# Patient Record
Sex: Male | Born: 1950 | Race: Black or African American | Hispanic: No | Smoking: Former smoker
Health system: Southern US, Community
[De-identification: ages and names within clinical notes are randomized; demographics above are authoritative.]

## PROBLEM LIST (undated history)

## (undated) DIAGNOSIS — E785 Hyperlipidemia, unspecified: Secondary | ICD-10-CM

## (undated) DIAGNOSIS — I10 Essential (primary) hypertension: Secondary | ICD-10-CM

## (undated) DIAGNOSIS — N183 Chronic kidney disease, stage 3 unspecified: Secondary | ICD-10-CM

## (undated) DIAGNOSIS — M1990 Unspecified osteoarthritis, unspecified site: Secondary | ICD-10-CM

## (undated) DIAGNOSIS — E119 Type 2 diabetes mellitus without complications: Secondary | ICD-10-CM

## (undated) HISTORY — DX: Unspecified osteoarthritis, unspecified site: M19.90

## (undated) HISTORY — DX: Essential (primary) hypertension: I10

## (undated) HISTORY — DX: Type 2 diabetes mellitus without complications: E11.9

## (undated) HISTORY — DX: Chronic kidney disease, stage 3 unspecified: N18.30

## (undated) HISTORY — DX: Hyperlipidemia, unspecified: E78.5

---

## 2020-04-10 DIAGNOSIS — M79604 Pain in right leg: Secondary | ICD-10-CM | POA: Insufficient documentation

## 2020-04-10 DIAGNOSIS — R600 Localized edema: Secondary | ICD-10-CM | POA: Insufficient documentation

## 2020-04-10 DIAGNOSIS — L97509 Non-pressure chronic ulcer of other part of unspecified foot with unspecified severity: Secondary | ICD-10-CM | POA: Insufficient documentation

## 2020-05-13 DIAGNOSIS — L89899 Pressure ulcer of other site, unspecified stage: Secondary | ICD-10-CM | POA: Insufficient documentation

## 2020-06-17 ENCOUNTER — Other Ambulatory Visit: Payer: Self-pay

## 2020-06-17 ENCOUNTER — Encounter: Payer: Medicare HMO | Attending: Physician Assistant | Admitting: Physician Assistant

## 2020-06-17 ENCOUNTER — Ambulatory Visit
Admission: RE | Admit: 2020-06-17 | Discharge: 2020-06-17 | Disposition: A | Discharge status: Home / Self Care | Payer: Medicare HMO | Source: Ambulatory Visit | Attending: Physician Assistant | Admitting: Physician Assistant

## 2020-06-17 ENCOUNTER — Other Ambulatory Visit: Payer: Self-pay | Admitting: Physician Assistant

## 2020-06-17 DIAGNOSIS — B999 Unspecified infectious disease: Secondary | ICD-10-CM

## 2020-06-17 DIAGNOSIS — L97522 Non-pressure chronic ulcer of other part of left foot with fat layer exposed: Secondary | ICD-10-CM | POA: Insufficient documentation

## 2020-06-17 DIAGNOSIS — L97528 Non-pressure chronic ulcer of other part of left foot with other specified severity: Secondary | ICD-10-CM | POA: Insufficient documentation

## 2020-06-17 DIAGNOSIS — E1051 Type 1 diabetes mellitus with diabetic peripheral angiopathy without gangrene: Secondary | ICD-10-CM | POA: Diagnosis not present

## 2020-06-17 DIAGNOSIS — E10621 Type 1 diabetes mellitus with foot ulcer: Secondary | ICD-10-CM | POA: Diagnosis present

## 2020-06-17 DIAGNOSIS — I10 Essential (primary) hypertension: Secondary | ICD-10-CM | POA: Insufficient documentation

## 2020-06-17 NOTE — Progress Notes (Signed)
SHIRAZ, BASTYR ([id number]) Visit Report for 06/17/2020 Chief Complaint Document Details Patient Name: William Melton, William Melton Date of Service: 06/17/2020 1:30 PM Medical Record Number: [id number] Patient Account Number: [id number] Date of Birth/Sex: 01-Nov-1950 (69 y.o. M) Treating RN: Huel Coventry Primary Care Provider: Herbert Deaner Other Clinician: Referring Provider: Referral, Self Treating Provider/Extender: Linwood Dibbles, Hisae Decoursey Weeks in Treatment: 0 Information Obtained from: Patient Chief Complaint Left 4th and 5th toe ulcers Electronic Signature(s) Signed: 06/17/2020 2:25:11 PM By: Lenda Kelp PA-C Previous Signature: 06/17/2020 2:15:04 PM Version By: Lenda Kelp PA-C Entered By: Lenda Kelp on 06/17/2020 14:25:11 William Melton ([id number]) -------------------------------------------------------------------------------- HPI Details Patient Name: William Melton Date of Service: 06/17/2020 1:30 PM Medical Record Number: [id number] Patient Account Number: [id number] Date of Birth/Sex: 1951/07/07 (68 y.o. M) Treating RN: Huel Coventry Primary Care Provider: Herbert Deaner Other Clinician: Referring Provider: Referral, Self Treating Provider/Extender: Linwood Dibbles, Lyda Colcord Weeks in Treatment: 0 History of Present Illness HPI Description: 06/17/2020 upon evaluation today patient appears to be doing unfortunately not to well in regard to his left fourth and fifth toes. Unfortunately he does have some evidence of peripheral vascular disease he was noncompressible today. He is also diabetic which makes this even more concerning to be honest. In regard to his toe he actually appears to have what appears most likely a dry gangrene issue though he does have more draining centrally but has been soaking this a lot as well. I do believe that we will get a need to address this as soon as possible I did explain to the patient there is a high likelihood of him losing this toe even with appropriate wound care  measures at this point. Obviously that is something that he really did not want to hear but nonetheless I did not want him to be misinformed about what I thought his chances were in this regard. The patient states he understands but he still hoping he does not lose the toe. In the meantime I did advise that he should discontinue using peroxide which she had already done, discontinue the use of Epson salt soaks which he tells me he would do, and I recommend that he not take a shower or bath where he is going to get this area wet he should have it covered if he is going to do either of those things obviously the bath is just out of the question altogether as I do not want him soaking this. The wrist there is infection and already I think he is at high risk for this. He does have a history of hypertension but tells me that he has no other major medical problems currently. Electronic Signature(s) Signed: 06/17/2020 2:28:01 PM By: Lenda Kelp PA-C Entered By: Lenda Kelp on 06/17/2020 14:28:01 William Melton ([id number]) -------------------------------------------------------------------------------- Physical Exam Details Patient Name: William Melton Date of Service: 06/17/2020 1:30 PM Medical Record Number: [id number] Patient Account Number: [id number] Date of Birth/Sex: Oct 31, 1950 (68 y.o. M) Treating RN: Huel Coventry Primary Care Provider: Herbert Deaner Other Clinician: Referring Provider: Referral, Self Treating Provider/Extender: STONE III, Jamel Holzmann Weeks in Treatment: 0 Constitutional sitting or standing blood pressure is within target range for patient.. pulse regular and within target range for patient.Marland Kitchen respirations regular, non- labored and within target range for patient.Marland Kitchen temperature within target range for patient.. Well-nourished and well-hydrated in no acute distress. Eyes conjunctiva clear no eyelid edema noted. pupils equal round and reactive to light and accommodation. Ears,  Nose, Mouth, and Throat no gross abnormality of ear auricles  or external auditory canals. normal hearing noted during conversation. mucus membranes moist. Respiratory normal breathing without difficulty. Cardiovascular 2+ dorsalis pedis/posterior tibialis pulses. no clubbing, cyanosis, significant edema, <3 sec cap refill. Musculoskeletal normal gait and posture. no significant deformity or arthritic changes, no loss or range of motion, no clubbing. Psychiatric this patient is able to make decisions and demonstrates good insight into disease process. Alert and Oriented x 3. pleasant and cooperative. 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 There is just a small area on the fourth toe that is somewhat eschar covered but does not appear to be doing too poorly in fact I think this is probably of little concern right now. I do believe the patient is going to require some intervention as far as x-rays are concerned I think he needs an x-ray of the foot as far as vascular is concerned I believe he needs ABIs and TBI's along with a arterial study ASAP to evaluate and see where things stand. His capillary refill appeared to be right at 3 seconds though did seem a little sluggish to me in the left foot upon evaluation today. Electronic Signature(s) Signed: 06/17/2020 2:29:24 PM By: Lenda Kelp PA-C Entered By: Lenda Kelp on 06/17/2020 14:29:22 William Melton ([id number]) -------------------------------------------------------------------------------- Physician Orders Details Patient Name: William Melton Date of Service: 06/17/2020 1:30 PM Medical Record Number: [id number] Patient Account Number: [id number] Date of Birth/Sex: [birth year] (68 y.o. M) Treating RN:  Rodell Perna Primary Care Provider: Herbert Deaner Other Clinician: Referring Provider: Referral, Self Treating Provider/Extender: Linwood Dibbles, Djuana Littleton Weeks in Treatment: 0 Verbal / Phone Orders: No Diagnosis Coding ICD-10 Coding Code Description E11.621 Type 2 diabetes mellitus with foot ulcer L97.522 Non-pressure chronic ulcer of other part of left foot with fat layer exposed L97.528 Non-pressure chronic ulcer of other part of left foot with other specified severity I10 Essential (primary) hypertension Wound Cleansing Wound #1 Left Toe Fifth o Clean wound with Normal Saline. Wound #2 Left Toe Fourth o Clean wound with Normal Saline. Primary Wound Dressing Wound #1 Left Toe Fifth o Dry Gauze o Other: - Betadine Wound #2 Left Toe Fourth o Dry Gauze o Other: - Betadine Dressing Change Frequency Wound #1 Left Toe Fifth o Change dressing every day. Wound #2 Left Toe Fourth o Change dressing every day. Follow-up Appointments Wound #1 Left Toe Fifth o Return Appointment in 1 week. Wound #2 Left Toe Fourth o Return Appointment in 1 week. Consults o Vascular - bilateral ABI and TBI ASAP Radiology o X-ray, foot - left foot Electronic Signature(s) Signed: 06/17/2020 3:52:22 PM By: Rodell Perna Signed: 06/17/2020 4:23:41 PM By: Lenda Kelp PA-C Entered By: Rodell Perna on 06/17/2020 14:23:07 William Melton ([id number]) -------------------------------------------------------------------------------- Problem List Details Patient Name: William Melton Date of Service: 06/17/2020 1:30 PM Medical Record Number: [id number] Patient Account Number: [id number] Date of Birth/Sex: [birth year] (68 y.o. M) Treating RN: Huel Coventry Primary Care Provider: Herbert Deaner Other Clinician: Referring Provider: Referral, Self Treating Provider/Extender: Linwood Dibbles, Haiven Nardone Weeks in Treatment: 0 Active Problems ICD-10 Encounter Code Description Active Date  MDM Diagnosis E11.621 Type 2 diabetes mellitus with foot ulcer 06/17/2020 No Yes L97.522 Non-pressure chronic ulcer of other part of left foot with fat layer 06/17/2020 No Yes exposed L97.528 Non-pressure chronic  ulcer of other part of left foot with other specified 06/17/2020 No Yes severity I10 Essential (primary) hypertension 06/17/2020 No Yes Inactive Problems Resolved Problems Electronic Signature(s) Signed: 06/17/2020 2:14:48 PM By: Lenda KelpStone III, Richy Spradley PA-C Entered By: Lenda KelpStone III, Gladys Gutman on 06/17/2020 14:14:48 William Melton, Niranjan ([id number]) -------------------------------------------------------------------------------- Progress Note Details Patient Name: William Melton, William Melton Date of Service: 06/17/2020 1:30 PM Medical Record Number: [id number] Patient Account Number: [id number] Date of Birth/Sex: [birth year] (68 y.o. M) Treating RN: Huel CoventryWoody, Kim Primary Care Provider: Herbert DeanerHARRIS, MEREDITH Other Clinician: Referring Provider: Referral, Self Treating Provider/Extender: Linwood DibblesSTONE III, Ascencion Stegner Weeks in Treatment: 0 Subjective Chief Complaint Information obtained from Patient Left 4th and 5th toe ulcers History of Present Illness (HPI) 06/17/2020 upon evaluation today patient appears to be doing unfortunately not to well in regard to his left fourth and fifth toes. Unfortunately he does have some evidence of peripheral vascular disease he was noncompressible today. He is also diabetic which makes this even more concerning to be honest. In regard to his toe he actually appears to have what appears most likely a dry gangrene issue though he does have more draining centrally but has been soaking this a lot as well. I do believe that we will get a need to address this as soon as possible I did explain to the patient there is a high likelihood of him losing this toe even with appropriate wound care measures at this point. Obviously that is something that he really did not want to hear but nonetheless I did not want him  to be misinformed about what I thought his chances were in this regard. The patient states he understands but he still hoping he does not lose the toe. In the meantime I did advise that he should discontinue using peroxide which she had already done, discontinue the use of Epson salt soaks which he tells me he would do, and I recommend that he not take a shower or bath where he is going to get this area wet he should have it covered if he is going to do either of those things obviously the bath is just out of the question altogether as I do not want him soaking this. The wrist there is infection and already I think he is at high risk for this. He does have a history of hypertension but tells me that he has no other major medical problems currently. Patient History Information obtained from Patient. Allergies No Known Allergies Family History Cancer - Father,Siblings, Hypertension - Father,Siblings, No family history of Diabetes, Heart Disease, Hereditary Spherocytosis, Kidney Disease, Lung Disease, Seizures, Stroke, Thyroid Problems, Tuberculosis. Social History Current every day smoker - 40 years, Marital Status - Widowed, Alcohol Use - Rarely, Drug Use - No History, Caffeine Use - Daily. Medical History Cardiovascular Patient has history of Hypertension Denies history of Angina, Arrhythmia, Congestive Heart Failure, Coronary Artery Disease, Deep Vein Thrombosis, Hypotension, Myocardial Infarction, Peripheral Arterial Disease, Peripheral Venous Disease, Phlebitis, Vasculitis Endocrine Patient has history of Type I Diabetes Musculoskeletal Patient has history of Gout Patient is treated with Insulin. Blood sugar is not tested. Review of Systems (ROS) Constitutional Symptoms (General Health) Denies complaints or symptoms of Fatigue, Fever, Chills, Marked Weight Change. Eyes Denies complaints or symptoms of Dry Eyes, Vision Changes, Glasses / Contacts. Ear/Nose/Mouth/Throat Denies  complaints or symptoms of Difficult clearing ears, Sinusitis. Hematologic/Lymphatic Denies complaints or symptoms of Bleeding / Clotting Disorders, Human Immunodeficiency Virus. Respiratory Denies complaints or symptoms of Chronic or frequent coughs, Shortness of Breath. Cardiovascular Denies complaints  or symptoms of Chest pain, LE edema. Gastrointestinal Denies complaints or symptoms of Frequent diarrhea, Nausea, Vomiting. Endocrine Denies complaints or symptoms of Hepatitis, Thyroid disease, Polydypsia (Excessive Thirst). Genitourinary Denies complaints or symptoms of Kidney failure/ Dialysis, Incontinence/dribbling. Immunological MERRIT, FRIESEN ([id number]) Denies complaints or symptoms of Hives, Itching. Integumentary (Skin) Complains or has symptoms of Wounds. Denies complaints or symptoms of Bleeding or bruising tendency, Breakdown, Swelling. Musculoskeletal Denies complaints or symptoms of Muscle Pain, Muscle Weakness. Neurologic Denies complaints or symptoms of Numbness/parasthesias, Focal/Weakness. Psychiatric Denies complaints or symptoms of Anxiety, Claustrophobia. Objective Constitutional sitting or standing blood pressure is within target range for patient.. pulse regular and within target range for patient.Marland Kitchen respirations regular, non- labored and within target range for patient.Marland Kitchen temperature within target range for patient.. Well-nourished and well-hydrated in no acute distress. Vitals Time Taken: 1:45 PM, Height: 72 in, Weight: 202 lbs, BMI: 27.4, Temperature: 97.8 F, Pulse: 81 bpm, Respiratory Rate: 16 breaths/min, Blood Pressure: 114/77 mmHg. Eyes conjunctiva clear no eyelid edema noted. pupils equal round and reactive to light and accommodation. Ears, Nose, Mouth, and Throat no gross abnormality of ear auricles or external auditory canals. normal hearing noted during conversation. mucus membranes moist. Respiratory normal breathing without  difficulty. Cardiovascular 2+ dorsalis pedis/posterior tibialis pulses. no clubbing, cyanosis, significant edema, Musculoskeletal normal gait and posture. no significant deformity or arthritic changes, no loss or range of motion, no clubbing. Psychiatric this patient is able to make decisions and demonstrates good insight into disease process. Alert and Oriented x 3. pleasant and cooperative. General Notes: Upon inspection patient appears to have mostly dry gangrene of the fifth toe on the left foot although there is some moisture medially I do not see any signs of this worsening dramatically as of yet but I am still concerned about the possibility of a deeper infection. There is just a small area on the fourth toe that is somewhat eschar covered but does not appear to be doing too poorly in fact I think this is probably of little concern right now. I do believe the patient is going to require some intervention as far as x-rays are concerned I think he needs an x-ray of the foot as far as vascular is concerned I believe he needs ABIs and TBI's along with a arterial study ASAP to evaluate and see where things stand. His capillary refill appeared to be right at 3 seconds though did seem a little sluggish to me in the left foot upon evaluation today. Integumentary (Hair, Skin) Wound #1 status is Open. Original cause of wound was Blister. The wound is located on the Left Toe Fifth. The wound measures 2.5cm length x 3.4cm width x 0.1cm depth; 6.676cm^2 area and 0.668cm^3 volume. There is Fat Layer (Subcutaneous Tissue) exposed. There is no tunneling or undermining noted. There is a medium amount of purulent drainage noted. The wound margin is indistinct and nonvisible. There is no granulation within the wound bed. There is a large (67-100%) amount of necrotic tissue within the wound bed including Eschar and Adherent Slough. Wound #2 status is Open. Original cause of wound was Blister. The wound is  located on the Left Toe Fourth. The wound measures 0.2cm length x 0.3cm width x 0.1cm depth; 0.047cm^2 area and 0.005cm^3 volume. There is Fat Layer (Subcutaneous Tissue) exposed. There is no tunneling or undermining noted. There is a medium amount of serosanguineous drainage noted. The wound margin is flat and intact. There is no granulation within the wound bed. There is a  large (67-100%) amount of necrotic tissue within the wound bed including Eschar. Assessment Active Problems ICD-10 Type 2 diabetes mellitus with foot ulcer Non-pressure chronic ulcer of other part of left foot with fat layer exposed Non-pressure chronic ulcer of other part of left foot with other specified severity William Melton ([id number]) Essential (primary) hypertension Plan Wound Cleansing: Wound #1 Left Toe Fifth: Clean wound with Normal Saline. Wound #2 Left Toe Fourth: Clean wound with Normal Saline. Primary Wound Dressing: Wound #1 Left Toe Fifth: Dry Gauze Other: - Betadine Wound #2 Left Toe Fourth: Dry Gauze Other: - Betadine Dressing Change Frequency: Wound #1 Left Toe Fifth: Change dressing every day. Wound #2 Left Toe Fourth: Change dressing every day. Follow-up Appointments: Wound #1 Left Toe Fifth: Return Appointment in 1 week. Wound #2 Left Toe Fourth: Return Appointment in 1 week. Radiology ordered were: X-ray, foot - left foot Consults ordered were: Vascular - bilateral ABI and TBI ASAP 1. I would recommend at this time that we initiate treatment with Betadine painting the toes as well as dry gauze being utilized to try to keep the area as dry as possible this should be done at least once a day twice a day if needed. 2. I am also can recommend at this time that the patient needs to cover the area with dry gauze following in order to ensure this does not get dirty or calls a greater risk for infection going forward. 3. I am also can recommend at this time that the patient needs to wear  the surgical shoe in order to prevent any additional damage to the toe. 4. He should continue and complete his doxycycline in my opinion. 5. I am sending him for an x-ray today of the left foot to evaluate for any bony abnormalities related to osteomyelitis. 6. I am also going to recommend that the patient as soon as possible have arterial studies with ABI and TBI. I am going to see if we can get this scheduled ASAP for the patient. We will see patient back for reevaluation in 1 week here in the clinic. If anything worsens or changes patient will contact our office for additional recommendations. Electronic Signature(s) Signed: 06/17/2020 2:31:26 PM By: Lenda Kelp PA-C Entered By: Lenda Kelp on 06/17/2020 14:31:25 William Melton ([id number]) -------------------------------------------------------------------------------- ROS/PFSH Details Patient Name: William Melton Date of Service: 06/17/2020 1:30 PM Medical Record Number: [id number] Patient Account Number: [id number] Date of Birth/Sex: [birth year] (68 y.o. M) Treating RN: Rodell Perna Primary Care Provider: Herbert Deaner Other Clinician: Referring Provider: Referral, Self Treating Provider/Extender: STONE III, Lauriana Denes Weeks in Treatment: 0 Information Obtained From Patient Constitutional Symptoms (General Health) Complaints and Symptoms: Negative for: Fatigue; Fever; Chills; Marked Weight Change Eyes Complaints and Symptoms: Negative for: Dry Eyes; Vision Changes; Glasses / Contacts Ear/Nose/Mouth/Throat Complaints and Symptoms: Negative for: Difficult clearing ears; Sinusitis Hematologic/Lymphatic Complaints and Symptoms: Negative for: Bleeding / Clotting Disorders; Human Immunodeficiency Virus Respiratory Complaints and Symptoms: Negative for: Chronic or frequent coughs; Shortness of Breath Cardiovascular Complaints and Symptoms: Negative for: Chest pain; LE edema Medical History: Positive for: Hypertension Negative  for: Angina; Arrhythmia; Congestive Heart Failure; Coronary Artery Disease; Deep Vein Thrombosis; Hypotension; Myocardial Infarction; Peripheral Arterial Disease; Peripheral Venous Disease; Phlebitis; Vasculitis Gastrointestinal Complaints and Symptoms: Negative for: Frequent diarrhea; Nausea; Vomiting Endocrine Complaints and Symptoms: Negative for: Hepatitis; Thyroid disease; Polydypsia (Excessive Thirst) Medical History: Positive for: Type I Diabetes Time with diabetes: 20 years Treated with: Insulin Blood sugar tested every day: No Genitourinary Complaints  and Symptoms: Negative for: Kidney failure/ Dialysis; Incontinence/dribbling [place], Peyton Najjar ([id number]) Immunological Complaints and Symptoms: Negative for: Hives; Itching Integumentary (Skin) Complaints and Symptoms: Positive for: Wounds Negative for: Bleeding or bruising tendency; Breakdown; Swelling Musculoskeletal Complaints and Symptoms: Negative for: Muscle Pain; Muscle Weakness Medical History: Positive for: Gout Neurologic Complaints and Symptoms: Negative for: Numbness/parasthesias; Focal/Weakness Psychiatric Complaints and Symptoms: Negative for: Anxiety; Claustrophobia Oncologic Immunizations Pneumococcal Vaccine: Received Pneumococcal Vaccination: No Implantable Devices None Family and Social History Cancer: Yes - Father,Siblings; Diabetes: No; Heart Disease: No; Hereditary Spherocytosis: No; Hypertension: Yes - Father,Siblings; Kidney Disease: No; Lung Disease: No; Seizures: No; Stroke: No; Thyroid Problems: No; Tuberculosis: No; Current every day smoker - 40 years; Marital Status - Widowed; Alcohol Use: Rarely; Drug Use: No History; Caffeine Use: Daily; Financial Concerns: No; Food, Clothing or Shelter Needs: No; Support System Lacking: No; Transportation Concerns: No Electronic Signature(s) Signed: 06/17/2020 3:52:22 PM By: Rodell Perna Signed: 06/17/2020 4:23:41 PM By: Lenda Kelp PA-C Entered  By: Rodell Perna on 06/17/2020 13:59:51 William Melton ([id number]) -------------------------------------------------------------------------------- SuperBill Details Patient Name: William Melton Date of Service: 06/17/2020 Medical Record Number: [id number] Patient Account Number: [id number] Date of Birth/Sex: [birth year] (69 y.o. M) Treating RN: Huel Coventry Primary Care Provider: Herbert Deaner Other Clinician: Referring Provider: Referral, Self Treating Provider/Extender: Linwood Dibbles, Imberly Troxler Weeks in Treatment: 0 Diagnosis Coding ICD-10 Codes Code Description E11.621 Type 2 diabetes mellitus with foot ulcer L97.522 Non-pressure chronic ulcer of other part of left foot with fat layer exposed L97.528 Non-pressure chronic ulcer of other part of left foot with other specified severity I10 Essential (primary) hypertension Facility Procedures CPT4 Code: 08657846 Description: 99214 - WOUND CARE VISIT-LEV 4 EST PT Modifier: Quantity: 1 Physician Procedures CPT4 Code: 9629528 Description: 99204 - WC PHYS LEVEL 4 - NEW PT Modifier: Quantity: 1 CPT4 Code: Description: ICD-10 Diagnosis Description E11.621 Type 2 diabetes mellitus with foot ulcer L97.522 Non-pressure chronic ulcer of other part of left foot with fat layer expo L97.528 Non-pressure chronic ulcer of other part of left foot with other specifie  I10 Essential (primary) hypertension Modifier: sed d severity Quantity: Electronic Signature(s) Signed: 06/17/2020 2:31:42 PM By: Lenda Kelp PA-C Entered By: Lenda Kelp on 06/17/2020 14:31:42

## 2020-06-17 NOTE — Progress Notes (Signed)
[name], CUFF ([id number]) Visit Report for 06/17/2020 Allergy List Details Patient Name: William Melton, William Melton Date of Service: 06/17/2020 1:30 PM Medical Record Number: [id number] Patient Account Number: [id number] Date of Birth/Sex: November 02, 1950 (68 y.o. M) Treating RN: Rodell Perna Primary Care Chantille Navarrete: Herbert Deaner Other Clinician: Referring Adalberto Metzgar: Referral, Self Treating Jeffry Vogelsang/Extender: STONE III, HOYT Weeks in Treatment: 0 Allergies Active Allergies No Known Allergies Allergy Notes Electronic Signature(s) Signed: 06/17/2020 3:52:22 PM By: Rodell Perna Entered By: Rodell Perna on 06/17/2020 13:56:36 William Melton ([id number]) -------------------------------------------------------------------------------- Arrival Information Details Patient Name: William Melton Date of Service: 06/17/2020 1:30 PM Medical Record Number: [id number] Patient Account Number: [id number] Date of Birth/Sex: [birth year] (68 y.o. M) Treating RN: Huel Coventry Primary Care Jackelyne Sayer: Herbert Deaner Other Clinician: Referring Merrianne Mccumbers: Referral, Self Treating Matthan Sledge/Extender: Linwood Dibbles, HOYT Weeks in Treatment: 0 Visit Information Patient Arrived: Cane Arrival Time: 13:48 Accompanied By: self Transfer Assistance: None Patient Identification Verified: Yes Secondary Verification Process Completed: Yes Electronic Signature(s) Signed: 06/17/2020 2:57:07 PM By: Dayton Martes RCP, RRT, CHT Entered By: Dayton Martes on 06/17/2020 13:49:00 William Melton ([id number]) -------------------------------------------------------------------------------- Clinic Level of Care Assessment Details Patient Name: William Melton Date of Service: 06/17/2020 1:30 PM Medical Record Number: [id number] Patient Account Number: [id number] Date of Birth/Sex: [birth year] (68 y.o. M) Treating RN: Rodell Perna Primary Care Irem Stoneham: Herbert Deaner Other Clinician: Referring Pattye Meda: Referral,  Self Treating Darran Gabay/Extender: STONE III, HOYT Weeks in Treatment: 0 Clinic Level of Care Assessment Items TOOL 2 Quantity Score []  - Use when only an EandM is performed on the INITIAL visit 0 ASSESSMENTS - Nursing Assessment / Reassessment X - General Physical Exam (combine w/ comprehensive assessment (listed just below) when performed on new 1 20 pt. evals) X- 1 25 Comprehensive Assessment (HX, ROS, Risk Assessments, Wounds Hx, etc.) ASSESSMENTS - Wound and Skin Assessment / Reassessment []  - Simple Wound Assessment / Reassessment - one wound 0 X- 2 5 Complex Wound Assessment / Reassessment - multiple wounds []  - 0 Dermatologic / Skin Assessment (not related to wound area) ASSESSMENTS - Ostomy and/or Continence Assessment and Care []  - Incontinence Assessment and Management 0 []  - 0 Ostomy Care Assessment and Management (repouching, etc.) PROCESS - Coordination of Care X - Simple Patient / Family Education for ongoing care 1 15 []  - 0 Complex (extensive) Patient / Family Education for ongoing care []  - 0 Staff obtains , Records, Test Results / Process Orders []  - 0 Staff telephones HHA, Nursing Homes / Clarify orders / etc []  - 0 Routine Transfer to another Facility (non-emergent condition) []  - 0 Routine Hospital Admission (non-emergent condition) X- 1 15 New Admissions / / Ordering NPWT, Apligraf, etc. []  - 0 Emergency Hospital Admission (emergent condition) X- 1 10 Simple Discharge Coordination []  - 0 Complex (extensive) Discharge Coordination PROCESS - Special Needs []  - Pediatric / Minor Patient Management 0 []  - 0 Isolation Patient Management []  - 0 Hearing / Language / Visual special needs []  - 0 Assessment of Community assistance (transportation, D/C planning, etc.) []  - 0 Additional assistance / Altered mentation []  - 0 Support Surface(s) Assessment (bed, cushion, seat, etc.) INTERVENTIONS - Wound Cleansing /  Measurement X - Wound Imaging (photographs - any number of wounds) 1 5 []  - 0 Wound Tracing (instead of photographs) []  - 0 Simple Wound Measurement - one wound X- 2 5 Complex Wound Measurement - multiple wounds ( ) []  - 0 Simple Wound Cleansing - one wound X- 2 5 Complex Wound Cleansing - multiple  wounds INTERVENTIONS - Wound Dressings []  - Small Wound Dressing one or multiple wounds 0 X- 2 15 Medium Wound Dressing one or multiple wounds []  - 0 Large Wound Dressing one or multiple wounds []  - 0 Application of Medications - injection INTERVENTIONS - Miscellaneous []  - External ear exam 0 []  - 0 Specimen Collection (cultures, biopsies, blood, body fluids, etc.) []  - 0 Specimen(s) / Culture(s) sent or taken to Lab for analysis []  - 0 Patient Transfer (multiple staff / Lift / Similar devices) []  - 0 Simple Staple / Suture removal (25 or less) []  - 0 Complex Staple / Suture removal (26 or more) []  - 0 Hypo / Hyperglycemic Management (close monitor of Blood Glucose) []  - 0 Ankle / Brachial Index (ABI) - do not check if billed separately Has the patient been seen at the hospital within the last three years: Yes Total Score: 150 Level Of Care: New/Established - Level 4 Electronic Signature(s) Signed: 06/17/2020 3:52:22 PM By: Entered By: on 06/17/2020 14:23:38 ( ) -------------------------------------------------------------------------------- Encounter Discharge Information Details Patient Name: William Melton Date of Service: 06/17/2020 1:30 PM Medical Record Number: Patient Account Number: Date of Birth/Sex: [birth year] (68 y.o. M) Treating RN: Rodell Perna Primary Care Willett Lefeber: Rodell Perna Other Clinician: Referring Rhenda [place]: Referral, Self Treating Primrose Oler/Extender: 08/17/2020, HOYT Weeks in Treatment: 0 Encounter Discharge Information Items Discharge Condition:  Stable Ambulatory Status: Cane Discharge Destination: Home Transportation: Private Auto Accompanied By: self Schedule Follow-up Appointment: Yes Clinical Summary of Care: Electronic Signature(s) Signed: 06/17/2020 3:52:22 PM By: [id number] Entered By: William Melton on 06/17/2020 14:24:26 [id number] ([id number]) -------------------------------------------------------------------------------- Lower Extremity Assessment Details Patient Name: 08/29/1951 Date of Service: 06/17/2020 1:30 PM Medical Record Number: Rodell Perna Patient Account Number: Herbert Deaner Date of Birth/Sex: 1950-11-13 (68 y.o. M) Treating RN: Rodell Perna Primary Care Marcus Schwandt: Rodell Perna Other Clinician: Referring Nava Song: Referral, Self Treating Wiliam Cauthorn/Extender: STONE III, HOYT Weeks in Treatment: 0 Edema Assessment Assessed: [Left: No] [Right: No] Edema: [Left: Yes] [Right: No] Calf Left: Right: Point of Measurement: 30 cm From Medial Instep 33 cm 31 cm Ankle Left: Right: Point of Measurement: 10 cm From Medial Instep 22 cm 19 cm Vascular Assessment Pulses: Dorsalis Pedis Palpable: [Left:Yes] [Right:Yes] Blood Pressure: Brachial: [Right:114] Ankle: [Right:Dorsalis Pedis: 110 0.96] Notes Left leg [place]>220 Electronic Signature(s) Signed: 06/17/2020 3:52:22 PM By: William Melton Entered By: [id number] on 06/17/2020 14:10:50 08/17/2020 ([id number]) -------------------------------------------------------------------------------- Multi Wound Chart Details Patient Name: [id number] Date of Service: 06/17/2020 1:30 PM Medical Record Number: 06-04-1989 Patient Account Number: Rodell Perna Date of Birth/Sex: 10-Apr-1951 (68 y.o. 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 Extremity Comorbid History: Hypertension, Type I Diabetes, Hypertension, Type I Diabetes, N/A Gout Gout Date Acquired: 06/03/2020 06/03/2020 N/A Weeks of Treatment: 0 0 N/A Wound Status: Open Open N/A Pending Amputation on Yes No N/A Presentation: Measurements L x W x D (cm) 2.5x3.4x0.1 0.2x0.3x0.1 N/A Area (cm) : 6.676 0.047 N/A Volume (cm) : 0.668 0.005 N/A Classification: Unable to visualize wound bed Grade 2 N/A Exudate Amount: Medium Medium N/A Exudate Type: Purulent Serosanguineous  N/A Exudate Color: yellow, brown, green red, brown N/A Wound Margin: Indistinct, nonvisible Flat and Intact N/A Granulation Amount: None Present (0%) None Present (0%) N/A Necrotic Amount: Large (67-100%) Large (67-100%) N/A Necrotic Tissue: Eschar, Adherent Slough Eschar N/A Exposed Structures: Fat Layer (Subcutaneous Tissue): Fat Layer (Subcutaneous Tissue): N/A Yes Yes Fascia: No Fascia: No Tendon: No Tendon: No Muscle: No Muscle: No Joint: No Joint: No Bone: No Bone: No Epithelialization: None None N/A Treatment Notes Electronic Signature(s) Signed: 06/17/2020 3:52:22 PM By: Rodell Perna Entered By: Rodell Perna on 06/17/2020 14:16:41 William Melton ([id number]) -------------------------------------------------------------------------------- Multi-Disciplinary Care Plan Details Patient Name: William Melton Date of Service: 06/17/2020 1:30 PM Medical Record Number: [id number] Patient Account Number: [id number] Date of Birth/Sex: [birth year] (68 y.o. M) Treating RN: Rodell Perna Primary Care Ohm Dentler: Herbert Deaner Other Clinician: Referring Ken Bonn: Referral, Self Treating  Latoy Labriola/Extender: Linwood Dibbles, HOYT Weeks in Treatment: 0 Active Inactive Necrotic Tissue Nursing Diagnoses: Impaired tissue integrity related to necrotic/devitalized tissue Goals: Patient/caregiver will verbalize understanding of reason and process for debridement of necrotic tissue Date Initiated: 06/17/2020 Target Resolution Date: 07/11/2020 Goal Status: Active Interventions: Assess patient pain level pre-, during and post procedure and prior to discharge Notes: Orientation to the Wound Care Program Nursing Diagnoses: Knowledge deficit related to the wound healing center program Goals: Patient/caregiver will verbalize understanding of the Wound Healing Center Program Date Initiated: 06/17/2020 Target Resolution Date: 07/11/2020 Goal Status: Active Interventions: Provide education on orientation to the wound center Notes: Wound/Skin Impairment Nursing Diagnoses: Impaired tissue integrity Goals: Ulcer/skin breakdown will have a volume reduction of 30% by week 4 Date Initiated: 06/17/2020 Target Resolution Date: 07/12/2020 Goal Status: Active Interventions: Assess ulceration(s) every visit Notes: Electronic Signature(s) Signed: 06/17/2020 3:52:22 PM By: Rodell Perna Entered By: Rodell Perna on 06/17/2020 14:16:26 William Melton ([id number]) -------------------------------------------------------------------------------- Pain Assessment Details Patient Name: William Melton Date of Service: 06/17/2020 1:30 PM Medical Record Number: [id number] Patient Account Number: [id number] Date of Birth/Sex: 16-Nov-1950 (69 y.o. M) Treating RN: Huel Coventry Primary Care Taariq Leitz: Herbert Deaner Other Clinician: Referring Tiffine Henigan: Referral, Self Treating Jonathan Kirkendoll/Extender: STONE III, HOYT Weeks in Treatment: 0 Active Problems Location of Pain Severity and Description of Pain Patient Has Paino Yes Site Locations Rate the pain. Current Pain Level: 5 Pain Management and  Medication Current Pain Management: Electronic Signature(s) Signed: 06/17/2020 2:57:07 PM By: Dayton Martes RCP, RRT, CHT Signed: 06/17/2020 4:42:00 PM By: Elliot Gurney, BSN, RN, CWS, Kim RN, BSN Entered By: Dayton Martes on 06/17/2020 13:49:21 William Melton ([id number]) -------------------------------------------------------------------------------- Patient/Caregiver Education Details Patient Name: William Melton Date of Service: 06/17/2020 1:30 PM Medical Record Number: [id number] Patient Account Number: [id number] Date of Birth/Gender: 07-16-1951 (68 y.o. M) Treating RN: Rodell Perna Primary Care Physician: Herbert Deaner Other Clinician: Referring Physician: Referral, Self Treating Physician/Extender: Linwood Dibbles, HOYT Weeks in Treatment: 0 Education Assessment Education Provided To: Patient Education Topics Provided Wound/Skin Impairment: Handouts: Caring for Your Ulcer Methods: Demonstration, Explain/Verbal Responses: State content correctly Electronic Signature(s) Signed: 06/17/2020 3:52:22 PM By: Rodell Perna Entered By: Rodell Perna on 06/17/2020 14:23:50 William Melton ([id number]) -------------------------------------------------------------------------------- Wound Assessment Details Patient Name: William Melton Date of Service: 06/17/2020 1:30 PM Medical Record Number: [id number] Patient Account Number: [id number] Date of Birth/Sex: 16-Sep-1950 (68 y.o. M) Treating RN: Rodell Perna Primary Care Abena Erdman: Herbert Deaner Other Clinician: Referring Merline Perkin: Referral, Self Treating Aniel Hubble/Extender: STONE III, HOYT Weeks in Treatment: 0 Wound Status Wound Number: 1 Primary Etiology: Diabetic Wound/Ulcer of the Lower Extremity Wound Location: Left Toe Fifth Wound Status: Open Wounding Event: Blister  Comorbid History: Hypertension, Type I Diabetes, Gout Date Acquired: 05/03/2020 Weeks Of Treatment: 0 Clustered Wound: No Pending Amputation On  Presentation Photos Wound Measurements Length: (cm) 2.5 % R Width: (cm) 3.4 % R Depth: (cm) 0.1 Epi Area: (cm) 6.676 Tu Volume: (cm) 0.668 Un eduction in Area: 0% eduction in Volume: 0% thelialization: None nneling: No dermining: No Wound Description Classification: Grade 4 Fou Wound Margin: Indistinct, nonvisible Slo Exudate Amount: Medium Exudate Type: Purulent Exudate Color: yellow, brown, green l Odor After Cleansing: No ugh/Fibrino Yes Wound Bed Granulation Amount: None Present (0%) Exposed Structure Necrotic Amount: Large (67-100%) Fascia Exposed: No Necrotic Quality: Eschar, Adherent Slough Fat Layer (Subcutaneous Tissue) Exposed: Yes Tendon Exposed: No Muscle Exposed: No Joint Exposed: No Bone Exposed: No Treatment Notes Wound #1 (Left Toe Fifth) Notes betadine, gauze William SergeantJONES, Braxton ([id number]) Electronic Signature(s) Signed: 06/17/2020 2:26:39 PM By: Lenda KelpStone III, Hoyt PA-C Signed: 06/17/2020 3:52:22 PM By: Rodell PernaScott, Dajea Entered By: Lenda KelpStone III, Hoyt on 06/17/2020 14:26:38 William SergeantJONES, Xander ([id number]) -------------------------------------------------------------------------------- Wound Assessment Details Patient Name: William SergeantJONES, Grae Date of Service: 06/17/2020 1:30 PM Medical Record Number: [id number] Patient Account Number: [id number] Date of Birth/Sex: [birth year] (68 y.o. M) Treating RN: Rodell PernaScott, Dajea Primary Care Jadesola Poynter: Herbert DeanerHARRIS, MEREDITH Other Clinician: Referring Knut Rondinelli: Referral, Self Treating Yuridiana Formanek/Extender: STONE III, HOYT Weeks in Treatment: 0 Wound Status Wound Number: 2 Primary Etiology: Diabetic Wound/Ulcer of the Lower Extremity Wound Location: Left Toe Third Wound Status: Open Wounding Event: Blister Comorbid History: Hypertension, Type I Diabetes, Gout Date Acquired: 06/03/2020 Weeks Of Treatment: 0 Clustered Wound: No Photos Wound Measurements Length: (cm) 0.2 Width: (cm) 0.3 Depth: (cm) 0.1 Area: (cm) 0.047 Volume: (cm) 0.005 %  Reduction in Area: % Reduction in Volume: Epithelialization: None Tunneling: No Undermining: No Wound Description Classification: Grade 2 Wound Margin: Flat and Intact Exudate Amount: Medium Exudate Type: Serosanguineous Exudate Color: red, brown Foul Odor After Cleansing: No Slough/Fibrino Yes Wound Bed Granulation Amount: None Present (0%) Exposed Structure Necrotic Amount: Large (67-100%) Fascia Exposed: No Necrotic Quality: Eschar Fat Layer (Subcutaneous Tissue) Exposed: Yes Tendon Exposed: No Muscle Exposed: No Joint Exposed: No Bone Exposed: No Electronic Signature(s) Signed: 06/17/2020 3:52:22 PM By: Rodell PernaScott, Dajea Entered By: Rodell PernaScott, Dajea on 06/17/2020 14:05:08 William SergeantJONES, Nevan ([id number]) -------------------------------------------------------------------------------- Vitals Details Patient Name: William SergeantJONES, Avondre Date of Service: 06/17/2020 1:30 PM Medical Record Number: [id number] Patient Account Number: [id number] Date of Birth/Sex: [birth year] (69 y.o. M) Treating RN: Huel CoventryWoody, Kim Primary Care Camala Talwar: Herbert DeanerHARRIS, MEREDITH Other Clinician: Referring Teyana Pierron: Referral, Self Treating Desree Leap/Extender: STONE III, HOYT Weeks in Treatment: 0 Vital Signs Time Taken: 13:45 Temperature (F): 97.8 Height (in): 72 Pulse (bpm): 81 Weight (lbs): 202 Respiratory Rate (breaths/min): 16 Body Mass Index (BMI): 27.4 Blood Pressure (mmHg): 114/77 Reference Range: 80 - 120 mg / dl Electronic Signature(s) Signed: 06/17/2020 2:57:07 PM By: Dayton MartesWallace, RCP,RRT,CHT, Sallie RCP, RRT, CHT Entered By: Dayton MartesWallace, RCP,RRT,CHT, Sallie on 06/17/2020 13:50:01

## 2020-06-17 NOTE — Progress Notes (Signed)
AYEDEN, GLADMAN ([id number]) Visit Report for 06/17/2020 Abuse/Suicide Risk Screen Details Patient Name: William Melton, William Melton Date of Service: 06/17/2020 1:30 PM Medical Record Number: [id number] Patient Account Number: [id number] Date of Birth/Sex: 09/08/1950 (68 y.o. M) Treating RN: Rodell Perna Primary Care Caymen Dubray: Herbert Deaner Other Clinician: Referring Itzae Mccurdy: Referral, Self Treating Josphine Laffey/Extender: STONE III, HOYT Weeks in Treatment: 0 Abuse/Suicide Risk Screen Items Answer ABUSE RISK SCREEN: Has anyone close to you tried to hurt or harm you recentlyo No Do you feel uncomfortable with anyone in your familyo No Has anyone forced you do things that you didnot want to doo No Electronic Signature(s) Signed: 06/17/2020 3:52:22 PM By: Rodell Perna Entered By: Rodell Perna on 06/17/2020 14:00:00 William Melton ([id number]) -------------------------------------------------------------------------------- Activities of Daily Living Details Patient Name: William Melton Date of Service: 06/17/2020 1:30 PM Medical Record Number: [id number] Patient Account Number: [id number] Date of Birth/Sex: 02-03-1951 (68 y.o. M) Treating RN: Rodell Perna Primary Care Berda Shelvin: Herbert Deaner Other Clinician: Referring Anetta Olvera: Referral, Self Treating Lylia Karn/Extender: STONE III, HOYT Weeks in Treatment: 0 Activities of Daily Living Items Answer Activities of Daily Living (Please select one for each item) Drive Automobile Completely Able Take Medications Completely Able Use Telephone Completely Able Care for Appearance Completely Able Use Toilet Completely Able Bath / Shower Completely Able Dress Self Completely Able Feed Self Completely Able Walk Completely Able Get In / Out Bed Completely Able Housework Completely Able Prepare Meals Completely Able Handle Money Completely Able Shop for Self Completely Able Electronic Signature(s) Signed: 06/17/2020 3:52:22 PM By: Rodell Perna Entered By:  Rodell Perna on 06/17/2020 14:00:09 William Melton ([id number]) -------------------------------------------------------------------------------- Education Screening Details Patient Name: William Melton Date of Service: 06/17/2020 1:30 PM Medical Record Number: [id number] Patient Account Number: [id number] Date of Birth/Sex: May 29, 1951 (68 y.o. M) Treating RN: Rodell Perna Primary Care Sukari Grist: Herbert Deaner Other Clinician: Referring Porscha Axley: Referral, Self Treating Toby Breithaupt/Extender: Linwood Dibbles, HOYT Weeks in Treatment: 0 Primary Learner Assessed: Patient Learning Preferences/Education Level/Primary Language Learning Preference: Explanation Highest Education Level: High School Preferred Language: English Cognitive Barrier Language Barrier: No Translator Needed: No Memory Deficit: No Emotional Barrier: No Cultural/Religious Beliefs Affecting Medical Care: No Physical Barrier Impaired Vision: No Impaired Hearing: No Decreased Hand dexterity: No Knowledge/Comprehension Knowledge Level: High Comprehension Level: High Ability to understand written instructions: High Ability to understand verbal instructions: High Motivation Anxiety Level: Calm Cooperation: Cooperative Education Importance: Acknowledges Need Interest in Health Problems: Asks Questions Perception: Coherent Willingness to Engage in Self-Management High Activities: Readiness to Engage in Self-Management High Activities: Electronic Signature(s) Signed: 06/17/2020 3:52:22 PM By: Rodell Perna Entered By: Rodell Perna on 06/17/2020 14:00:38 William Melton ([id number]) -------------------------------------------------------------------------------- Fall Risk Assessment Details Patient Name: William Melton Date of Service: 06/17/2020 1:30 PM Medical Record Number: [id number] Patient Account Number: [id number] Date of Birth/Sex: 07/15/1951 (68 y.o. M) Treating RN: Rodell Perna Primary Care Destyne Goodreau: Herbert Deaner  Other Clinician: Referring Eldor Conaway: Referral, Self Treating Raynelle Fujikawa/Extender: STONE III, HOYT Weeks in Treatment: 0 Fall Risk Assessment Items Have you had 2 or more falls in the last 12 monthso 0 No Have you had any fall that resulted in injury in the last 12 monthso 0 No FALLS RISK SCREEN History of falling - immediate or within 3 months 0 No Secondary diagnosis (Do you have 2 or more medical diagnoseso) 0 No Ambulatory aid None/bed rest/wheelchair/nurse 0 No Crutches/cane/walker 15 Yes Furniture 0 No Intravenous therapy Access/Saline/Heparin Lock 0 No Gait/Transferring Normal/ bed rest/ wheelchair 0 No Weak (short steps with or without shuffle, stooped but able to lift  head while walking, may 0 No seek support from furniture) Impaired (short steps with shuffle, may have difficulty arising from chair, head down, impaired 0 No balance) Mental Status Oriented to own ability 0 No Electronic Signature(s) Signed: 06/17/2020 3:52:22 PM By: Rodell Perna Entered By: Rodell Perna on 06/17/2020 14:00:46 William Melton ([id number]) -------------------------------------------------------------------------------- Foot Assessment Details Patient Name: William Melton Date of Service: 06/17/2020 1:30 PM Medical Record Number: [id number] Patient Account Number: [id number] Date of Birth/Sex: Jan 24, 1951 (68 y.o. M) Treating RN: Rodell Perna Primary Care Bianna Haran: Herbert Deaner Other Clinician: Referring Avis Mcmahill: Referral, Self Treating Laisa Larrick/Extender: STONE III, HOYT Weeks in Treatment: 0 Foot Assessment Items Site Locations + = Sensation present, - = Sensation absent, C = Callus, U = Ulcer R = Redness, W = Warmth, M = Maceration, PU = Pre-ulcerative lesion F = Fissure, S = Swelling, D = Dryness Assessment Right: Left: Other Deformity: No No Prior Foot Ulcer: No No Prior Amputation: No No Charcot Joint: No No Ambulatory Status: Ambulatory With Help Assistance Device: Cane Gait:  Steady Electronic Signature(s) Signed: 06/17/2020 3:52:22 PM By: Rodell Perna Entered By: Rodell Perna on 06/17/2020 14:02:42 William Melton ([id number]) -------------------------------------------------------------------------------- Nutrition Risk Screening Details Patient Name: William Melton Date of Service: 06/17/2020 1:30 PM Medical Record Number: [id number] Patient Account Number: [id number] Date of Birth/Sex: 1950/11/02 (68 y.o. M) Treating RN: Rodell Perna Primary Care Lasaro Primm: Herbert Deaner Other Clinician: Referring Corban Kistler: Referral, Self Treating Kurtis Anastasia/Extender: STONE III, HOYT Weeks in Treatment: 0 Height (in): 72 Weight (lbs): 202 Body Mass Index (BMI): 27.4 Nutrition Risk Screening Items Score Screening NUTRITION RISK SCREEN: I have an illness or condition that made me change the kind and/or amount of food I eat 0 No I eat fewer than two meals per day 0 No I eat few fruits and vegetables, or milk products 0 No I have three or more drinks of beer, liquor or wine almost every day 0 No I have tooth or mouth problems that make it hard for me to eat 0 No I don't always have enough money to buy the food I need 0 No I eat alone most of the time 0 No I take three or more different prescribed or over-the-counter drugs a day 0 No Without wanting to, I have lost or gained 10 pounds in the last six months 0 No I am not always physically able to shop, cook and/or feed myself 0 No Nutrition Protocols Good Risk Protocol 0 No interventions needed Moderate Risk Protocol High Risk Proctocol Risk Level: Good Risk Score: 0 Electronic Signature(s) Signed: 06/17/2020 3:52:22 PM By: Rodell Perna Entered By: Rodell Perna on 06/17/2020 14:00:51

## 2020-06-18 ENCOUNTER — Other Ambulatory Visit (INDEPENDENT_AMBULATORY_CARE_PROVIDER_SITE_OTHER): Payer: Self-pay | Admitting: Physician Assistant

## 2020-06-18 ENCOUNTER — Other Ambulatory Visit (INDEPENDENT_AMBULATORY_CARE_PROVIDER_SITE_OTHER): Payer: Self-pay | Admitting: Vascular Surgery

## 2020-06-18 DIAGNOSIS — I96 Gangrene, not elsewhere classified: Secondary | ICD-10-CM

## 2020-06-19 ENCOUNTER — Ambulatory Visit (INDEPENDENT_AMBULATORY_CARE_PROVIDER_SITE_OTHER): Payer: Medicare HMO

## 2020-06-19 ENCOUNTER — Ambulatory Visit (INDEPENDENT_AMBULATORY_CARE_PROVIDER_SITE_OTHER): Payer: Medicare HMO | Admitting: Vascular Surgery

## 2020-06-19 ENCOUNTER — Other Ambulatory Visit: Payer: Self-pay

## 2020-06-19 ENCOUNTER — Encounter (INDEPENDENT_AMBULATORY_CARE_PROVIDER_SITE_OTHER): Payer: Self-pay | Admitting: Vascular Surgery

## 2020-06-19 DIAGNOSIS — I10 Essential (primary) hypertension: Secondary | ICD-10-CM | POA: Diagnosis not present

## 2020-06-19 DIAGNOSIS — I70262 Atherosclerosis of native arteries of extremities with gangrene, left leg: Secondary | ICD-10-CM | POA: Diagnosis not present

## 2020-06-19 DIAGNOSIS — E1152 Type 2 diabetes mellitus with diabetic peripheral angiopathy with gangrene: Secondary | ICD-10-CM

## 2020-06-19 DIAGNOSIS — I96 Gangrene, not elsewhere classified: Secondary | ICD-10-CM

## 2020-06-20 ENCOUNTER — Encounter (INDEPENDENT_AMBULATORY_CARE_PROVIDER_SITE_OTHER): Payer: Self-pay | Admitting: Vascular Surgery

## 2020-06-20 DIAGNOSIS — E119 Type 2 diabetes mellitus without complications: Secondary | ICD-10-CM | POA: Insufficient documentation

## 2020-06-20 DIAGNOSIS — I10 Essential (primary) hypertension: Secondary | ICD-10-CM | POA: Insufficient documentation

## 2020-06-20 DIAGNOSIS — I70269 Atherosclerosis of native arteries of extremities with gangrene, unspecified extremity: Secondary | ICD-10-CM | POA: Insufficient documentation

## 2020-06-20 NOTE — Progress Notes (Signed)
MRN : [id number]  William Melton is a 69 y.o. (08/04/1951) male who presents with chief complaint of  Chief Complaint  Patient presents with  . New Patient (Initial Visit)    Stone. stat. abi gangrene 5th toe of lt foot ,abi consult  .  History of Present Illness:   The patient is seen for evaluation of painful lower extremities and diminished pulses associated with ulceration of the foot.  The patient notes the ulcer has been present for multiple weeks and has not been improving.  It is very painful and has had some drainage.  No specific history of trauma noted by the patient.  The patient denies fever or chills.  the patient does have diabetes which has been difficult to control.  Patient notes prior to the ulcer developing the extremities were painful particularly with ambulation or activity and the discomfort is very consistent day today. Typically, the pain occurs at less than one block, progress is as activity continues to the point that the patient must stop walking. Resting including standing still for several minutes allowed resumption of the activity and the ability to walk a similar distance before stopping again. Uneven terrain and inclined shorten the distance. The pain has been progressive over the past several years.   The patient denies rest pain or dangling of an extremity off the side of the bed during the night for relief. No prior interventions or surgeries.  No history of back problems or DJD of the lumbar sacral spine.   The patient denies amaurosis fugax or recent TIA symptoms. There are no recent neurological changes noted. The patient denies history of DVT, PE or superficial thrombophlebitis. The patient denies recent episodes of angina or shortness of breath.   Current Meds  Medication Sig  . allopurinol (ZYLOPRIM) 100 MG tablet allopurinol 100 mg tablet  TAKE 1 TABLET BY MOUTH ONCE DAILY  . amLODipine (NORVASC) 10 MG tablet amlodipine 10 mg tablet  TAKE 1  TABLET BY MOUTH ONCE DAILY  . insulin glargine (LANTUS) 100 UNIT/ML injection Lantus U-100 Insulin 100 unit/mL subcutaneous solution  INJECT 50 UNITS SUBCUTANEOUSLY ONCE DAILY IN THE MORNING AS DIRECTED  . levofloxacin (LEVAQUIN) 500 MG tablet levofloxacin 500 mg tablet  TAKE 1 TABLET BY MOUTH ONCE DAILY FOR 14 DAYS    Past Medical History:  Diagnosis Date  . Arthritis   . Diabetes mellitus without complication (HCC)   . Hyperlipidemia   . Hypertension       Social History Social History   Tobacco Use  . Smoking status: Not on file  Substance Use Topics  . Alcohol use: Not on file  . Drug use: Not on file    Family History Family History  Problem Relation Age of Onset  . Heart disease Mother   . COPD Father   . Hyperlipidemia Father   No family history of bleeding/clotting disorders, porphyria or autoimmune disease   Not on File   REVIEW OF SYSTEMS (Negative unless checked)  Constitutional: [] Weight loss  [] Fever  [] Chills Cardiac: [] Chest pain   [] Chest pressure   [] Palpitations   [] Shortness of breath when laying flat   [] Shortness of breath with exertion. Vascular:  [x] Pain in legs with walking   [x] Pain in legs at rest  [] History of DVT   [] Phlebitis   [] Swelling in legs   [] Varicose veins   [x] Non-healing ulcers Pulmonary:   [] Uses home oxygen   [] Productive cough   [] Hemoptysis   [] Wheeze  [] COPD   []   Asthma Neurologic:  [] Dizziness   [] Seizures   [] History of stroke   [] History of TIA  [] Aphasia   [] Vissual changes   [] Weakness or numbness in arm   [] Weakness or numbness in leg Musculoskeletal:   [x] Joint swelling   [] Joint pain   [] Low back pain Hematologic:  [] Easy bruising  [] Easy bleeding   [] Hypercoagulable state   [] Anemic Gastrointestinal:  [] Diarrhea   [] Vomiting  [] Gastroesophageal reflux/heartburn   [] Difficulty swallowing. Genitourinary:  [] Chronic kidney disease   [] Difficult urination  [] Frequent urination   [] Blood in urine Skin:  [] Rashes    [x] Ulcers  Psychological:  [] History of anxiety   []  History of major depression.  Physical Examination  Vitals:   06/19/20 1021  BP: (!) 175/107  Pulse: (!) 106  Weight: 192 lb (87.1 kg)  Height: 6' (1.829 m)   Body mass index is 26.04 kg/m. Gen: WD/WN, NAD Head: [place]/AT, No temporalis wasting.  Ear/Nose/Throat: Hearing grossly intact, nares w/o erythema or drainage, poor dentition Eyes: PER, EOMI, sclera nonicteric.  Neck: Supple, no masses.  No bruit or JVD.  Pulmonary:  Good air movement, clear to auscultation bilaterally, no use of accessory muscles.  Cardiac: RRR, normal S1, S2, no Murmurs. Vascular: gangrene of the 4th toe and the 5th toes Vessel Right Left  Radial Palpable Palpable  PT Not Palpable Not Palpable  DP Not Palpable Not Palpable  Gastrointestinal: soft, non-distended. No guarding/no peritoneal signs.  Musculoskeletal: M/S 5/5 throughout.  No deformity or atrophy.  Neurologic: CN 2-12 intact. Pain and light touch intact in extremities.  Symmetrical.  Speech is fluent. Motor exam as listed above. Psychiatric: Judgment intact, Mood & affect appropriate for pt's clinical situation. Dermatologic: No rashes or ulcers noted.  No changes consistent with cellulitis.  CBC No results found for: WBC, HGB, HCT, MCV, PLT  BMET No results found for: NA, K, CL, CO2, GLUCOSE, BUN, CREATININE, CALCIUM, GFRNONAA, GFRAA CrCl cannot be calculated (No successful lab value found.).  COAG No results found for: INR, PROTIME  Radiology DG Foot Complete Left  Result Date: 06/19/2020 CLINICAL DATA:  Left foot infection. Foot infection in the fourth and fifth toes for 2 months. EXAM: LEFT FOOT - COMPLETE 3+ VIEW COMPARISON:  None. FINDINGS: Fracture of the great toe proximal phalanx which involves the interphalangeal joint is likely chronic and has well-defined margins. There is also a small erosion involving the medial aspect of the first proximal phalanx at the interphalangeal  joint. A dressing overlies the lateral foot in the region of the fifth proximal phalanx. There is no definite subjacent bony destruction or erosions to suggest osteomyelitis. No radiopaque foreign body or tracking soft tissue air. Plantar calcaneal spur and Achilles tendon enthesophyte. There are advanced vascular calcifications. IMPRESSION: 1. No definite radiographic findings of osteomyelitis. A dressing overlies the lateral foot in the region of the fifth proximal phalanx which is likely site of wound. 2. Fracture of the great toe proximal phalanx which involves the interphalangeal joint is likely chronic. Small erosion involving the medial aspect of the first proximal phalanx at the interphalangeal joint, nonspecific but may represent gout. 3. Advanced vascular calcifications. Electronically Signed   By: M.D.   On: 06/19/2020 14:01   VAS ABI WITH/WO TBI  Result Date: 06/19/2020 LOWER EXTREMITY DOPPLER STUDY Indications: Gangrene, and Left 5th Toe Gangrene.  Performing Technologist: RVS  Examination Guidelines: A complete evaluation includes at minimum, Doppler waveform signals and systolic blood pressure reading at the level  of bilateral brachial, anterior tibial, and posterior tibial arteries, when vessel segments are accessible. Bilateral testing is considered an integral part of a complete examination. Photoelectric Plethysmograph (PPG) waveforms and toe systolic pressure readings are included as required and additional duplex testing as needed. Limited examinations for reoccurring indications may be performed as noted.  ABI Findings: +---------+------------------+-----+----------+--------+ Right    Rt Pressure (mmHg)IndexWaveform  Comment  +---------+------------------+-----+----------+--------+ Brachial 177                                       +---------+------------------+-----+----------+--------+ ATA      250               1.37 monophasicNC        +---------+------------------+-----+----------+--------+ PTA      250               1.37 monophasicNC       +---------+------------------+-----+----------+--------+ Great Toe115               0.63 Abnormal           +---------+------------------+-----+----------+--------+ +---------+------------------+-----+----------+-------+ Left     Lt Pressure (mmHg)IndexWaveform  Comment +---------+------------------+-----+----------+-------+ Brachial 182                                      +---------+------------------+-----+----------+-------+ ATA      102               0.56 monophasic        +---------+------------------+-----+----------+-------+ PTA      119               0.65 monophasic        +---------+------------------+-----+----------+-------+ Great Toe52                0.29 Abnormal          +---------+------------------+-----+----------+-------+ +-------+-----------+-----------+------------+------------+ ABI/TBIToday's ABIToday's TBIPrevious ABIPrevious TBI +-------+-----------+-----------+------------+------------+ Right  >1.0     .63                                 +-------+-----------+-----------+------------+------------+ Left   .65        .29                                 +-------+-----------+-----------+------------+------------+  Summary: Right: Resting right ankle-brachial index indicates noncompressible right lower extremity arteries. The right toe-brachial index is abnormal. Left: Resting left ankle-brachial index indicates moderate left lower extremity arterial disease. The left toe-brachial index is abnormal.  *See table(s) above for measurements and observations.  Electronically signed by [name] MD on 06/19/2020 at 4:54:33 PM.   Final      Assessment/Plan 1. Atherosclerosis of native artery of left lower extremity with gangrene (HCC)  Recommend:  The patient has evidence of severe atherosclerotic changes of both lower  extremities associated with ulceration and tissue loss of the left foot.  This represents a limb threatening ischemia and places the patient at the risk for left  limb loss.  Patient should undergo angiography of the left lower extremities with the hope for intervention for left  limb salvage.  The risks and benefits as well as the alternative therapies was discussed in detail with the patient.  All questions  were answered.  Patient agrees to proceed with left leg angiography.  The patient will follow up with me in the office after the procedure.    2. Essential hypertension Continue antihypertensive medications as already ordered, these medications have been reviewed and there are no changes at this time.   3. Type 2 diabetes mellitus with diabetic peripheral angiopathy with gangrene, unspecified whether long term insulin use (HCC) Continue hypoglycemic medications as already ordered, these medications have been reviewed and there are no changes at this time.  Hgb A1C to be monitored as already arranged by primary service     [name], MD  06/20/2020 12:56 PM

## 2020-06-24 ENCOUNTER — Telehealth (INDEPENDENT_AMBULATORY_CARE_PROVIDER_SITE_OTHER): Payer: Self-pay

## 2020-06-24 ENCOUNTER — Encounter: Payer: Medicare HMO | Admitting: Physician Assistant

## 2020-06-24 ENCOUNTER — Other Ambulatory Visit: Payer: Self-pay

## 2020-06-24 DIAGNOSIS — E10621 Type 1 diabetes mellitus with foot ulcer: Secondary | ICD-10-CM | POA: Diagnosis not present

## 2020-06-24 NOTE — Telephone Encounter (Signed)
Spoke with the patient and he is scheduled with Dr. Gilda Crease for a left leg angio on 07/01/20 with a 9:00 am arrival time to the [facility] Covid testing on 06/27/20 between 8-1 pm at the [facility]. Pre-procedure instructions were discussed and will be mailed.

## 2020-06-24 NOTE — Progress Notes (Addendum)
William Melton, William Melton ([id number]) Visit Report for 06/24/2020 Chief Complaint Document Details Patient Name: William Melton Date of Service: 06/24/2020 10:00 AM Medical Record Number: [id number] Patient Account Number: [id number] Date of Birth/Sex: 10/13/1950 (68 y.o. M) Treating RN: Huel Coventry Primary Care Provider: Herbert Deaner Other Clinician: Referring Provider: Herbert Deaner Treating Provider/Extender: Linwood Dibbles, Erica Osuna Weeks in Treatment: 1 Information Obtained from: Patient Chief Complaint Left 4th and 5th toe ulcers Electronic Signature(s) Signed: 06/24/2020 10:37:51 AM By: Lenda Kelp PA-C Entered By: Lenda Kelp on 06/24/2020 10:37:51 William Melton ([id number]) -------------------------------------------------------------------------------- HPI Details Patient Name: William Melton Date of Service: 06/24/2020 10:00 AM Medical Record Number: [id number] Patient Account Number: [id number] Date of Birth/Sex: 21-Dec-1950 (68 y.o. M) Treating RN: Huel Coventry Primary Care Provider: Herbert Deaner Other Clinician: Referring Provider: Herbert Deaner Treating Provider/Extender: Linwood Dibbles, Promiss Labarbera Weeks in Treatment: 1 History of Present Illness HPI Description: 06/17/2020 upon evaluation today patient appears to be doing unfortunately not to well in regard to his left fourth and fifth toes. Unfortunately he does have some evidence of peripheral vascular disease he was noncompressible today. He is also diabetic which makes this even more concerning to be honest. In regard to his toe he actually appears to have what appears most likely a dry gangrene issue though he does have more draining centrally but has been soaking this a lot as well. I do believe that we will get a need to address this as soon as possible I did explain to the patient there is a high likelihood of him losing this toe even with appropriate wound care measures at this point. Obviously that is something that he really  did not want to hear but nonetheless I did not want him to be misinformed about what I thought his chances were in this regard. The patient states he understands but he still hoping he does not lose the toe. In the meantime I did advise that he should discontinue using peroxide which she had already done, discontinue the use of Epson salt soaks which he tells me he would do, and I recommend that he not take a shower or bath where he is going to get this area wet he should have it covered if he is going to do either of those things obviously the bath is just out of the question altogether as I do not want him soaking this. The wrist there is infection and already I think he is at high risk for this. He does have a history of hypertension but tells me that he has no other major medical problems currently. 06/24/2020 upon evaluation today patient appears to be doing okay at this time in regard to his left fourth and fifth toe ulcerations. At this point he is really not doing any worse also not really doing any better. He did have arterial studies performed with vein and vascular and subsequently this shows that he has poor arterial flow in the left especially and he is actually slated to have angioplasty and intervention by Dr. Gilda Crease. With that being said I do not see any signs at this point of active infection which is good news were still using Betadine at this time. He does not have an appointment date per Dr. Gilda Crease when he is can have the intervention as of yet. Electronic Signature(s) Signed: 06/24/2020 2:39:19 PM By: Lenda Kelp PA-C Entered By: Lenda Kelp on 06/24/2020 14:39:18 William Melton ([id number]) -------------------------------------------------------------------------------- Physical Exam Details Patient Name: William Melton Date of Service: 06/24/2020  10:00 AM Medical Record Number: [id number] Patient Account Number: [id number] Date of Birth/Sex: [birth year] (68 y.o.  M) Treating RN: Huel CoventryWoody, Kim Primary Care Provider: Herbert DeanerHARRIS, MEREDITH Other Clinician: Referring Provider: Herbert DeanerHARRIS, MEREDITH Treating Provider/Extender: Linwood DibblesSTONE III, Haleemah Buckalew Weeks in Treatment: 1 Constitutional Well-nourished and well-hydrated in no acute distress. Respiratory normal breathing without difficulty. Psychiatric this patient is able to make decisions and demonstrates good insight into disease process. Alert and Oriented x 3. pleasant and cooperative. Notes Upon inspection patient's wound bed actually showed signs again of being stable on the outside between the fourth and fifth toe on the left foot there did appear to be some evidence of moisture breakdown but again it is difficult to tell the exact extent and with the patient's poor flow I am not going to perform any debridement whatsoever at this time. I did review the x-ray shows a chronic fracture of the great toe but nothing new or there was some evidence of potential gout no signs of osteomyelitis noted on x-ray. Electronic Signature(s) Signed: 06/24/2020 2:39:50 PM By: Lenda KelpStone III, Lesle Faron PA-C Entered By: Lenda KelpStone III, Zaynah Chawla on 06/24/2020 14:39:50 William Melton ([id number]) -------------------------------------------------------------------------------- Physician Orders Details Patient Name: William Melton Date of Service: 06/24/2020 10:00 AM Medical Record Number: [id number] Patient Account Number: [id number] Date of Birth/Sex: [birth year] (68 y.o. M) Treating RN: Yevonne PaxEpps, Carrie Primary Care Provider: Herbert DeanerHARRIS, MEREDITH Other Clinician: Referring Provider: Herbert DeanerHARRIS, MEREDITH Treating Provider/Extender: Linwood DibblesSTONE III, Neyla Gauntt Weeks in Treatment: 1 Verbal / Phone Orders: No Diagnosis Coding ICD-10 Coding Code Description E11.621 Type 2 diabetes mellitus with foot ulcer L97.522 Non-pressure chronic ulcer of other part of left foot with fat layer exposed L97.528 Non-pressure chronic ulcer of other part of left foot with other specified  severity I10 Essential (primary) hypertension Wound Cleansing Wound #1 Left Toe Fifth o Clean wound with Normal Saline. Wound #2 Left Toe Fourth o Clean wound with Normal Saline. Primary Wound Dressing Wound #1 Left Toe Fifth o Dry Gauze o Other: - Betadine Wound #2 Left Toe Fourth o Dry Gauze o Other: - Betadine Dressing Change Frequency Wound #1 Left Toe Fifth o Change dressing every day. Wound #2 Left Toe Fourth o Change dressing every day. Follow-up Appointments Wound #1 Left Toe Fifth o Return Appointment in 1 week. Wound #2 Left Toe Fourth o Return Appointment in 1 week. Electronic Signature(s) Signed: 06/25/2020 4:38:25 PM By: Lenda KelpStone III, Sherissa Tenenbaum PA-C Signed: 06/25/2020 4:54:21 PM By: Yevonne PaxEpps, Carrie RN Entered By: Yevonne PaxEpps, Carrie on 06/24/2020 10:44:58 William SergeantJONES, Case ([id number]) -------------------------------------------------------------------------------- Problem List Details Patient Name: William SergeantJONES, Rupert Date of Service: 06/24/2020 10:00 AM Medical Record Number: [id number] Patient Account Number: [id number] Date of Birth/Sex: [birth year] (68 y.o. M) Treating RN: Huel CoventryWoody, Kim Primary Care Provider: Herbert DeanerHARRIS, MEREDITH Other Clinician: Referring Provider: Herbert DeanerHARRIS, MEREDITH Treating Provider/Extender: Lenda KelpSTONE III, Janeen Watson Weeks in Treatment: 1 Active Problems ICD-10 Encounter Code Description Active Date MDM Diagnosis E11.621 Type 2 diabetes mellitus with foot ulcer 06/17/2020 No Yes L97.522 Non-pressure chronic ulcer of other part of left foot with fat layer 06/17/2020 No Yes exposed L97.528 Non-pressure chronic ulcer of other part of left foot with other specified 06/17/2020 No Yes severity I10 Essential (primary) hypertension 06/17/2020 No Yes Inactive Problems Resolved Problems Electronic Signature(s) Signed: 06/24/2020 10:37:45 AM By: Lenda KelpStone III, Yamel Bale PA-C Entered By: Lenda KelpStone III, Jaxtyn Linville on 06/24/2020 10:37:44 William SergeantJONES, Ichael  ([id number]) -------------------------------------------------------------------------------- Progress Note Details Patient Name: William SergeantJONES, Deklan Date of Service: 06/24/2020 10:00 AM Medical Record Number: [id number] Patient Account Number: [id number] Date of Birth/Sex: [birth year] (68 y.o. M) Treating RN:  Huel Coventry Primary Care Provider: Herbert Deaner Other Clinician: Referring Provider: Herbert Deaner Treating Provider/Extender: Linwood Dibbles, Thayer Inabinet Weeks in Treatment: 1 Subjective Chief Complaint Information obtained from Patient Left 4th and 5th toe ulcers History of Present Illness (HPI) 06/17/2020 upon evaluation today patient appears to be doing unfortunately not to well in regard to his left fourth and fifth toes. Unfortunately he does have some evidence of peripheral vascular disease he was noncompressible today. He is also diabetic which makes this even more concerning to be honest. In regard to his toe he actually appears to have what appears most likely a dry gangrene issue though he does have more draining centrally but has been soaking this a lot as well. I do believe that we will get a need to address this as soon as possible I did explain to the patient there is a high likelihood of him losing this toe even with appropriate wound care measures at this point. Obviously that is something that he really did not want to hear but nonetheless I did not want him to be misinformed about what I thought his chances were in this regard. The patient states he understands but he still hoping he does not lose the toe. In the meantime I did advise that he should discontinue using peroxide which she had already done, discontinue the use of Epson salt soaks which he tells me he would do, and I recommend that he not take a shower or bath where he is going to get this area wet he should have it covered if he is going to do either of those things obviously the bath is just out of the question  altogether as I do not want him soaking this. The wrist there is infection and already I think he is at high risk for this. He does have a history of hypertension but tells me that he has no other major medical problems currently. 06/24/2020 upon evaluation today patient appears to be doing okay at this time in regard to his left fourth and fifth toe ulcerations. At this point he is really not doing any worse also not really doing any better. He did have arterial studies performed with vein and vascular and subsequently this shows that he has poor arterial flow in the left especially and he is actually slated to have angioplasty and intervention by Dr. Gilda Crease. With that being said I do not see any signs at this point of active infection which is good news were still using Betadine at this time. He does not have an appointment date per Dr. Gilda Crease when he is can have the intervention as of yet. Objective Constitutional Well-nourished and well-hydrated in no acute distress. Vitals Time Taken: 10:15 AM, Height: 72 in, Weight: 202 lbs, BMI: 27.4, Temperature: 98.1 F, Pulse: 102 bpm, Respiratory Rate: 16 breaths/min, Blood Pressure: 142/86 mmHg. Respiratory normal breathing without difficulty. Psychiatric this patient is able to make decisions and demonstrates good insight into disease process. Alert and Oriented x 3. pleasant and cooperative. General Notes: Upon inspection patient's wound bed actually showed signs again of being stable on the outside between the fourth and fifth toe on the left foot there did appear to be some evidence of moisture breakdown but again it is difficult to tell the exact extent and with the patient's poor flow I am not going to perform any debridement whatsoever at this time. I did review the x-ray shows a chronic fracture of the great toe but nothing new or  there was some evidence of potential gout no signs of osteomyelitis noted on x-ray. Integumentary (Hair,  Skin) Wound #1 status is Open. Original cause of wound was Blister. The wound is located on the Left Toe Fifth. The wound measures 2.2cm length x 1.9cm width x 0.1cm depth; 3.283cm^2 area and 0.328cm^3 volume. There is Fat Layer (Subcutaneous Tissue) exposed. There is no tunneling or undermining noted. There is a medium amount of purulent drainage noted. The wound margin is indistinct and nonvisible. There is no granulation within the wound bed. There is a large (67-100%) amount of necrotic tissue within the wound bed including Eschar. Wound #2 status is Open. Original cause of wound was Blister. The wound is located on the Left Toe Fourth. The wound measures 0.5cm length x 0.5cm width x 0.1cm depth; 0.196cm^2 area and 0.02cm^3 volume. There is Fat Layer (Subcutaneous Tissue) exposed. There is no tunneling or undermining noted. There is a small amount of serous drainage noted. The wound margin is flat and intact. There is no granulation within the wound bed. There is a large (67-100%) amount of necrotic tissue within the wound bed including Eschar. William Melton ([id number]) Assessment Active Problems ICD-10 Type 2 diabetes mellitus with foot ulcer Non-pressure chronic ulcer of other part of left foot with fat layer exposed Non-pressure chronic ulcer of other part of left foot with other specified severity Essential (primary) hypertension Plan Wound Cleansing: Wound #1 Left Toe Fifth: Clean wound with Normal Saline. Wound #2 Left Toe Fourth: Clean wound with Normal Saline. Primary Wound Dressing: Wound #1 Left Toe Fifth: Dry Gauze Other: - Betadine Wound #2 Left Toe Fourth: Dry Gauze Other: - Betadine Dressing Change Frequency: Wound #1 Left Toe Fifth: Change dressing every day. Wound #2 Left Toe Fourth: Change dressing every day. Follow-up Appointments: Wound #1 Left Toe Fifth: Return Appointment in 1 week. Wound #2 Left Toe Fourth: Return Appointment in 1 week. 1. I would  recommend at this time that the patient continue to monitor for any signs of active infection. Obviously if anything changes such as increased redness, swelling, pain, or drainage he should contact the office let me know soon as possible. 2. I am also can recommend at this time that the patient needs to dress the wound daily using the Betadine. 3. I would also recommend that the patient contact vascular to see about getting the appointment scheduled for the angiogram as soon as possible obviously sooner that he can have some type of intervention to improve blood flow the better as far as this is concerned. We will see patient back for reevaluation in 1 week here in the clinic. If anything worsens or changes patient will contact our office for additional recommendations. Electronic Signature(s) Signed: 06/24/2020 2:40:39 PM By: Lenda Kelp PA-C Entered By: Lenda Kelp on 06/24/2020 14:40:38 William Melton ([id number]) -------------------------------------------------------------------------------- SuperBill Details Patient Name: William Melton Date of Service: 06/24/2020 Medical Record Number: [id number] Patient Account Number: [id number] Date of Birth/Sex: July 17, 1951 (68 y.o. M) Treating RN: Huel Coventry Primary Care Provider: Herbert Deaner Other Clinician: Referring Provider: Herbert Deaner Treating Provider/Extender: Linwood Dibbles, Timohty Renbarger Weeks in Treatment: 1 Diagnosis Coding ICD-10 Codes Code Description E11.621 Type 2 diabetes mellitus with foot ulcer L97.522 Non-pressure chronic ulcer of other part of left foot with fat layer exposed L97.528 Non-pressure chronic ulcer of other part of left foot with other specified severity I10 Essential (primary) hypertension Facility Procedures CPT4 Code: 08657846 Description: 99213 - WOUND CARE VISIT-LEV 3 EST PT Modifier: Quantity: 1  Physician Procedures CPT4 Code: [id number] Description: 99214 - WC PHYS LEVEL 4 - EST  PT Modifier: Quantity: 1 CPT4 Code: Description: ICD-10 Diagnosis Description E11.621 Type 2 diabetes mellitus with foot ulcer L97.522 Non-pressure chronic ulcer of other part of left foot with fat layer expo L97.528 Non-pressure chronic ulcer of other part of left foot with other specifie  I10 Essential (primary) hypertension Modifier: sed d severity Quantity: Electronic Signature(s) Signed: 06/24/2020 2:40:56 PM By: Lenda Kelp PA-C Entered By: Lenda Kelp on 06/24/2020 14:40:55

## 2020-06-25 NOTE — Progress Notes (Signed)
MORRILL, BOMKAMP ([id number]) Visit Report for 06/24/2020 Arrival Information Details Patient Name: William, Melton Date of Service: 06/24/2020 10:00 AM Medical Record Number: [id number] Patient Account Number: [id number] Date of Birth/Sex: 12-20-1950 (68 y.o. M) Treating RN: Huel Coventry Primary Care Abdirahim Flavell: Herbert Deaner Other Clinician: Referring Kobie Matkins: Herbert Deaner Treating Sadeel Fiddler/Extender: Linwood Dibbles, HOYT Weeks in Treatment: 1 Visit Information History Since Last Visit All ordered tests and consults were completed: No Patient Arrived: William Melton Added or deleted any medications: No Arrival Time: 10:13 Any new allergies or adverse reactions: No Accompanied By: self Had a fall or experienced change in No Transfer Assistance: None activities of daily living that may affect Patient Identification Verified: Yes risk of falls: Secondary Verification Process Completed: Yes Signs or symptoms of abuse/neglect since last visito No Patient Requires Transmission-Based Precautions: No Hospitalized since last visit: No Patient Has Alerts: No Implantable device outside of the clinic excluding No cellular tissue based products placed in the center since last visit: Has Dressing in Place as Prescribed: No Pain Present Now: Yes Electronic Signature(s) Signed: 06/25/2020 4:54:21 PM By: Yevonne Pax RN Entered By: Yevonne Pax on 06/24/2020 10:22:46 William Melton ([id number]) -------------------------------------------------------------------------------- Clinic Level of Care Assessment Details Patient Name: William Melton Date of Service: 06/24/2020 10:00 AM Medical Record Number: [id number] Patient Account Number: [id number] Date of Birth/Sex: [birth year] (68 y.o. M) Treating RN: Yevonne Pax Primary Care Robertlee Rogacki: Herbert Deaner Other Clinician: Referring Floyde Dingley: Herbert Deaner Treating Tavius Turgeon/Extender: Linwood Dibbles, HOYT Weeks in Treatment: 1 Clinic Level of Care Assessment  Items TOOL 4 Quantity Score []  - Use when only an EandM is performed on FOLLOW-UP visit 0 ASSESSMENTS - Nursing Assessment / Reassessment X - Reassessment of Co-morbidities (includes updates in patient status) 1 10 X- 1 5 Reassessment of Adherence to Treatment Plan ASSESSMENTS - Wound and Skin Assessment / Reassessment []  - Simple Wound Assessment / Reassessment - one wound 0 X- 2 5 Complex Wound Assessment / Reassessment - multiple wounds []  - 0 Dermatologic / Skin Assessment (not related to wound area) ASSESSMENTS - Focused Assessment []  - Circumferential Edema Measurements - multi extremities 0 []  - 0 Nutritional Assessment / Counseling / Intervention []  - 0 Lower Extremity Assessment (monofilament, tuning fork, pulses) []  - 0 Peripheral Arterial Disease Assessment (using hand held doppler) ASSESSMENTS - Ostomy and/or Continence Assessment and Care []  - Incontinence Assessment and Management 0 []  - 0 Ostomy Care Assessment and Management (repouching, etc.) PROCESS - Coordination of Care X - Simple Patient / Family Education for ongoing care 1 15 []  - 0 Complex (extensive) Patient / Family Education for ongoing care X- 1 10 Staff obtains , Records, Test Results / Process Orders []  - 0 Staff telephones HHA, Nursing Homes / Clarify orders / etc []  - 0 Routine Transfer to another Facility (non-emergent condition) []  - 0 Routine Hospital Admission (non-emergent condition) []  - 0 New Admissions / / Ordering NPWT, Apligraf, etc. []  - 0 Emergency Hospital Admission (emergent condition) X- 1 10 Simple Discharge Coordination []  - 0 Complex (extensive) Discharge Coordination PROCESS - Special Needs []  - Pediatric / Minor Patient Management 0 []  - 0 Isolation Patient Management []  - 0 Hearing / Language / Visual special needs []  - 0 Assessment of Community assistance (transportation, D/C planning, etc.) []  - 0 Additional assistance /  Altered mentation []  - 0 Support Surface(s) Assessment (bed, cushion, seat, etc.) INTERVENTIONS - Wound Cleansing / Measurement Lips, William Melton ( ) []  - 0 Simple Wound Cleansing - one wound X- 2 5  Complex Wound Cleansing - multiple wounds X- 1 5 Wound Imaging (photographs - any number of wounds)  - 0 Wound Tracing (instead of photographs)  - 0 Simple Wound Measurement - one wound X- 2 5 Complex Wound Measurement - multiple wounds INTERVENTIONS - Wound Dressings  - Small Wound Dressing one or multiple wounds 0 X- 1 15 Medium Wound Dressing one or multiple wounds  - 0 Large Wound Dressing one or multiple wounds  - 0 Application of Medications - topical  - 0 Application of Medications - injection INTERVENTIONS - Miscellaneous  - External ear exam 0  - 0 Specimen Collection (cultures, biopsies, blood, body fluids, etc.)  - 0 Specimen(s) / Culture(s) sent or taken to Lab for analysis  - 0 Patient Transfer (multiple staff / Nurse, adult / Similar devices)  - 0 Simple Staple / Suture removal (25 or less)  - 0 Complex Staple / Suture removal (26 or more)  - 0 Hypo / Hyperglycemic Management (close monitor of Blood Glucose)  - 0 Ankle / Brachial Index (ABI) - do not check if billed separately X- 1 5 Vital Signs Has the patient been seen at the hospital within the last three years: Yes Total Score: 105 Level Of Care: New/Established - Level 3 Electronic Signature(s) Signed: 06/25/2020 4:54:21 PM By: Yevonne Pax RN Entered By: Yevonne Pax on 06/24/2020 10:48:54 William Melton ([id number]) -------------------------------------------------------------------------------- Encounter Discharge Information Details Patient Name: William Melton Date of Service: 06/24/2020 10:00 AM Medical Record Number: [id number] Patient Account Number: [id number] Date of Birth/Sex: [birth year] (68 y.o. M) Treating RN: Yevonne Pax Primary Care Bright Spielmann: Herbert Deaner Other Clinician: Referring Kailei Cowens: Herbert Deaner Treating Hernandez Losasso/Extender: Linwood Dibbles, HOYT Weeks in Treatment: 1 Encounter Discharge Information Items Discharge Condition: Stable Ambulatory Status: Ambulatory Discharge Destination: Home Transportation: Private Auto Accompanied By: self Schedule Follow-up Appointment: Yes Clinical Summary of Care: Electronic Signature(s) Signed: 06/25/2020 4:54:21 PM By: Yevonne Pax RN Entered By: Yevonne Pax on 06/24/2020 10:49:49 William Melton ([id number]) -------------------------------------------------------------------------------- Lower Extremity Assessment Details Patient Name: William Melton Date of Service: 06/24/2020 10:00 AM Medical Record Number: [id number] Patient Account Number: [id number] Date of Birth/Sex: 1950/11/27 (68 y.o. M) Treating RN: Yevonne Pax Primary Care Aren Pryde: Herbert Deaner Other Clinician: Referring Treshawn Allen: Herbert Deaner Treating Minyon Billiter/Extender: STONE III, HOYT Weeks in Treatment: 1 Edema Assessment Assessed: [Left: No] [Right: No] [Left: Edema] [Right: :] Calf Left: Right: Point of Measurement: From Medial Instep 34 cm Ankle Left: Right: Point of Measurement: From Medial Instep 22 cm Electronic Signature(s) Signed: 06/25/2020 4:54:21 PM By: Yevonne Pax RN Entered By: Yevonne Pax on 06/24/2020 10:32:46 William Melton ([id number]) -------------------------------------------------------------------------------- Multi Wound Chart Details Patient Name: William Melton Date of Service: 06/24/2020 10:00 AM Medical Record Number: [id number] Patient Account Number: [id number] Date of Birth/Sex: 10-Nov-1950 (68 y.o. 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/Ulcer of the Lower N/A Extremity Extremity Comorbid History: Hypertension, Type I Diabetes, Hypertension, Type I Diabetes, N/A Gout Gout Date Acquired: 05/03/2020 05/03/2020 N/A Weeks of Treatment: 1 1 N/A Wound Status: Open Open N/A Pending Amputation on Yes No N/A Presentation: Measurements L x W x D (cm) 2.2x1.9x0.1 0.5x0.5x0.1 N/A Area (cm) :  3.283 0.196 N/A Volume (cm) : 0.328 0.02 N/A % Reduction in Area: 50.80% -317.00% N/A % Reduction in Volume: 50.90% -300.00% N/A Classification: Grade 4 Grade 2 N/A Exudate Amount: Medium Small N/A Exudate Type: Purulent Serous N/A Exudate Color: yellow, brown, green amber N/A Wound Margin: Indistinct, nonvisible Flat and Intact N/A Granulation Amount: None Present (0%) None Present (0%) N/A Necrotic Amount: Large (67-100%) Large (67-100%) N/A Necrotic Tissue: Eschar Eschar N/A Exposed Structures: Fat Layer (Subcutaneous Tissue): Fat Layer (Subcutaneous Tissue): N/A Yes Yes Fascia: No Fascia: No Tendon: No Tendon: No Muscle: No Muscle: No Joint: No Joint: No Bone: No Bone: No Epithelialization: None None N/A Treatment Notes Electronic Signature(s) Signed: 06/25/2020 4:54:21 PM By: Yevonne Pax RN Entered By: Yevonne Pax on 06/24/2020 10:42:33 William Melton ([id number]) William Melton ([id number]) -------------------------------------------------------------------------------- Multi-Disciplinary Care Plan Details Patient Name: William Melton Date of Service: 06/24/2020 10:00 AM Medical Record Number: [id number] Patient Account Number: [id number] Date of Birth/Sex: [birth year] (69 y.o. M) Treating RN: Yevonne Pax Primary Care Ellagrace Yoshida: Herbert Deaner Other  Clinician: Referring Haynes Giannotti: Herbert Deaner Treating Cole Eastridge/Extender: Linwood Dibbles, HOYT Weeks in Treatment: 1 Active Inactive Necrotic Tissue Nursing Diagnoses: Impaired tissue integrity related to necrotic/devitalized tissue Goals: Patient/caregiver will verbalize understanding of reason and process for debridement of necrotic tissue Date Initiated: 06/17/2020 Target Resolution Date: 07/11/2020 Goal Status: Active Interventions: Assess patient pain level pre-, during and post procedure and prior to discharge Notes: Orientation to the Wound Care Program Nursing Diagnoses: Knowledge deficit related to the wound healing center program Goals: Patient/caregiver will verbalize understanding of the Wound Healing Center Program Date Initiated: 06/17/2020 Target Resolution Date: 07/11/2020 Goal Status: Active Interventions: Provide education on orientation to the wound center Notes: Wound/Skin Impairment Nursing Diagnoses: Impaired tissue integrity Goals: Ulcer/skin breakdown will have a volume reduction of 30% by week 4 Date Initiated: 06/17/2020 Target Resolution Date: 07/12/2020 Goal Status: Active Interventions: Assess ulceration(s) every visit Notes: Electronic Signature(s) Signed: 06/25/2020 4:54:21 PM By: Yevonne Pax RN Entered By: Yevonne Pax on 06/24/2020 10:42:17 William Melton ([id number]) -------------------------------------------------------------------------------- Pain Assessment Details Patient Name: William Melton Date of Service: 06/24/2020 10:00 AM Medical Record Number: [id number] Patient Account Number: [id number] Date of Birth/Sex: June 10, 1951 (68 y.o. M) Treating RN: Yevonne Pax Primary Care Johnisha Louks: Herbert Deaner Other Clinician: Referring [place]: Herbert Deaner Treating Haeleigh Streiff/Extender: Linwood Dibbles, HOYT Weeks in Treatment: 1 Active Problems Location of Pain Severity and Description of Pain Patient Has Paino Yes Site Locations With  Dressing Change: Yes Duration of the Pain. Constant / Intermittento Intermittent How Long Does it Lasto Hours: Minutes: 15 Rate the pain. Current Pain Level: 6 Worst Pain Level: 9 Least Pain Level: 0 Tolerable Pain Level: 5 Character of Pain Describe the Pain: Throbbing Pain Management and Medication Current Pain Management: Medication: No Cold Application: No Rest: Yes Massage: No Activity: No T.E.N.S.: No Heat Application: No Leg drop or elevation: No Is the Current Pain Management Adequate: Inadequate How does your wound impact your activities of daily livingo Sleep: No Bathing: No Appetite: No Relationship With Others: No Bladder Continence: No Emotions: No Bowel Continence: No Work: No Toileting: No Drive: No Dressing: No Hobbies: No Electronic Signature(s) Signed: 06/25/2020 4:54:21 PM By: Yevonne Pax RN Entered By: Yevonne Pax on 06/24/2020 10:24:03 William Melton ([id number]) -------------------------------------------------------------------------------- Patient/Caregiver Education Details Patient Name: William Melton Date of Service: 06/24/2020 10:00 AM Medical Record Number: [id number] Patient Account Number: [id number] Date of Birth/Gender: 1951/05/03 (68 y.o. M) Treating RN: Yevonne Pax Primary Care Physician: Herbert Deaner Other Clinician: Referring Physician: Tiburcio Pea,  MEREDITH Treating Physician/Extender: Linwood Dibbles, HOYT Weeks in Treatment: 1 Education Assessment Education Provided To: Patient Education Topics Provided Wound/Skin Impairment: Handouts: Caring for Your Ulcer Methods: Demonstration, Explain/Verbal Responses: State content correctly Electronic Signature(s) Signed: 06/25/2020 4:54:21 PM By: Yevonne Pax RN Entered By: Yevonne Pax on 06/24/2020 10:49:13 William Melton ([id number]) -------------------------------------------------------------------------------- Wound Assessment Details Patient Name: William Melton Date of Service:  06/24/2020 10:00 AM Medical Record Number: [id number] Patient Account Number: [id number] Date of Birth/Sex: 1951/07/16 (68 y.o. M) Treating RN: Yevonne Pax Primary Care Darlynn Ricco: Herbert Deaner Other Clinician: Referring Philisha Weinel: Herbert Deaner Treating Tija Biss/Extender: Linwood Dibbles, HOYT Weeks in Treatment: 1 Wound Status Wound Number: 1 Primary Etiology: Diabetic Wound/Ulcer of the Lower Extremity Wound Location: Left Toe Fifth Wound Status: Open Wounding Event: Blister Comorbid History: Hypertension, Type I Diabetes, Gout Date Acquired: 05/03/2020 Weeks Of Treatment: 1 Clustered Wound: No Pending Amputation On Presentation Photos Wound Measurements Length: (cm) 2.2 % Width: (cm) 1.9 % Depth: (cm) 0.1 Ep Area: (cm) 3.283 T Volume: (cm) 0.328 U Reduction in Area: 50.8% Reduction in Volume: 50.9% ithelialization: None unneling: No ndermining: No Wound Description Classification: Grade 4 F Wound Margin: Indistinct, nonvisible S Exudate Amount: Medium Exudate Type: Purulent Exudate Color: yellow, brown, green oul Odor After Cleansing: No lough/Fibrino Yes Wound Bed Granulation Amount: None Present (0%) Exposed Structure Necrotic Amount: Large (67-100%) Fascia Exposed: No Necrotic Quality: Eschar Fat Layer (Subcutaneous Tissue) Exposed: Yes Tendon Exposed: No Muscle Exposed: No Joint Exposed: No Bone Exposed: No Treatment Notes Wound #1 (Left Toe Fifth) Notes betadine, gauze ASAHD, CAN ([id number]) Electronic Signature(s) Signed: 06/25/2020 4:54:21 PM By: Yevonne Pax RN Entered By: Yevonne Pax on 06/24/2020 10:31:07 William Melton ([id number]) -------------------------------------------------------------------------------- Wound Assessment Details Patient Name: William Melton Date of Service: 06/24/2020 10:00 AM Medical Record Number: [id number] Patient Account Number: [id number] Date of Birth/Sex: 01/18/1951 (68 y.o. M) Treating RN: Yevonne Pax Primary Care Deiontae Rabel: Herbert Deaner Other Clinician: Referring Codee Tutson: Herbert Deaner Treating Zya Finkle/Extender: Linwood Dibbles, HOYT Weeks in Treatment: 1 Wound Status Wound Number: 2 Primary Etiology: Diabetic Wound/Ulcer of the Lower Extremity Wound Location: Left Toe Fourth Wound Status: Open Wounding Event: Blister Comorbid History: Hypertension, Type I Diabetes, Gout Date Acquired: 05/03/2020 Weeks Of Treatment: 1 Clustered Wound: No Photos Wound Measurements Length: (cm) 0.5 % Reduc Width: (cm) 0.5 % Reduc Depth: (cm) 0.1 Epithel Area: (cm) 0.196 Tunnel Volume: (cm) 0.02 Underm tion in Area: -317% tion in Volume: -300% ialization: None ing: No ining: No Wound Description Classification: Grade 2 Foul O Wound Margin: Flat and Intact Slough Exudate Amount: Small Exudate Type: Serous Exudate Color: amber dor After Cleansing: No /Fibrino Yes Wound Bed Granulation Amount: None Present (0%) Exposed Structure Necrotic Amount: Large (67-100%) Fascia Exposed: No Necrotic Quality: Eschar Fat Layer (Subcutaneous Tissue) Exposed: Yes Tendon Exposed: No Muscle Exposed: No Joint Exposed: No Bone Exposed: No Treatment Notes Wound #2 (Left Toe Fourth) Notes betadine, gauze Electronic Signature(s) Signed: 06/25/2020 4:54:21 PM By: Yevonne Pax RN Yetta Barre, Tobin ([id number]) Entered By: Yevonne Pax on 06/24/2020 10:31:40 William Melton ([id number]) -------------------------------------------------------------------------------- Vitals Details Patient Name: William Melton Date of Service: 06/24/2020 10:00 AM Medical Record Number: [id number] Patient Account Number: [id number] Date of Birth/Sex: [birth year] (68 y.o. M) Treating RN: Huel Coventry Primary Care Britlyn Martine: Herbert Deaner Other Clinician: Referring Fahmida Jurich: Herbert Deaner Treating Jaben Benegas/Extender: Linwood Dibbles, HOYT Weeks in Treatment: 1 Vital Signs Time Taken: 10:15 Temperature (F): 98.1 Height  (in): 72 Pulse (bpm): 102 Weight (lbs): 202 Respiratory Rate (breaths/min): 16 Body Mass Index (BMI): 27.4 Blood  Pressure (mmHg): 142/86 Reference Range: 80 - 120 mg / dl Electronic Signature(s) Signed: 06/24/2020 3:32:07 PM By: Dayton MartesWallace, RCP,RRT,CHT, Sallie RCP, RRT, CHT Entered By: Weyman RodneyWallace, RCP,RRT,CHT, Lucio EdwardSallie on 06/24/2020 10:19:21

## 2020-06-27 ENCOUNTER — Other Ambulatory Visit: Payer: Self-pay

## 2020-06-27 ENCOUNTER — Other Ambulatory Visit
Admission: RE | Admit: 2020-06-27 | Discharge: 2020-06-27 | Disposition: A | Discharge status: Home / Self Care | Payer: Medicare HMO | Source: Ambulatory Visit | Attending: Vascular Surgery | Admitting: Vascular Surgery

## 2020-06-27 DIAGNOSIS — Z01812 Encounter for preprocedural laboratory examination: Secondary | ICD-10-CM | POA: Diagnosis present

## 2020-06-27 DIAGNOSIS — Z20822 Contact with and (suspected) exposure to covid-19: Secondary | ICD-10-CM | POA: Insufficient documentation

## 2020-06-28 LAB — SARS CORONAVIRUS 2 (TAT 6-24 HRS): SARS Coronavirus 2: NEGATIVE

## 2020-06-30 ENCOUNTER — Other Ambulatory Visit (INDEPENDENT_AMBULATORY_CARE_PROVIDER_SITE_OTHER): Payer: Self-pay | Admitting: Nurse Practitioner

## 2020-07-01 ENCOUNTER — Ambulatory Visit
Admission: RE | Admit: 2020-07-01 | Discharge: 2020-07-01 | Disposition: A | Discharge status: Home / Self Care | Payer: Medicare HMO | Attending: Vascular Surgery | Admitting: Vascular Surgery

## 2020-07-01 DIAGNOSIS — I70269 Atherosclerosis of native arteries of extremities with gangrene, unspecified extremity: Secondary | ICD-10-CM

## 2020-07-08 ENCOUNTER — Ambulatory Visit: Payer: Medicare HMO | Admitting: Physician Assistant

## 2020-07-18 ENCOUNTER — Other Ambulatory Visit: Admit: 2020-07-18 | Payer: Medicare HMO

## 2020-07-18 ENCOUNTER — Ambulatory Visit: Payer: Medicare HMO | Admitting: Physician Assistant

## 2020-07-22 ENCOUNTER — Ambulatory Visit: Admission: RE | Admit: 2020-07-22 | Payer: Medicare HMO | Source: Home / Self Care | Admitting: Vascular Surgery

## 2020-07-22 ENCOUNTER — Encounter: Admission: RE | Payer: Self-pay | Source: Home / Self Care

## 2020-07-22 ENCOUNTER — Other Ambulatory Visit (INDEPENDENT_AMBULATORY_CARE_PROVIDER_SITE_OTHER): Payer: Self-pay | Admitting: Nurse Practitioner

## 2020-07-22 DIAGNOSIS — I70269 Atherosclerosis of native arteries of extremities with gangrene, unspecified extremity: Secondary | ICD-10-CM

## 2020-07-22 SURGERY — LOWER EXTREMITY ANGIOGRAPHY
Anesthesia: Moderate Sedation | Site: Leg Lower | Laterality: Left

## 2020-07-24 ENCOUNTER — Telehealth (INDEPENDENT_AMBULATORY_CARE_PROVIDER_SITE_OTHER): Payer: Self-pay

## 2020-07-24 NOTE — Telephone Encounter (Signed)
Patient's son returned my call and the patient has been rescheduled with Dr. Gilda Crease for a left leg angio on 08/05/20 with a 6:45 am arrival time to the [facility]. Covid testing on 07/30/20 between 8-1 pm at the [facility]. Pre-procedure instructions were discussed and will be mailed.

## 2020-08-01 ENCOUNTER — Other Ambulatory Visit
Admission: RE | Admit: 2020-08-01 | Discharge: 2020-08-01 | Disposition: A | Discharge status: Home / Self Care | Payer: Medicare HMO | Source: Ambulatory Visit | Attending: Vascular Surgery | Admitting: Vascular Surgery

## 2020-08-01 ENCOUNTER — Other Ambulatory Visit: Payer: Self-pay

## 2020-08-01 DIAGNOSIS — Z01812 Encounter for preprocedural laboratory examination: Secondary | ICD-10-CM | POA: Insufficient documentation

## 2020-08-01 DIAGNOSIS — Z20822 Contact with and (suspected) exposure to covid-19: Secondary | ICD-10-CM | POA: Diagnosis not present

## 2020-08-02 LAB — SARS CORONAVIRUS 2 (TAT 6-24 HRS): SARS Coronavirus 2: NEGATIVE

## 2020-08-05 ENCOUNTER — Encounter
Admission: RE | Disposition: A | Discharge status: Home / Self Care | Payer: Self-pay | Source: Home / Self Care | Attending: Vascular Surgery

## 2020-08-05 ENCOUNTER — Ambulatory Visit
Admission: RE | Admit: 2020-08-05 | Discharge: 2020-08-05 | Disposition: A | Discharge status: Home / Self Care | Payer: Medicare HMO | Attending: Vascular Surgery | Admitting: Vascular Surgery

## 2020-08-05 ENCOUNTER — Other Ambulatory Visit: Payer: Self-pay

## 2020-08-05 ENCOUNTER — Other Ambulatory Visit (INDEPENDENT_AMBULATORY_CARE_PROVIDER_SITE_OTHER): Payer: Self-pay | Admitting: Nurse Practitioner

## 2020-08-05 ENCOUNTER — Encounter: Payer: Self-pay | Admitting: Vascular Surgery

## 2020-08-05 DIAGNOSIS — E1152 Type 2 diabetes mellitus with diabetic peripheral angiopathy with gangrene: Secondary | ICD-10-CM | POA: Diagnosis present

## 2020-08-05 DIAGNOSIS — I70245 Atherosclerosis of native arteries of left leg with ulceration of other part of foot: Secondary | ICD-10-CM | POA: Diagnosis not present

## 2020-08-05 DIAGNOSIS — I70262 Atherosclerosis of native arteries of extremities with gangrene, left leg: Secondary | ICD-10-CM | POA: Insufficient documentation

## 2020-08-05 DIAGNOSIS — F17210 Nicotine dependence, cigarettes, uncomplicated: Secondary | ICD-10-CM | POA: Insufficient documentation

## 2020-08-05 DIAGNOSIS — I70269 Atherosclerosis of native arteries of extremities with gangrene, unspecified extremity: Secondary | ICD-10-CM | POA: Diagnosis not present

## 2020-08-05 DIAGNOSIS — Z794 Long term (current) use of insulin: Secondary | ICD-10-CM | POA: Insufficient documentation

## 2020-08-05 DIAGNOSIS — Z79899 Other long term (current) drug therapy: Secondary | ICD-10-CM | POA: Insufficient documentation

## 2020-08-05 DIAGNOSIS — L97529 Non-pressure chronic ulcer of other part of left foot with unspecified severity: Secondary | ICD-10-CM | POA: Insufficient documentation

## 2020-08-05 DIAGNOSIS — E11621 Type 2 diabetes mellitus with foot ulcer: Secondary | ICD-10-CM | POA: Insufficient documentation

## 2020-08-05 DIAGNOSIS — I10 Essential (primary) hypertension: Secondary | ICD-10-CM | POA: Diagnosis not present

## 2020-08-05 HISTORY — PX: LOWER EXTREMITY ANGIOGRAPHY: CATH118251

## 2020-08-05 LAB — BASIC METABOLIC PANEL
Anion gap: 14 (ref 5–15)
BUN: 36 mg/dL — ABNORMAL HIGH (ref 8–23)
CO2: 27 mmol/L (ref 22–32)
Calcium: 9.1 mg/dL (ref 8.9–10.3)
Chloride: 98 mmol/L (ref 98–111)
Creatinine, Ser: 1.93 mg/dL — ABNORMAL HIGH (ref 0.61–1.24)
GFR, Estimated: 37 mL/min — ABNORMAL LOW (ref >60)
Glucose, Bld: 153 mg/dL — ABNORMAL HIGH (ref 70–99)
Potassium: 3.1 mmol/L — ABNORMAL LOW (ref 3.5–5.1)
Sodium: 139 mmol/L (ref 135–145)

## 2020-08-05 LAB — GLUCOSE, CAPILLARY: Glucose-Capillary: 162 mg/dL — ABNORMAL HIGH (ref 70–99)

## 2020-08-05 SURGERY — LOWER EXTREMITY ANGIOGRAPHY
Anesthesia: Moderate Sedation | Laterality: Left

## 2020-08-05 MED ORDER — HEPARIN SODIUM (PORCINE) 1000 UNIT/ML IJ SOLN
INTRAMUSCULAR | Status: DC | PRN
  Administered 2020-08-05: 5000 [IU] via INTRAVENOUS

## 2020-08-05 MED ORDER — LABETALOL HCL 5 MG/ML IV SOLN
INTRAVENOUS | Status: DC | PRN
  Administered 2020-08-05: 10 mg via INTRAVENOUS

## 2020-08-05 MED ORDER — SODIUM CHLORIDE 0.9% FLUSH: 3.0000 mL | Freq: Two times a day (BID) | INTRAVENOUS | Status: DC

## 2020-08-05 MED ORDER — MIDAZOLAM HCL 2 MG/2ML IJ SOLN
INTRAMUSCULAR | Status: DC | PRN
  Administered 2020-08-05: 2 mg via INTRAVENOUS
  Administered 2020-08-05: 1 mg via INTRAVENOUS

## 2020-08-05 MED ORDER — MIDAZOLAM HCL 5 MG/5ML IJ SOLN
INTRAMUSCULAR | Status: AC
  Filled 2020-08-05: qty 5

## 2020-08-05 MED ORDER — POTASSIUM CHLORIDE CRYS ER 20 MEQ PO TBCR
40.0000 meq | EXTENDED_RELEASE_TABLET | Freq: Once | ORAL | Status: AC
  Administered 2020-08-05: 40 meq via ORAL

## 2020-08-05 MED ORDER — DIPHENHYDRAMINE HCL 50 MG/ML IJ SOLN: 50.0000 mg | Freq: Once | INTRAMUSCULAR | Status: DC | PRN

## 2020-08-05 MED ORDER — LABETALOL HCL 5 MG/ML IV SOLN: 10.0000 mg | INTRAVENOUS | Status: DC | PRN

## 2020-08-05 MED ORDER — CEFAZOLIN SODIUM-DEXTROSE 2-4 GM/100ML-% IV SOLN: 2.0000 g | Freq: Once | INTRAVENOUS | Status: AC

## 2020-08-05 MED ORDER — POTASSIUM CHLORIDE CRYS ER 20 MEQ PO TBCR
EXTENDED_RELEASE_TABLET | ORAL | Status: AC
  Filled 2020-08-05: qty 2

## 2020-08-05 MED ORDER — ASPIRIN EC 325 MG PO TBEC: 325.0000 mg | DELAYED_RELEASE_TABLET | ORAL | Status: DC

## 2020-08-05 MED ORDER — HYDRALAZINE HCL 20 MG/ML IJ SOLN
INTRAMUSCULAR | Status: AC
  Filled 2020-08-05: qty 1

## 2020-08-05 MED ORDER — HYDRALAZINE HCL 20 MG/ML IJ SOLN
5.0000 mg | INTRAMUSCULAR | Status: AC | PRN
  Administered 2020-08-05 (×2): 5 mg via INTRAVENOUS

## 2020-08-05 MED ORDER — HEPARIN SODIUM (PORCINE) 1000 UNIT/ML IJ SOLN
INTRAMUSCULAR | Status: AC
  Filled 2020-08-05: qty 1

## 2020-08-05 MED ORDER — ASPIRIN EC 81 MG PO TBEC: 81.0000 mg | DELAYED_RELEASE_TABLET | Freq: Every day | ORAL | 2 refills | Status: AC

## 2020-08-05 MED ORDER — CLOPIDOGREL BISULFATE 300 MG PO TABS: 300.0000 mg | ORAL_TABLET | ORAL | Status: DC

## 2020-08-05 MED ORDER — ONDANSETRON HCL 4 MG/2ML IJ SOLN: 4.0000 mg | Freq: Four times a day (QID) | INTRAMUSCULAR | Status: DC | PRN

## 2020-08-05 MED ORDER — CEFAZOLIN SODIUM-DEXTROSE 2-4 GM/100ML-% IV SOLN
INTRAVENOUS | Status: AC
  Administered 2020-08-05: 2 g via INTRAVENOUS
  Filled 2020-08-05: qty 100

## 2020-08-05 MED ORDER — SODIUM CHLORIDE 0.9 % IV SOLN: 250.0000 mL | INTRAVENOUS | Status: DC | PRN

## 2020-08-05 MED ORDER — HYDROMORPHONE HCL 1 MG/ML IJ SOLN: 1.0000 mg | Freq: Once | INTRAMUSCULAR | Status: DC | PRN

## 2020-08-05 MED ORDER — METHYLPREDNISOLONE SODIUM SUCC 125 MG IJ SOLR: 125.0000 mg | Freq: Once | INTRAMUSCULAR | Status: DC | PRN

## 2020-08-05 MED ORDER — SODIUM CHLORIDE 0.9 % IV SOLN: INTRAVENOUS | Status: DC

## 2020-08-05 MED ORDER — MIDAZOLAM HCL 2 MG/ML PO SYRP: 8.0000 mg | ORAL_SOLUTION | Freq: Once | ORAL | Status: DC | PRN

## 2020-08-05 MED ORDER — OXYCODONE HCL 5 MG PO TABS: 5.0000 mg | ORAL_TABLET | ORAL | Status: DC | PRN

## 2020-08-05 MED ORDER — FAMOTIDINE 20 MG PO TABS: 40.0000 mg | ORAL_TABLET | Freq: Once | ORAL | Status: DC | PRN

## 2020-08-05 MED ORDER — MORPHINE SULFATE (PF) 4 MG/ML IV SOLN: 2.0000 mg | INTRAVENOUS | Status: DC | PRN

## 2020-08-05 MED ORDER — FENTANYL CITRATE (PF) 100 MCG/2ML IJ SOLN
INTRAMUSCULAR | Status: DC | PRN
  Administered 2020-08-05 (×2): 50 ug via INTRAVENOUS

## 2020-08-05 MED ORDER — ACETAMINOPHEN 325 MG PO TABS: 650.0000 mg | ORAL_TABLET | ORAL | Status: DC | PRN

## 2020-08-05 MED ORDER — SODIUM CHLORIDE 0.9% FLUSH: 3.0000 mL | INTRAVENOUS | Status: DC | PRN

## 2020-08-05 MED ORDER — FENTANYL CITRATE (PF) 100 MCG/2ML IJ SOLN
INTRAMUSCULAR | Status: AC
  Filled 2020-08-05: qty 2

## 2020-08-05 MED ORDER — CLOPIDOGREL BISULFATE 75 MG PO TABS: 75.0000 mg | ORAL_TABLET | Freq: Every day | ORAL | 4 refills | Status: DC

## 2020-08-05 MED ORDER — LABETALOL HCL 5 MG/ML IV SOLN
INTRAVENOUS | Status: AC
  Filled 2020-08-05: qty 4

## 2020-08-05 SURGICAL SUPPLY — 21 items
BALLN ULTRASCORE 014 3X40X150 (BALLOONS) ×3
BALLN ULTRSCOR 014 2.5X100X150 (BALLOONS) ×3
BALLN ULTRVRSE 2X300X150 (BALLOONS) ×2
BALLN ULTRVRSE 2X300X150 OTW (BALLOONS) ×1
BALLOON ULTRSC 014 2.5X100X150 (BALLOONS) ×1 IMPLANT
BALLOON ULTRSCRE 014 3X40X150 (BALLOONS) ×1 IMPLANT
BALLOON ULTRVRSE 2X300X150 OTW (BALLOONS) ×1 IMPLANT
CATH ANGIO 5F PIGTAIL 65CM (CATHETERS) ×3 IMPLANT
CATH SEEKER .018X150 (CATHETERS) ×3 IMPLANT
CATH VERT 5FR 125CM (CATHETERS) ×3 IMPLANT
DEVICE STARCLOSE SE CLOSURE (Vascular Products) ×3 IMPLANT
GLIDEWIRE ADV .035X260CM (WIRE) ×3 IMPLANT
KIT ENCORE 26 ADVANTAGE (KITS) ×3 IMPLANT
NEEDLE ENTRY 21GA 7CM ECHOTIP (NEEDLE) ×3 IMPLANT
PACK ANGIOGRAPHY (CUSTOM PROCEDURE TRAY) ×3 IMPLANT
SET INTRO CAPELLA COAXIAL (SET/KITS/TRAYS/PACK) ×3 IMPLANT
SHEATH BRITE TIP 5FRX11 (SHEATH) ×3 IMPLANT
SHEATH GUIDING CAROTID 6FRX90 (SHEATH) ×3 IMPLANT
WIRE G V18X300CM (WIRE) ×3 IMPLANT
WIRE GUIDERIGHT .035X150 (WIRE) ×3 IMPLANT
WIRE RUNTHROUGH .014X300CM (WIRE) ×3 IMPLANT

## 2020-08-05 NOTE — Interval H&P Note (Signed)
History and Physical Interval Note:  08/05/2020 7:59 AM  William Melton  has presented today for surgery, with the diagnosis of LLE Angiography   ASO with gangrene   BARD Rep  cc: [name] Pt has already had Covid test.  The various methods of treatment have been discussed with the patient and family. After consideration of risks, benefits and other options for treatment, the patient has consented to  Procedure(s): LOWER EXTREMITY ANGIOGRAPHY (Left) as a surgical intervention.  The patient's history has been reviewed, patient examined, no change in status, stable for surgery.  I have reviewed the patient's chart and labs.  Questions were answered to the patient's satisfaction.     [name]

## 2020-08-05 NOTE — H&P (Signed)
@LOGO @   MRN :  William Melton is a 69 y.o. (08/14/1951) male who presents with chief complaint of No chief complaint on file. 08/29/1951  History of Present Illness:   The patient presents for treatment of painful lower extremities and diminished pulses associated with ulceration of the foot.  The patient notes the ulcer has been present for multiple weeks and has not been improving.  It is very painful and has had some drainage.  No specific history of trauma noted by the patient.  The patient denies fever or chills.  the patient does have diabetes which has been difficult to control.  Patient notes prior to the ulcer developing the extremities were painful particularly with ambulation or activity and the discomfort is very consistent day today. Typically, the pain occurs at less than one block, progress is as activity continues to the point that the patient must stop walking. Resting including standing still for several minutes allowed resumption of the activity and the ability to walk a similar distance before stopping again. Uneven terrain and inclined shorten the distance. The pain has been progressive over the past several years.   The patient denies rest pain or dangling of an extremity off the side of the bed during the night for relief. No prior interventions or surgeries.  No history of back problems or DJD of the lumbar sacral spine.   The patient denies amaurosis fugax or recent TIA symptoms. There are no recent neurological changes noted. The patient denies history of DVT, PE or superficial thrombophlebitis.  Current Meds  Medication Sig  . allopurinol (ZYLOPRIM) 100 MG tablet Take 100 mg by mouth daily.   Marland Kitchen amLODipine (NORVASC) 10 MG tablet Take 10 mg by mouth daily.   . indomethacin (INDOCIN) 50 MG capsule Take 50 mg by mouth daily.  . insulin glargine (LANTUS) 100 UNIT/ML injection Inject 50 Units into the skin daily.   Marland Kitchen lovastatin (MEVACOR) 20 MG tablet Take 20 mg  by mouth at bedtime.    Past Medical History:  Diagnosis Date  . Arthritis   . Diabetes mellitus without complication (HCC)   . Hyperlipidemia   . Hypertension     History reviewed. No pertinent surgical history.  Social History Social History   Tobacco Use  . Smoking status: Current Every Day Smoker    Packs/day: 1.00    Types: Cigarettes  Vaping Use  . Vaping Use: Never used  Substance Use Topics  . Alcohol use: Not on file  . Drug use: Not on file    Family History Family History  Problem Relation Age of Onset  . Heart disease Mother   . COPD Father   . Hyperlipidemia Father     No Known Allergies   REVIEW OF SYSTEMS (Negative unless checked)  Constitutional: [] Weight loss  [] Fever  [] Chills Cardiac: [] Chest pain   [] Chest pressure   [] Palpitations   [] Shortness of breath when laying flat   [] Shortness of breath with exertion. Vascular:  [] Pain in legs with walking   [x] Pain in legs at rest  [] History of DVT   [] Phlebitis   [] Swelling in legs   [] Varicose veins   [x] Non-healing ulcers Pulmonary:   [] Uses home oxygen   [] Productive cough   [] Hemoptysis   [] Wheeze  [] COPD   [] Asthma Neurologic:  [] Dizziness   [] Seizures   [] History of stroke   [] History of TIA  [] Aphasia   [] Vissual changes   [] Weakness or numbness in arm   [] Weakness or  numbness in leg Musculoskeletal:   [] Joint swelling   [] Joint pain   [] Low back pain Hematologic:  [] Easy bruising  [] Easy bleeding   [] Hypercoagulable state   [] Anemic Gastrointestinal:  [] Diarrhea   [] Vomiting  [] Gastroesophageal reflux/heartburn   [] Difficulty swallowing. Genitourinary:  [] Chronic kidney disease   [] Difficult urination  [] Frequent urination   [] Blood in urine Skin:  [] Rashes   [] Ulcers  Psychological:  [] History of anxiety   []  History of major depression.  Physical Examination  Vitals:   08/05/20 0721  BP: (!) 166/102  Pulse: 74  Resp: 20  Temp: 97.7 F (36.5 C)  TempSrc: Oral  SpO2: 98%  Weight:  86.2 kg  Height: 6' (1.829 m)   Body mass index is 25.77 kg/m. Gen: WD/WN, NAD Head: [place]/AT, No temporalis wasting.  Ear/Nose/Throat: Hearing grossly intact, nares w/o erythema or drainage Eyes: PER, EOMI, sclera nonicteric.  Neck: Supple, no large masses.   Pulmonary:  Good air movement, no audible wheezing bilaterally, no use of accessory muscles.  Cardiac: RRR, no JVD Vascular: Gangrene of the 4th and 5th toes left foot Vessel Right Left  Radial Palpable Palpable  PT Palpable Palpable  DP Palpable Palpable  Gastrointestinal: Non-distended. No guarding/no peritoneal signs.  Musculoskeletal: M/S 5/5 throughout.  No deformity or atrophy.  Neurologic: CN 2-12 intact. Symmetrical.  Speech is fluent. Motor exam as listed above. Psychiatric: Judgment intact, Mood & affect appropriate for pt's clinical situation. Dermatologic: No rashes or ulcers noted.  No changes consistent with cellulitis.   CBC No results found for: WBC, HGB, HCT, MCV, PLT  BMET No results found for: NA, K, CL, CO2, GLUCOSE, BUN, CREATININE, CALCIUM, GFRNONAA, GFRAA CrCl cannot be calculated (No successful lab value found.).  COAG No results found for: INR, PROTIME  Radiology No results found.   Assessment/Plan 1. Atherosclerosis of native artery of left lower extremity with gangrene (HCC)  Recommend:  The patient has evidence of severe atherosclerotic changes of both lower extremities associated with ulceration and tissue loss of the left foot.  This represents a limb threatening ischemia and places the patient at the risk for left  limb loss.  Patient should undergo angiography of the left lower extremities with the hope for intervention for left  limb salvage.  The risks and benefits as well as the alternative therapies was discussed in detail with the patient.  All questions were answered.  Patient agrees to proceed with left leg angiography.  The patient will follow up with me in the office after  the procedure.    2. Essential hypertension Continue antihypertensive medications as already ordered, these medications have been reviewed and there are no changes at this time.   3. Type 2 diabetes mellitus with diabetic peripheral angiopathy with gangrene, unspecified whether long term insulin use (HCC) Continue hypoglycemic medications as already ordered, these medications have been reviewed and there are no changes at this time.  Hgb A1C to be monitored as already arranged by primary service   , MD  08/05/2020 7:52 AM

## 2020-08-05 NOTE — Progress Notes (Signed)
K replaced with Kdur as ordered.

## 2020-08-05 NOTE — Op Note (Signed)
[facility] Percutaneous Study/Intervention Procedural Note   Date of Surgery: 08/05/2020  Surgeon: Hortencia Pilar  Pre-operative Diagnosis: Atherosclerotic occlusive disease bilateral lower extremities with gangrenous changes to the left foot  Post-operative diagnosis: Same  Procedure(s) Performed: 1. Introduction catheter into left lower extremity 3rd order catheter placement  2. Contrast injection left lower extremity for distal runoff with additional 3rd order  3. Percutaneous transluminal angioplasty left dorsalis pedis to a maximum of 3 mm              4. Star close closure right common femoral arteriotomy  Anesthesia: Conscious sedation was administered under my direct supervision by the interventional radiology RN. IV Versed plus fentanyl were utilized. Continuous ECG, pulse oximetry and blood pressure was monitored throughout the entire procedure.  Conscious sedation was for a total of 1 hour4 minutes.  Sheath: 6 French 90 cm destination sheath right common femoral retrograde  Contrast: 55 cc  Fluoroscopy Time: 8.1 minutes  Indications: William Melton presents with increasing pain of the left lower extremity associated with gangrenous changes to the toes.  Noninvasive studies as well as physical exam support significant atherosclerotic occlusive disease.  This suggests the patient is having limb threatening ischemia. The risks and benefits are reviewed all questions answered patient agrees to proceed.  Procedure:William Melton is a 69 y.o. y.o. male who was identified and appropriate procedural time out was performed. The patient was then placed supine on the table and prepped and draped in the usual sterile fashion.   Ultrasound was placed in the sterile sleeve and the right groin was evaluated the right common femoral artery was echolucent and pulsatile indicating patency. Image was recorded  for the permanent record and under real-time visualization a microneedle was inserted into the common femoral artery followed by the microwire and then the micro-sheath. A J-wire was then advanced through the micro-sheath and a 5 [place] sheath was then inserted over a J-wire. J-wire was then advanced and a 5 French pigtail catheter was positioned at the level of T12.  AP projection of the aorta was then obtained. Pigtail catheter was repositioned to above the bifurcation and a RAO view of the pelvis was obtained. Subsequently a pigtail catheter with the stiff angle Glidewire was used to cross the aortic bifurcation the catheter wire were advanced down into the left distal external iliac artery. Oblique view of the femoral bifurcation was then obtained and subsequently the wire was reintroduced and the pigtail catheter negotiated into the SFA representing third order catheter placement. Distal runoff was then performed.  5000 units of heparin was then given and allowed to circulate and a 6 Fr 90 cm Destination sheath was advanced up and over the bifurcation and positioned in the left femoral artery  Vertebral catheter and advantage Glidewire were then negotiated down into the distal popliteal. Catheter was then advanced. Hand injection contrast demonstrated the tibial anatomy in detail.  Diagnostic interpretation: The abdominal aorta is opacified by bolus injection of contrast.  There are no hemodynamically significant stenoses or lesions noted.  The bilateral common and external iliac arteries are widely patent.  The left common femoral profunda femoris and superficial femoral artery as well as the popliteal demonstrate mild atherosclerotic changes but there are no hemodynamically significant stenoses.  Trifurcation is patent however the posterior tibial occludes in its midportion and the does not appear to be any reconstitution at the level of the plantar arteries.  The peroneal occludes shortly after  its  origin.  The anterior tibial demonstrates a greater than 80% stenosis in its proximal one third.  Distally at the level of the ankle it occludes but there is reconstitution of the dorsalis pedis.  A 2 mm x 30 cm Ultraverse balloon was used to angioplasty the from the mid dorsalis pedis proximally.  Inflation was to 12 atm for 1 minute.  Next a 2.5 mm x 100 mm ultra score balloon was advanced across the ankle in the distal anterior tibial was angioplastied to 12 atm for 1 minute.  Lastly the proximal lesion was treated with a 3 mm x 40 mm ultra score balloon inflated to 12 atm for 1 minute.   Distal runoff was then reassessed and noted to be widely patent there is less than 10% residual stenosis throughout the entire length of the anterior tibial filling the dorsalis pedis..   After review of these images the sheath is pulled into the right external iliac oblique of the common femoral is obtained and a Star close device deployed. There no immediate Complications.  Findings:  The abdominal aorta is opacified by bolus injection of contrast.  There are no hemodynamically significant stenoses or lesions noted.  The bilateral common and external iliac arteries are widely patent.  The left common femoral profunda femoris and superficial femoral artery as well as the popliteal demonstrate mild atherosclerotic changes but there are no hemodynamically significant stenoses.  Trifurcation is patent however the posterior tibial occludes in its midportion and the does not appear to be any reconstitution at the level of the plantar arteries.  The peroneal occludes shortly after its origin.  The anterior tibial demonstrates a greater than 80% stenosis in its proximal one third.  Distally at the level of the ankle it occludes but there is reconstitution of the dorsalis pedis.  Following angioplasty the anterior tibial now is now widely patent and demonstrates in-line flow with less than 10% residual stenosis.      Summary: Successful recanalization left lower extremity for limb salvage   Disposition: Patient was taken to the recovery room in stable condition having tolerated the procedure well.  William Melton 08/05/2020,9:23 AM

## 2020-08-13 ENCOUNTER — Telehealth (INDEPENDENT_AMBULATORY_CARE_PROVIDER_SITE_OTHER): Payer: Self-pay

## 2020-08-13 NOTE — Telephone Encounter (Signed)
A message was left for a return call to the patient's son regarding the patient having pain in his legs. I attempted to contact the son and a message was left for a return call.

## 2020-08-13 NOTE — Telephone Encounter (Signed)
Patient's son called back stating that the patient needed a follow up from his leg angio on 08/05/20. Patient will come in to have a ABI and follow up with the provider.

## 2020-08-15 ENCOUNTER — Ambulatory Visit (INDEPENDENT_AMBULATORY_CARE_PROVIDER_SITE_OTHER): Payer: Medicare HMO | Admitting: Nurse Practitioner

## 2020-08-15 ENCOUNTER — Other Ambulatory Visit (INDEPENDENT_AMBULATORY_CARE_PROVIDER_SITE_OTHER): Payer: Self-pay | Admitting: Vascular Surgery

## 2020-08-15 ENCOUNTER — Encounter (INDEPENDENT_AMBULATORY_CARE_PROVIDER_SITE_OTHER): Payer: Medicare HMO

## 2020-08-15 DIAGNOSIS — I70262 Atherosclerosis of native arteries of extremities with gangrene, left leg: Secondary | ICD-10-CM

## 2020-08-15 DIAGNOSIS — Z9862 Peripheral vascular angioplasty status: Secondary | ICD-10-CM

## 2020-08-16 ENCOUNTER — Encounter: Payer: Self-pay | Admitting: Emergency Medicine

## 2020-08-16 ENCOUNTER — Emergency Department: Payer: Medicare HMO

## 2020-08-16 ENCOUNTER — Other Ambulatory Visit: Payer: Self-pay

## 2020-08-16 ENCOUNTER — Inpatient Hospital Stay
Admission: EM | Admit: 2020-08-16 | Discharge: 2020-08-25 | DRG: 239 | Disposition: A | Discharge status: Skilled Nursing Facility | Payer: Medicare HMO | Attending: Internal Medicine | Admitting: Internal Medicine

## 2020-08-16 DIAGNOSIS — L03032 Cellulitis of left toe: Secondary | ICD-10-CM | POA: Diagnosis present

## 2020-08-16 DIAGNOSIS — I739 Peripheral vascular disease, unspecified: Secondary | ICD-10-CM | POA: Diagnosis not present

## 2020-08-16 DIAGNOSIS — Z8249 Family history of ischemic heart disease and other diseases of the circulatory system: Secondary | ICD-10-CM | POA: Diagnosis not present

## 2020-08-16 DIAGNOSIS — Z825 Family history of asthma and other chronic lower respiratory diseases: Secondary | ICD-10-CM | POA: Diagnosis not present

## 2020-08-16 DIAGNOSIS — Z794 Long term (current) use of insulin: Secondary | ICD-10-CM | POA: Diagnosis not present

## 2020-08-16 DIAGNOSIS — N179 Acute kidney failure, unspecified: Secondary | ICD-10-CM | POA: Diagnosis not present

## 2020-08-16 DIAGNOSIS — M1990 Unspecified osteoarthritis, unspecified site: Secondary | ICD-10-CM | POA: Diagnosis present

## 2020-08-16 DIAGNOSIS — Z09 Encounter for follow-up examination after completed treatment for conditions other than malignant neoplasm: Secondary | ICD-10-CM

## 2020-08-16 DIAGNOSIS — Z9862 Peripheral vascular angioplasty status: Secondary | ICD-10-CM

## 2020-08-16 DIAGNOSIS — Z79899 Other long term (current) drug therapy: Secondary | ICD-10-CM

## 2020-08-16 DIAGNOSIS — E119 Type 2 diabetes mellitus without complications: Secondary | ICD-10-CM

## 2020-08-16 DIAGNOSIS — A480 Gas gangrene: Secondary | ICD-10-CM | POA: Diagnosis present

## 2020-08-16 DIAGNOSIS — Z7902 Long term (current) use of antithrombotics/antiplatelets: Secondary | ICD-10-CM | POA: Diagnosis not present

## 2020-08-16 DIAGNOSIS — Z20822 Contact with and (suspected) exposure to covid-19: Secondary | ICD-10-CM | POA: Diagnosis present

## 2020-08-16 DIAGNOSIS — I70248 Atherosclerosis of native arteries of left leg with ulceration of other part of lower left leg: Secondary | ICD-10-CM | POA: Diagnosis present

## 2020-08-16 DIAGNOSIS — I152 Hypertension secondary to endocrine disorders: Secondary | ICD-10-CM | POA: Diagnosis present

## 2020-08-16 DIAGNOSIS — E872 Acidosis: Secondary | ICD-10-CM | POA: Diagnosis not present

## 2020-08-16 DIAGNOSIS — Z720 Tobacco use: Secondary | ICD-10-CM

## 2020-08-16 DIAGNOSIS — E1142 Type 2 diabetes mellitus with diabetic polyneuropathy: Secondary | ICD-10-CM | POA: Diagnosis present

## 2020-08-16 DIAGNOSIS — E876 Hypokalemia: Secondary | ICD-10-CM | POA: Diagnosis present

## 2020-08-16 DIAGNOSIS — I96 Gangrene, not elsewhere classified: Secondary | ICD-10-CM

## 2020-08-16 DIAGNOSIS — N183 Chronic kidney disease, stage 3 unspecified: Secondary | ICD-10-CM

## 2020-08-16 DIAGNOSIS — Z83438 Family history of other disorder of lipoprotein metabolism and other lipidemia: Secondary | ICD-10-CM | POA: Diagnosis not present

## 2020-08-16 DIAGNOSIS — E785 Hyperlipidemia, unspecified: Secondary | ICD-10-CM | POA: Diagnosis present

## 2020-08-16 DIAGNOSIS — Z7982 Long term (current) use of aspirin: Secondary | ICD-10-CM

## 2020-08-16 DIAGNOSIS — F17210 Nicotine dependence, cigarettes, uncomplicated: Secondary | ICD-10-CM | POA: Diagnosis present

## 2020-08-16 DIAGNOSIS — E1122 Type 2 diabetes mellitus with diabetic chronic kidney disease: Secondary | ICD-10-CM | POA: Diagnosis present

## 2020-08-16 DIAGNOSIS — M868X7 Other osteomyelitis, ankle and foot: Secondary | ICD-10-CM | POA: Diagnosis present

## 2020-08-16 DIAGNOSIS — N189 Chronic kidney disease, unspecified: Secondary | ICD-10-CM | POA: Diagnosis not present

## 2020-08-16 DIAGNOSIS — E1169 Type 2 diabetes mellitus with other specified complication: Secondary | ICD-10-CM | POA: Diagnosis present

## 2020-08-16 DIAGNOSIS — I70262 Atherosclerosis of native arteries of extremities with gangrene, left leg: Secondary | ICD-10-CM | POA: Diagnosis not present

## 2020-08-16 DIAGNOSIS — E1152 Type 2 diabetes mellitus with diabetic peripheral angiopathy with gangrene: Principal | ICD-10-CM | POA: Diagnosis present

## 2020-08-16 DIAGNOSIS — I10 Essential (primary) hypertension: Secondary | ICD-10-CM

## 2020-08-16 DIAGNOSIS — I70269 Atherosclerosis of native arteries of extremities with gangrene, unspecified extremity: Secondary | ICD-10-CM | POA: Diagnosis present

## 2020-08-16 DIAGNOSIS — N1831 Chronic kidney disease, stage 3a: Secondary | ICD-10-CM | POA: Diagnosis present

## 2020-08-16 HISTORY — DX: Chronic kidney disease, stage 3 unspecified: N18.30

## 2020-08-16 LAB — LACTIC ACID, PLASMA
Lactic Acid, Venous: 1.4 mmol/L (ref 0.5–1.9)
Lactic Acid, Venous: 2 mmol/L (ref 0.5–1.9)

## 2020-08-16 LAB — COMPREHENSIVE METABOLIC PANEL
ALT: 11 U/L (ref 0–44)
AST: 16 U/L (ref 15–41)
Albumin: 2.5 g/dL — ABNORMAL LOW (ref 3.5–5.0)
Alkaline Phosphatase: 72 U/L (ref 38–126)
Anion gap: 14 (ref 5–15)
BUN: 31 mg/dL — ABNORMAL HIGH (ref 8–23)
CO2: 24 mmol/L (ref 22–32)
Calcium: 9.1 mg/dL (ref 8.9–10.3)
Chloride: 99 mmol/L (ref 98–111)
Creatinine, Ser: 1.81 mg/dL — ABNORMAL HIGH (ref 0.61–1.24)
GFR, Estimated: 40 mL/min — ABNORMAL LOW (ref >60)
Glucose, Bld: 81 mg/dL (ref 70–99)
Potassium: 3 mmol/L — ABNORMAL LOW (ref 3.5–5.1)
Sodium: 137 mmol/L (ref 135–145)
Total Bilirubin: 1.3 mg/dL — ABNORMAL HIGH (ref 0.3–1.2)
Total Protein: 7.8 g/dL (ref 6.5–8.1)

## 2020-08-16 LAB — CBC
HCT: 40.2 % (ref 39.0–52.0)
Hemoglobin: 12.9 g/dL — ABNORMAL LOW (ref 13.0–17.0)
MCH: 25.7 pg — ABNORMAL LOW (ref 26.0–34.0)
MCHC: 32.1 g/dL (ref 30.0–36.0)
MCV: 80.1 fL (ref 80.0–100.0)
Platelets: 445 10*3/uL — ABNORMAL HIGH (ref 150–400)
RBC: 5.02 MIL/uL (ref 4.22–5.81)
RDW: 14.6 % (ref 11.5–15.5)
WBC: 13 10*3/uL — ABNORMAL HIGH (ref 4.0–10.5)
nRBC: 0 % (ref 0.0–0.2)

## 2020-08-16 LAB — RESP PANEL BY RT-PCR (FLU A&B, COVID) ARPGX2
Influenza A by PCR: NEGATIVE
Influenza B by PCR: NEGATIVE
SARS Coronavirus 2 by RT PCR: NEGATIVE

## 2020-08-16 LAB — PROTIME-INR
INR: 1.2 (ref 0.8–1.2)
Prothrombin Time: 14.5 seconds (ref 11.4–15.2)

## 2020-08-16 LAB — APTT: aPTT: 41 seconds — ABNORMAL HIGH (ref 24–36)

## 2020-08-16 LAB — SEDIMENTATION RATE: Sed Rate: 44 mm/hr — ABNORMAL HIGH (ref 0–20)

## 2020-08-16 MED ORDER — SENNOSIDES-DOCUSATE SODIUM 8.6-50 MG PO TABS: 1.0000 | ORAL_TABLET | Freq: Every evening | ORAL | Status: DC | PRN

## 2020-08-16 MED ORDER — ONDANSETRON HCL 4 MG/2ML IJ SOLN: 4.0000 mg | Freq: Four times a day (QID) | INTRAMUSCULAR | Status: DC | PRN

## 2020-08-16 MED ORDER — CLINDAMYCIN PHOSPHATE 600 MG/50ML IV SOLN
600.0000 mg | Freq: Three times a day (TID) | INTRAVENOUS | Status: DC
  Administered 2020-08-16 – 2020-08-18 (×5): 600 mg via INTRAVENOUS
  Filled 2020-08-16 (×7): qty 50

## 2020-08-16 MED ORDER — VANCOMYCIN HCL 1250 MG/250ML IV SOLN
1250.0000 mg | INTRAVENOUS | Status: DC
  Administered 2020-08-17 – 2020-08-18 (×2): 1250 mg via INTRAVENOUS
  Filled 2020-08-16 (×3): qty 250

## 2020-08-16 MED ORDER — AMLODIPINE BESYLATE 10 MG PO TABS
10.0000 mg | ORAL_TABLET | Freq: Every day | ORAL | Status: DC
  Administered 2020-08-17 – 2020-08-25 (×8): 10 mg via ORAL
  Filled 2020-08-16 (×8): qty 1

## 2020-08-16 MED ORDER — INSULIN ASPART 100 UNIT/ML ~~LOC~~ SOLN: 0.0000 [IU] | SUBCUTANEOUS | Status: DC

## 2020-08-16 MED ORDER — INSULIN ASPART 100 UNIT/ML ~~LOC~~ SOLN
0.0000 [IU] | Freq: Three times a day (TID) | SUBCUTANEOUS | Status: DC
  Administered 2020-08-17 – 2020-08-19 (×2): 2 [IU] via SUBCUTANEOUS
  Administered 2020-08-21: 18:00:00 1 [IU] via SUBCUTANEOUS
  Administered 2020-08-23: 18:00:00 2 [IU] via SUBCUTANEOUS
  Administered 2020-08-24: 11:00:00 1 [IU] via SUBCUTANEOUS
  Administered 2020-08-24 – 2020-08-25 (×2): 2 [IU] via SUBCUTANEOUS
  Filled 2020-08-16 (×8): qty 1

## 2020-08-16 MED ORDER — NICOTINE 21 MG/24HR TD PT24
21.0000 mg | MEDICATED_PATCH | Freq: Every day | TRANSDERMAL | Status: DC
  Administered 2020-08-16 – 2020-08-25 (×10): 21 mg via TRANSDERMAL
  Filled 2020-08-16 (×10): qty 1

## 2020-08-16 MED ORDER — VANCOMYCIN HCL IN DEXTROSE 1-5 GM/200ML-% IV SOLN
1000.0000 mg | Freq: Once | INTRAVENOUS | Status: AC
  Administered 2020-08-16: 22:00:00 1000 mg via INTRAVENOUS
  Filled 2020-08-16: qty 200

## 2020-08-16 MED ORDER — OXYCODONE-ACETAMINOPHEN 5-325 MG PO TABS
1.0000 | ORAL_TABLET | Freq: Once | ORAL | Status: AC
Start: 2020-08-16 — End: 2020-08-16
  Administered 2020-08-16: 19:00:00 1 via ORAL
  Filled 2020-08-16: qty 1

## 2020-08-16 MED ORDER — SODIUM CHLORIDE 0.9 % IV SOLN: 2.0000 g | Freq: Once | INTRAVENOUS | Status: DC

## 2020-08-16 MED ORDER — SODIUM CHLORIDE 0.9 % IV SOLN
1.0000 g | Freq: Two times a day (BID) | INTRAVENOUS | Status: DC
  Administered 2020-08-16 – 2020-08-19 (×6): 1 g via INTRAVENOUS
  Filled 2020-08-16 (×7): qty 1

## 2020-08-16 MED ORDER — LACTATED RINGERS IV SOLN: INTRAVENOUS | Status: AC

## 2020-08-16 MED ORDER — ACETAMINOPHEN 325 MG PO TABS: 650.0000 mg | ORAL_TABLET | Freq: Four times a day (QID) | ORAL | Status: DC | PRN

## 2020-08-16 MED ORDER — VANCOMYCIN HCL IN DEXTROSE 1-5 GM/200ML-% IV SOLN
1000.0000 mg | Freq: Once | INTRAVENOUS | Status: AC
  Administered 2020-08-16: 20:00:00 1000 mg via INTRAVENOUS
  Filled 2020-08-16: qty 200

## 2020-08-16 MED ORDER — HYDROMORPHONE HCL 1 MG/ML IJ SOLN
0.5000 mg | INTRAMUSCULAR | Status: DC | PRN
Start: 2020-08-16 — End: 2020-08-24
  Administered 2020-08-17 – 2020-08-22 (×2): 0.5 mg via INTRAVENOUS
  Filled 2020-08-16 (×2): qty 0.5

## 2020-08-16 MED ORDER — ACETAMINOPHEN 650 MG RE SUPP: 650.0000 mg | Freq: Four times a day (QID) | RECTAL | Status: DC | PRN

## 2020-08-16 MED ORDER — POTASSIUM CHLORIDE CRYS ER 20 MEQ PO TBCR
40.0000 meq | EXTENDED_RELEASE_TABLET | Freq: Once | ORAL | Status: AC
  Administered 2020-08-16: 19:00:00 40 meq via ORAL
  Filled 2020-08-16: qty 2

## 2020-08-16 MED ORDER — LABETALOL HCL 5 MG/ML IV SOLN
10.0000 mg | INTRAVENOUS | Status: DC | PRN
  Administered 2020-08-16 – 2020-08-21 (×3): 10 mg via INTRAVENOUS
  Filled 2020-08-16 (×3): qty 4

## 2020-08-16 MED ORDER — INSULIN GLARGINE 100 UNIT/ML ~~LOC~~ SOLN
20.0000 [IU] | Freq: Every day | SUBCUTANEOUS | Status: DC
  Administered 2020-08-16 – 2020-08-23 (×7): 20 [IU] via SUBCUTANEOUS
  Filled 2020-08-16 (×8): qty 0.2

## 2020-08-16 MED ORDER — PRAVASTATIN SODIUM 20 MG PO TABS
20.0000 mg | ORAL_TABLET | Freq: Every day | ORAL | Status: DC
  Administered 2020-08-17 – 2020-08-24 (×7): 20 mg via ORAL
  Filled 2020-08-16 (×7): qty 1

## 2020-08-16 MED ORDER — HYDROCODONE-ACETAMINOPHEN 5-325 MG PO TABS
1.0000 | ORAL_TABLET | ORAL | Status: DC | PRN
  Administered 2020-08-17: 1 via ORAL
  Administered 2020-08-17 – 2020-08-21 (×8): 2 via ORAL
  Administered 2020-08-21 (×2): 1 via ORAL
  Administered 2020-08-21: 2 via ORAL
  Administered 2020-08-22: 1 via ORAL
  Administered 2020-08-23 – 2020-08-24 (×3): 2 via ORAL
  Filled 2020-08-16: qty 2
  Filled 2020-08-16: qty 1
  Filled 2020-08-16 (×4): qty 2
  Filled 2020-08-16 (×2): qty 1
  Filled 2020-08-16 (×8): qty 2

## 2020-08-16 MED ORDER — ONDANSETRON HCL 4 MG PO TABS: 4.0000 mg | ORAL_TABLET | Freq: Four times a day (QID) | ORAL | Status: DC | PRN

## 2020-08-16 NOTE — H&P (Addendum)
History and Physical    William Melton TWS:[id number] DOB: [birth year] DOA: 08/16/2020  PCP: Joyice Faster, FNP  Patient coming from: Home  I have personally briefly reviewed patient's old medical records in [place]  Chief Complaint: Left foot infection  HPI: RAWAD BOCHICCHIO is a 69 y.o. male with medical history significant for PVD with left foot gangrene (s/p angioplasty left dorsalis pedis 08/05/2020), IDT2DM, CKD stage III, HTN, HLD, and tobacco use who presents to the ED for evaluation of worsening left foot infection.  Patient underwent left lower extremity angiography with percutaneous transluminal angioplasty of left dorsalis pedis on 08/05/2020 by vascular surgery, Dr. Delana Meyer, for limb salvage.  Patient states he had initially been doing well afterwards however has been having increasing pain in his left fourth and fifth toes.  He noticed some bleeding from the area but no purulent discharge.  He has not been having any subjective fevers, chills, chest pain, dyspnea, palpitations, nausea, vomiting, diaphoresis, abdominal pain, or dysuria.  He says he is a current smoker of about 1 pack/day.  He came to the hospital due to worsening pain and appearance of his left fourth and fifth toes.  ED Course:  Initial vitals showed BP 129/73, pulse 84, RR 18, temp 97.7 F, SPO2 97% on room air.  Labs show WBC 13.0, hemoglobin 12.9, platelets 445,000, sodium 137, potassium 3.0, bicarb 24, BUN 31, creatinine 1.81, serum glucose 81, ESR 44.  Blood cultures are ordered and pending.  SARS-CoV-2 PCR panel is ordered and pending.  Left foot x-ray shows atrophic soft tissues of the fourth and fifth digits with patchy soft tissue air tracking approximately consistent with gas gangrene.  No gross radiographic findings of osteomyelitis.  Portable chest x-ray is negative for focal consolidation, edema, or effusion.  Patient was ordered to receive vancomycin, cefepime, Percocet, potassium.  Per  EDP, case was discussed with on-call vascular surgery Dr. Lorenso Courier who will consult on patient and requested hospitalist admission.  The hospitalist service was consulted for further evaluation and management.  Review of Systems: All systems reviewed and are negative except as documented in history of present illness above.   Past Medical History:  Diagnosis Date  . Arthritis   . Diabetes mellitus without complication ([place])   . Hyperlipidemia   . Hypertension     Past Surgical History:  Procedure Laterality Date  . LOWER EXTREMITY ANGIOGRAPHY Left 08/05/2020   Procedure: LOWER EXTREMITY ANGIOGRAPHY;  Surgeon: Katha Cabal, MD;  Location: [facility];  Service: Cardiovascular;  Laterality: Left;    Social History:  reports that he has been smoking cigarettes. He has been smoking about 1.00 pack per day. He does not have any smokeless tobacco history on file. No history on file for alcohol use and drug use.  No Known Allergies  Family History  Problem Relation Age of Onset  . Heart disease Mother   . COPD Father   . Hyperlipidemia Father      Prior to Admission medications   Medication Sig Start Date End Date Taking? Authorizing Provider  allopurinol (ZYLOPRIM) 100 MG tablet Take 100 mg by mouth daily.     [provider]  amLODipine (NORVASC) 10 MG tablet Take 10 mg by mouth daily.     [provider]  aspirin EC 81 MG tablet Take 1 tablet (81 mg total) by mouth daily. Swallow whole. 08/05/20 08/05/21  Schnier, Dolores Lory, MD  clopidogrel (PLAVIX) 75 MG tablet Take 1 tablet (  75 mg total) by mouth daily. 08/05/20   Schnier, Dolores Lory, MD  indomethacin (INDOCIN) 50 MG capsule Take 50 mg by mouth daily. 04/14/20   [provider]  insulin glargine (LANTUS) 100 UNIT/ML injection Inject 50 Units into the skin daily.     [provider]  lovastatin (MEVACOR) 20 MG tablet Take 20 mg by mouth at bedtime. 06/27/20   [provider]     Physical Exam: Vitals:   08/16/20 1716 08/16/20 1718  BP: 129/73   Pulse: 85   Resp: 18   Temp: 97.7 F (36.5 C)   TempSrc: Oral   SpO2: 97%   Weight:  86.2 kg  Height:  6' (1.829 m)   Constitutional: Resting in bed with head elevated, NAD, calm, comfortable Eyes: PERRL, lids and conjunctivae normal ENMT: Mucous membranes are moist. Posterior pharynx clear of any exudate or lesions. Neck: normal, supple, no masses. Respiratory: clear to auscultation bilaterally, no wheezing, no crackles. Normal respiratory effort. No accessory muscle use.  Cardiovascular: Regular rate and rhythm, no murmurs / rubs / gallops.  +1 pedal pulse right foot, difficult to palpate pedal pulse left foot. Abdomen: no tenderness, no masses palpated. No hepatosplenomegaly. Bowel sounds positive.  Musculoskeletal: Necrotic left fourth and fifth toes.  Range of motion and strength intact upper and lower extremities. Skin: Necrotic fourth and fifth toes of the left foot as pictured below.  No active discharge present. Neurologic: CN 2-12 grossly intact. Sensation intact, Strength 5/5 in all 4.  Psychiatric: Alert and oriented x 3. Normal mood.       Labs on Admission: I have personally reviewed following labs and imaging studies  CBC: Recent Labs  Lab 08/16/20 1721  WBC 13.0*  HGB 12.9*  HCT 40.2  MCV 80.1  PLT 620*   Basic Metabolic Panel: Recent Labs  Lab 08/16/20 1721  NA 137  K 3.0*  CL 99  CO2 24  GLUCOSE 81  BUN 31*  CREATININE 1.81*  CALCIUM 9.1   GFR: Estimated Creatinine Clearance: 43.5 mL/min (A) (by C-G formula based on SCr of 1.81 mg/dL (H)). Liver Function Tests: Recent Labs  Lab 08/16/20 1721  AST 16  ALT 11  ALKPHOS 72  BILITOT 1.3*  PROT 7.8  ALBUMIN 2.5*   No results for input(s): LIPASE, AMYLASE in the last 168 hours. No results for input(s): AMMONIA in the last 168 hours. Coagulation Profile: No results for input(s): INR, PROTIME in the last 168  hours. Cardiac Enzymes: No results for input(s): CKTOTAL, CKMB, CKMBINDEX, TROPONINI in the last 168 hours. BNP (last 3 results) No results for input(s): PROBNP in the last 8760 hours. HbA1C: No results for input(s): HGBA1C in the last 72 hours. CBG: No results for input(s): GLUCAP in the last 168 hours. Lipid Profile: No results for input(s): CHOL, HDL, LDLCALC, TRIG, CHOLHDL, LDLDIRECT in the last 72 hours. Thyroid Function Tests: No results for input(s): TSH, T4TOTAL, FREET4, T3FREE, THYROIDAB in the last 72 hours. Anemia Panel: No results for input(s): VITAMINB12, FOLATE, FERRITIN, TIBC, IRON, RETICCTPCT in the last 72 hours. Urine analysis: No results found for: COLORURINE, APPEARANCEUR, LABSPEC, [place], GLUCOSEU, HGBUR, BILIRUBINUR, KETONESUR, PROTEINUR, UROBILINOGEN, NITRITE, LEUKOCYTESUR  Radiological Exams on Admission: DG Foot Complete Left  Result Date: 08/16/2020 CLINICAL DATA:  Infection to left fourth and fifth toes for 1 week. Diabetes. EXAM: LEFT FOOT - COMPLETE 3+ VIEW COMPARISON:  Radiograph 06/17/2020 FINDINGS: Atrophic soft tissues of the fourth and fifth digits with patchy subcutaneous gas. Soft  tissue air is also seen tracking proximally adjacent to the distal fifth metatarsal head. Soft tissue gas obscures detailed bony assessment, there is no gross from bony destruction. No radiopaque foreign body. Unchanged appearance of great toe proximal phalanx fracture with well-defined margins and erosion involving the medial aspect. The bones are diffusely under mineralized. No acute fracture. Plantar calcaneal spur and Achilles tendon enthesophyte. There are vascular calcifications. IMPRESSION: 1. Atrophic soft tissues of the fourth and fifth digits with patchy soft tissue air which tracks proximally adjacent to the distal fifth metatarsal head. Findings consistent with gas gangrene. No gross radiographic findings of osteomyelitis. 2. Unchanged appearance of the chronic great  toe proximal phalanx fracture. Electronically Signed   By: Keith Rake M.D.   On: 08/16/2020 18:12    EKG: Ordered and pending.  Assessment/Plan Principal Problem:   Gas gangrene of foot ([place]) Active Problems:   Atherosclerosis of native arteries of the extremities with gangrene ([place])   Hypertension associated with diabetes ([place])   Diabetes ([place])   Hypokalemia   CKD (chronic kidney disease) stage 3, GFR 30-59 ml/min (HCC)   Hyperlipidemia associated with type 2 diabetes mellitus ([place])  SHERIDAN GETTEL is a 69 y.o. male with medical history significant for PVD with left foot gangrene (s/p angioplasty left dorsalis pedis 08/05/2020), IDT2DM, CKD stage III, HTN, HLD, and tobacco use who is admitted for gas gangrene of left foot.  PVD with necrotic left fourth and fifth toes and gas gangrene: Underwent recent angioplasty left dorsalis pedis 08/05/2020 now returning with x-ray findings consistent with gas gangrene of the fourth and fifth left toes.  WBC mildly elevated however no other evidence of sepsis physiology at time of admission. -Vascular surgery consulted, no urgent vascular intervention planned today -Podiatry consult -Obtain blood cultures -Broaden antibiotics to IV vancomycin, meropenem, clindamycin -Keep n.p.o. at midnight and will hold blood thinners for now -Analgesics as needed for pain control  CKD stage III: Appears stable relative to recent labs available.  Placed on IV fluid hydration overnight and follow labs.  Hypokalemia: Oral supplement given.  Repeat labs in a.m.  Insulin-dependent type 2 diabetes: Place on reduced home Lantus 20 units nightly plus sensitive SSI.  Hypertension: Continue amlodipine.  Hyperlipidemia: Continue statin.  Tobacco use: Patient advised on smoking cessation.  Nicotine patch ordered.  DVT prophylaxis: SCDs Code Status: Full code, confirmed with patient Family Communication: Discussed with patient, he has discussed with  family Disposition Plan: From home and likely discharge to home pending vascular surgery evaluation/intervention Consults called: Vascular surgery Admission status:  Status is: Inpatient  Remains inpatient appropriate because:IV treatments appropriate due to intensity of illness or inability to take PO and Inpatient level of care appropriate due to severity of illness   Dispo: The patient is from: Home              Anticipated d/c is to: Home              Anticipated d/c date is: 3 days              Patient currently is not medically stable to d/c.   Zada Finders MD [facility]  If 7PM-7AM, please contact night-coverage [email or web address]  08/16/2020, 6:55 PM

## 2020-08-16 NOTE — Consult Note (Signed)
Pharmacy Antibiotic Note  William Melton is a 69 y.o. male admitted on 08/16/2020 with Gas gangrene left foot.  Pharmacy has been consulted for Vancomycin dosing. Vancomycin 1g ordered in the ED.  Patient will be on clindamycin and meropenem as well.   Plan: Will order an additional Vancomycin 1g x1 for a total loading dose of 2g. Maintenance dose 1250 mg Q24H (expected Cssmin 13 mcg/mL). Will check Scr with AM labs.    Height: 6' (182.9 cm) Weight: 86.2 kg (190 lb) IBW/kg (Calculated) : 77.6  Temp (24hrs), Avg:97.7 F (36.5 C), Min:97.7 F (36.5 C), Max:97.7 F (36.5 C)  Recent Labs  Lab 08/16/20 1721  WBC 13.0*  CREATININE 1.81*    Estimated Creatinine Clearance: 43.5 mL/min (A) (by C-G formula based on SCr of 1.81 mg/dL (H)).    No Known Allergies    Thank you for allowing pharmacy to be a part of this patient's care.  Katha Cabal 08/16/2020 7:25 PM

## 2020-08-16 NOTE — ED Provider Notes (Signed)
[facility] Emergency Department Provider Note  ____________________________________________   Event Date/Time   First MD Initiated Contact with Patient 08/16/20 1819     (approximate)  I have reviewed the triage vital signs and the nursing notes.   HISTORY  Chief Complaint Wound Infection   HPI William Melton is a 69 y.o. male with past medical history of HTN, HDL, DM, CKD, arthritis, and PVD status post Left lower extremity left lower extremity angioplasty of the left dorsalis pedis pulse on 11/30 who presents for assessment of worsening pain redness swelling and darkening of the fourth and fifth toes over the past week.  Patient denies any interim injuries or falls.  He denies any other sick symptoms including fevers, chills, cough, nausea, vomiting, diarrhea, dysuria, rash, chest pain, back pain, urinary symptoms or any pain redness or swelling in his right lower extremity or upper extremities.  No prior similar episodes.         Past Medical History:  Diagnosis Date  . Arthritis   . CKD (chronic kidney disease), stage III ([place])   . Diabetes mellitus without complication ([place])   . Hyperlipidemia   . Hypertension     Patient Active Problem List   Diagnosis Date Noted  . 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 Hypokalemia 08/16/2020  . CKD (chronic kidney disease) stage 3, GFR 30-59 ml/min (HCC) 08/16/2020  . Hyperlipidemia associated with type 2 diabetes mellitus ([place]) 08/16/2020  . Atherosclerosis of native arteries of the extremities with gangrene ([place]) 06/20/2020  . Hypertension associated with diabetes ([place]) 06/20/2020  . Diabetes ([place]) 06/20/2020  . Pressure ulcer of toe of left foot 05/13/2020  . Lower limb pain, inferior, right 04/10/2020  . Edema of lower extremity 04/10/2020  . Ulcer of foot ([place]) 04/10/2020    Past Surgical History:  Procedure Laterality Date  . LOWER EXTREMITY ANGIOGRAPHY Left 08/05/2020   Procedure: LOWER  EXTREMITY ANGIOGRAPHY;  Surgeon: Katha Cabal, MD;  Location: [facility];  Service: Cardiovascular;  Laterality: Left;    Prior to Admission medications   Medication Sig Start Date End Date Taking? Authorizing Provider  allopurinol (ZYLOPRIM) 100 MG tablet Take 100 mg by mouth daily.     [provider]  amLODipine (NORVASC) 10 MG tablet Take 10 mg by mouth daily.     [provider]  aspirin EC 81 MG tablet Take 1 tablet (81 mg total) by mouth daily. Swallow whole. 08/05/20 08/05/21  Schnier, Dolores Lory, MD  clopidogrel (PLAVIX) 75 MG tablet Take 1 tablet (75 mg total) by mouth daily. 08/05/20   Schnier, Dolores Lory, MD  indomethacin (INDOCIN) 50 MG capsule Take 50 mg by mouth daily. 04/14/20   [provider]  insulin glargine (LANTUS) 100 UNIT/ML injection Inject 50 Units into the skin daily.     [provider]  lovastatin (MEVACOR) 20 MG tablet Take 20 mg by mouth at bedtime. 06/27/20   [provider]    Allergies Patient has no known allergies.  Family History  Problem Relation Age of Onset  . Heart disease Mother   . COPD Father   . Hyperlipidemia Father     Social History Social History   Tobacco Use  . Smoking status: Current Every Day Smoker    Packs/day: 1.00    Types: Cigarettes  Vaping Use  . Vaping Use: Never used    Review of Systems  Review of Systems  Constitutional: Negative for chills and  fever.  HENT: Negative for sore throat.   Eyes: Negative for pain.  Respiratory: Negative for cough and stridor.   Cardiovascular: Negative for chest pain.  Gastrointestinal: Negative for vomiting.  Genitourinary: Negative for dysuria.  Musculoskeletal: Positive for joint pain ( L foot) and myalgias ( L foot).  Skin: Negative for rash.  Neurological: Negative for seizures, loss of consciousness and headaches.  Psychiatric/Behavioral: Negative for suicidal ideas.  All other systems reviewed and are negative.      ____________________________________________   PHYSICAL EXAM:  VITAL SIGNS: ED Triage Vitals  Enc Vitals Group     BP 08/16/20 1716 129/73     Pulse Rate 08/16/20 1716 85     Resp 08/16/20 1716 18     Temp 08/16/20 1716 97.7 F (36.5 C)     Temp Source 08/16/20 1716 Oral     SpO2 08/16/20 1716 97 %     Weight 08/16/20 1718 190 lb (86.2 kg)     Height 08/16/20 1718 6' (1.829 m)     Head Circumference --      Peak Flow --      Pain Score 08/16/20 1718 8     Pain Loc --      Pain Edu? --      Excl. in [place]? --    Vitals:   08/16/20 1716  BP: 129/73  Pulse: 85  Resp: 18  Temp: 97.7 F (36.5 C)  SpO2: 97%   Physical Exam Vitals and nursing note reviewed.  Constitutional:      Appearance: He is well-developed and well-nourished.  HENT:     Head: Normocephalic and atraumatic.     Right Ear: External ear normal.     Left Ear: External ear normal.     Nose: Nose normal.  Eyes:     Conjunctiva/sclera: Conjunctivae normal.  Cardiovascular:     Rate and Rhythm: Normal rate and regular rhythm.     Heart sounds: No murmur heard.   Pulmonary:     Effort: Pulmonary effort is normal. No respiratory distress.     Breath sounds: Normal breath sounds.  Abdominal:     Palpations: Abdomen is soft.     Tenderness: There is no abdominal tenderness.  Musculoskeletal:        General: No edema.     Cervical back: Neck supple.  Skin:    General: Skin is warm and dry.  Neurological:     Mental Status: He is alert.  Psychiatric:        Mood and Affect: Mood and affect normal.     Patient's fourth toes are black with some fluid neuro blister on plantar aspect tracking proximally.  There is some erythema tenderness and warmth about the ____________________________________________   LABS (all labs ordered are listed, but only abnormal results are displayed)  Labs Reviewed  CBC - Abnormal; Notable for the following components:      Result Value   WBC 13.0 (*)     Hemoglobin 12.9 (*)    MCH 25.7 (*)    Platelets 445 (*)    All other components within normal limits  COMPREHENSIVE METABOLIC PANEL - Abnormal; Notable for the following components:   Potassium 3.0 (*)    BUN 31 (*)    Creatinine, Ser 1.81 (*)    Albumin 2.5 (*)    Total Bilirubin 1.3 (*)    GFR, Estimated 40 (*)    All other components within normal limits  SEDIMENTATION RATE - Abnormal;  Notable for the following components:   Sed Rate 44 (*)    All other components within normal limits  URINE CULTURE  CULTURE, BLOOD (ROUTINE X 2)  CULTURE, BLOOD (ROUTINE X 2)  RESP PANEL BY RT-PCR (FLU A&B, COVID) ARPGX2  LACTIC ACID, PLASMA  LACTIC ACID, PLASMA  PROTIME-INR  APTT  URINALYSIS, COMPLETE (UACMP) WITH MICROSCOPIC  C-REACTIVE PROTEIN   ____________________________________________   ____________________________________________  RADIOLOGY  ED MD interpretation: Chest x-ray is unremarkable for evidence of pneumothorax, pneumonia, thorax, effusion or other acute thoracic process.  Plain film of the left foot shows some soft tissue air which tracks approximately adjacent to the fifth metatarsal head concerning for gas gangrene.  No evidence of osteomyelitis or acute fracture.  Unchanged chronic fracture of the great toe.   Official radiology report(s): DG Chest Port 1 View  Result Date: 08/16/2020 CLINICAL DATA:  Questionable sepsis. Wound infection. Hx of DM, HTN, current smoker. EXAM: PORTABLE CHEST 1 VIEW COMPARISON:  None. FINDINGS: The cardiomediastinal contours are within normal limits. Aortic arch calcification. The lungs are clear. No pneumothorax or large pleural effusion. No acute finding in the visualized skeleton. IMPRESSION: No evidence of active disease. Electronically Signed   By: Audie Pinto M.D.   On: 08/16/2020 18:56   DG Foot Complete Left  Result Date: 08/16/2020 CLINICAL DATA:  Infection to left fourth and fifth toes for 1 week. Diabetes. EXAM: LEFT  FOOT - COMPLETE 3+ VIEW COMPARISON:  Radiograph 06/17/2020 FINDINGS: Atrophic soft tissues of the fourth and fifth digits with patchy subcutaneous gas. Soft tissue air is also seen tracking proximally adjacent to the distal fifth metatarsal head. Soft tissue gas obscures detailed bony assessment, there is no gross from bony destruction. No radiopaque foreign body. Unchanged appearance of great toe proximal phalanx fracture with well-defined margins and erosion involving the medial aspect. The bones are diffusely under mineralized. No acute fracture. Plantar calcaneal spur and Achilles tendon enthesophyte. There are vascular calcifications. IMPRESSION: 1. Atrophic soft tissues of the fourth and fifth digits with patchy soft tissue air which tracks proximally adjacent to the distal fifth metatarsal head. Findings consistent with gas gangrene. No gross radiographic findings of osteomyelitis. 2. Unchanged appearance of the chronic great toe proximal phalanx fracture. Electronically Signed   By: Keith Rake M.D.   On: 08/16/2020 18:12    ____________________________________________   PROCEDURES  Procedure(s) performed (including Critical Care):  .1-3 Lead EKG Interpretation Performed by: Lucrezia Starch, MD Authorized by: Lucrezia Starch, MD     Interpretation: normal     ECG rate assessment: normal     Rhythm: sinus rhythm     Ectopy: none     Conduction: normal       ____________________________________________   INITIAL IMPRESSION / ASSESSMENT AND PLAN / ED COURSE      Patient presents above to history exam for assessment of pain redness swelling and darkening of his fourth fifth toes after recent procedure as noted above to his left lower extremity.  He is afebrile hemodynamically stable on arrival.  There is no history exam findings suggest acute traumatic injury.  I have low suspicion for DVT given overall presentation is much more suspicious for acute infectious etiology.    CBC remarkable for leukocytosis with WBC count of 13 with no other significant derangements.  CMP with evidence of hyperkalemia with a K of 3 and chronic CKD with a creatinine of 1.8 compared to 1.9 311 days ago with no other significant ultralight or  metabolic derangements.  ESR is 44.  Given findings of gas on x-ray and concern for possible gas gangrene I immediately contacted on-call vascular surgeon Dr. Lorenso Courier stated she would see the patient and recommended possible admission.  Blood cultures and broad-spectrum IV antibiotics ordered.  Low suspicion for sepsis at this time given absence of fever hypotension tachycardia or other systemic symptoms.  Patient admitted to hospital service in stable condition for further evaluation management.        ____________________________________________   FINAL CLINICAL IMPRESSION(S) / ED DIAGNOSES  Final diagnoses:  Gangrene of extremity (HCC)  Chronic kidney disease, unspecified CKD stage    Medications  oxyCODONE-acetaminophen (PERCOCET/ROXICET) 5-325 MG per tablet 1 tablet (has no administration in time range)  potassium chloride SA (KLOR-CON) CR tablet 40 mEq (has no administration in time range)  ceFEPIme (MAXIPIME) 2 g in sodium chloride 0.9 % 100 mL IVPB (has no administration in time range)  vancomycin (VANCOCIN) IVPB 1000 mg/200 mL premix (has no administration in time range)     ED Discharge Orders    None       Note:  This document was prepared using Dragon voice recognition software and may include unintentional dictation errors.   Lucrezia Starch, MD 08/16/20 [place] Hare

## 2020-08-16 NOTE — ED Triage Notes (Signed)
Toes and foot necrotic and have foul odor.  States had a vein procedure a week ago and things have gotten worse since that time.

## 2020-08-16 NOTE — ED Triage Notes (Signed)
First Nurse Note:  C/O infection to left fourth and fifth toes x 1 week.  Hx Diabetes.   AAOx3.  Skin warm and dry. NAD

## 2020-08-16 NOTE — Consult Note (Signed)
PHARMACY -  BRIEF ANTIBIOTIC NOTE   Pharmacy has received consult(s) for Cefepime/Vancomycin from an ED provider.  The patient's profile has been reviewed for ht/wt/allergies/indication/available labs.    One time order(s) placed for Cefepime 2g X1 and Vancomycin 1g x1  Further antibiotics/pharmacy consults should be ordered by admitting physician if indicated.                       Thank you, Katha Cabal 08/16/2020  6:37 PM

## 2020-08-16 NOTE — H&P (View-Only) (Signed)
[facility] Vascular Consult Note  MRN : [id number]  William Melton is a 69 y.o. (1951/08/08) male who presents with chief complaint of  Chief Complaint  Patient presents with  . Wound Infection  .  History of Present Illness: Patient known to service- DM, CKD III, HLD, HTN, continued tobacco use s/p LEFT DP angioplasty on 11/30 for limb salvage, gangrene of LEFT 4th/5th toes. Since that time the patient developed worsening pain of the toes with scant drainage, denied purulent drainage but noted an odor. Denied claudication or rest pain in the forefoot. Denied fever, chills, shortness or breath or cough. Presented today for worsening toe pain and the encouragement of his family.   Current Facility-Administered Medications  Medication Dose Route Frequency Provider Last Rate Last Admin  . acetaminophen (TYLENOL) tablet 650 mg  650 mg Oral Q6H PRN Charlsie Quest, MD       Or  . acetaminophen (TYLENOL) suppository 650 mg  650 mg Rectal Q6H PRN Charlsie Quest, MD      . Melene Muller ON 08/17/2020] amLODipine (NORVASC) tablet 10 mg  10 mg Oral Daily Darreld Mclean R, MD      . clindamycin (CLEOCIN) IVPB 600 mg  600 mg Intravenous Q8H Patel, Vishal R, MD      . HYDROcodone-acetaminophen (NORCO/VICODIN) 5-325 MG per tablet 1-2 tablet  1-2 tablet Oral Q4H PRN Darreld Mclean R, MD      . HYDROmorphone (DILAUDID) injection 0.5 mg  0.5 mg Intravenous Q4H PRN Darreld Mclean R, MD      . insulin aspart (novoLOG) injection 0-9 Units  0-9 Units Subcutaneous Q4H Patel, Vishal R, MD      . insulin glargine (LANTUS) injection 20 Units  20 Units Subcutaneous QHS Patel, Vishal R, MD      . lactated ringers infusion   Intravenous Continuous Patel, Vishal R, MD      . meropenem (MERREM) 1 g in sodium chloride 0.9 % 100 mL IVPB  1 g Intravenous Q12H Patel, Vishal R, MD      . nicotine (NICODERM CQ - dosed in mg/24 hours) patch 21 mg  21 mg Transdermal Daily Patel, Vishal R, MD      . ondansetron  (ZOFRAN) tablet 4 mg  4 mg Oral Q6H PRN Charlsie Quest, MD       Or  . ondansetron (ZOFRAN) injection 4 mg  4 mg Intravenous Q6H PRN Charlsie Quest, MD      . Melene Muller ON 08/17/2020] pravastatin (PRAVACHOL) tablet 20 mg  20 mg Oral q1800 Darreld Mclean R, MD      . senna-docusate (Senokot-S) tablet 1 tablet  1 tablet Oral QHS PRN Darreld Mclean R, MD      . vancomycin (VANCOCIN) IVPB 1000 mg/200 mL premix  1,000 mg Intravenous Once Katha Cabal, RPH      . vancomycin (VANCOCIN) IVPB 1000 mg/200 mL premix  1,000 mg Intravenous Once Katha Cabal, [facility]       Current Outpatient Medications  Medication Sig Dispense Refill  . allopurinol (ZYLOPRIM) 100 MG tablet Take 100 mg by mouth daily.     Marland Kitchen amLODipine (NORVASC) 10 MG tablet Take 10 mg by mouth daily.     Marland Kitchen aspirin EC 81 MG tablet Take 1 tablet (81 mg total) by mouth daily. Swallow whole. 150 tablet 2  . clopidogrel (PLAVIX) 75 MG tablet Take 1 tablet (75 mg total) by mouth daily. 30 tablet 4  . indomethacin (  INDOCIN) 50 MG capsule Take 50 mg by mouth daily.    . insulin glargine (LANTUS) 100 UNIT/ML injection Inject 50 Units into the skin daily.     Marland Kitchen lovastatin (MEVACOR) 20 MG tablet Take 20 mg by mouth at bedtime.      Past Medical History:  Diagnosis Date  . Arthritis   . CKD (chronic kidney disease), stage III (HCC)   . Diabetes mellitus without complication (HCC)   . Hyperlipidemia   . Hypertension     Past Surgical History:  Procedure Laterality Date  . LOWER EXTREMITY ANGIOGRAPHY Left 08/05/2020   Procedure: LOWER EXTREMITY ANGIOGRAPHY;  Surgeon: Renford Dills, MD;  Location: [facility] INVASIVE [facility];  Service: Cardiovascular;  Laterality: Left;    Social History Social History   Tobacco Use  . Smoking status: Current Every Day Smoker    Packs/day: 1.00    Types: Cigarettes  Vaping Use  . Vaping Use: Never used    Family History Family History  Problem Relation Age of Onset  . Heart disease Mother   . COPD  Father   . Hyperlipidemia Father     No Known Allergies   REVIEW OF SYSTEMS (Negative unless checked)  Constitutional: [] Weight loss  [] Fever  [] Chills Cardiac: [] Chest pain   [] Chest pressure   [] Palpitations   [] Shortness of breath when laying flat   [] Shortness of breath at rest   [] Shortness of breath with exertion. Vascular:  [] Pain in legs with walking   [] Pain in legs at rest   [] Pain in legs when laying flat   [] Claudication   [x] Pain in feet when walking  [x] Pain in feet at rest  [] Pain in feet when laying flat   [] History of DVT   [] Phlebitis   [] Swelling in legs   [] Varicose veins   [] Non-healing ulcers Pulmonary:   [] Uses home oxygen   [] Productive cough   [] Hemoptysis   [] Wheeze  [] COPD   [] Asthma Neurologic:  [] Dizziness  [] Blackouts   [] Seizures   [] History of stroke   [] History of TIA  [] Aphasia   [] Temporary blindness   [] Dysphagia   [] Weakness or numbness in arms   [] Weakness or numbness in legs Musculoskeletal:  [] Arthritis   [] Joint swelling   [] Joint pain   [] Low back pain Hematologic:  [] Easy bruising  [] Easy bleeding   [] Hypercoagulable state   [] Anemic  [] Hepatitis Gastrointestinal:  [] Blood in stool   [] Vomiting blood  [] Gastroesophageal reflux/heartburn   [] Difficulty swallowing. Genitourinary:  [] Chronic kidney disease   [] Difficult urination  [] Frequent urination  [] Burning with urination   [] Blood in urine Skin:  [] Rashes   [] Ulcers   [] Wounds Psychological:  [] History of anxiety   []  History of major depression.  Physical Examination  Vitals:   08/16/20 1716 08/16/20 1718  BP: 129/73   Pulse: 85   Resp: 18   Temp: 97.7 F (36.5 C)   TempSrc: Oral   SpO2: 97%   Weight:  86.2 kg  Height:  6' (1.829 m)   Body mass index is 25.77 kg/m. Gen:  WD/WN, NAD Neck: Trachea midline.  No JVD.  Pulmonary:  Good air movement, respirations not labored, equal bilaterally.  Cardiac: RRR, normal S1, S2. Vascular:  Vessel Right Left  Radial Palpable Palpable                   Femoral Palpable Palpable      PT Palpable Doppler  DP Palpable Doppler   Gastrointestinal: soft, non-tender/non-distended. No guarding/reflex.  Musculoskeletal: M/S 5/5 throughout.  LEFT lower extremity; foot warm, +motor/+sensory, dry gangrene of 4th/5th toes with cellulitis and progressive gangrene of foot Neurologic: Sensation grossly intact in extremities.  Symmetrical.  Speech is fluent. Motor exam as listed above. Psychiatric: Judgment intact, Mood & affect appropriate for pt's clinical situation.       CBC Lab Results  Component Value Date   WBC 13.0 (H) 08/16/2020   HGB 12.9 (L) 08/16/2020   HCT 40.2 08/16/2020   MCV 80.1 08/16/2020   PLT 445 (H) 08/16/2020    BMET    Component Value Date/Time   NA 137 08/16/2020 1721   K 3.0 (L) 08/16/2020 1721   CL 99 08/16/2020 1721   CO2 24 08/16/2020 1721   GLUCOSE 81 08/16/2020 1721   BUN 31 (H) 08/16/2020 1721   CREATININE 1.81 (H) 08/16/2020 1721   CALCIUM 9.1 08/16/2020 1721   GFRNONAA 40 (L) 08/16/2020 1721   Estimated Creatinine Clearance: 43.5 mL/min (A) (by C-G formula based on SCr of 1.81 mg/dL (H)).  COAG No results found for: INR, PROTIME  Radiology PERIPHERAL VASCULAR CATHETERIZATION  Result Date: 08/05/2020 See Op note  DG Chest Port 1 View  Result Date: 08/16/2020 CLINICAL DATA:  Questionable sepsis. Wound infection. Hx of DM, HTN, current smoker. EXAM: PORTABLE CHEST 1 VIEW COMPARISON:  None. FINDINGS: The cardiomediastinal contours are within normal limits. Aortic arch calcification. The lungs are clear. No pneumothorax or large pleural effusion. No acute finding in the visualized skeleton. IMPRESSION: No evidence of active disease. Electronically Signed   By: Emmaline Kluver M.D.   On: 08/16/2020 18:56   DG Foot Complete Left  Result Date: 08/16/2020 CLINICAL DATA:  Infection to left fourth and fifth toes for 1 week. Diabetes. EXAM: LEFT FOOT - COMPLETE 3+ VIEW COMPARISON:   Radiograph 06/17/2020 FINDINGS: Atrophic soft tissues of the fourth and fifth digits with patchy subcutaneous gas. Soft tissue air is also seen tracking proximally adjacent to the distal fifth metatarsal head. Soft tissue gas obscures detailed bony assessment, there is no gross from bony destruction. No radiopaque foreign body. Unchanged appearance of great toe proximal phalanx fracture with well-defined margins and erosion involving the medial aspect. The bones are diffusely under mineralized. No acute fracture. Plantar calcaneal spur and Achilles tendon enthesophyte. There are vascular calcifications. IMPRESSION: 1. Atrophic soft tissues of the fourth and fifth digits with patchy soft tissue air which tracks proximally adjacent to the distal fifth metatarsal head. Findings consistent with gas gangrene. No gross radiographic findings of osteomyelitis. 2. Unchanged appearance of the chronic great toe proximal phalanx fracture. Electronically Signed   By: Narda Rutherford M.D.   On: 08/16/2020 18:12      Assessment/Plan 1. Dry Gangrene of LEFT 4th/5Th toes; Cellulitis and progressive gangrene of foot; No acute ischemia 2. Patient has biphasic DP by bedside doppler indicating some degree of patency s/p angioplasty, however, will plan for angiogram early next week per Dr. Gilda Crease to reevaluate. 3. Podiatry consult for foot gangrene 4. ABX, Blood glucose control    Luka Stohr, Morton Peters, MD  08/16/2020 7:43 PM    This note was created with Dragon medical transcription system.  Any error is purely unintentional

## 2020-08-16 NOTE — Consult Note (Signed)
[facility] Vascular Consult Note  MRN : [id number]  William Melton is a 69 y.o. (1951/08/08) male who presents with chief complaint of  Chief Complaint  Patient presents with  . Wound Infection  .  History of Present Illness: Patient known to service- DM, CKD III, HLD, HTN, continued tobacco use s/p LEFT DP angioplasty on 11/30 for limb salvage, gangrene of LEFT 4th/5th toes. Since that time the patient developed worsening pain of the toes with scant drainage, denied purulent drainage but noted an odor. Denied claudication or rest pain in the forefoot. Denied fever, chills, shortness or breath or cough. Presented today for worsening toe pain and the encouragement of his family.   Current Facility-Administered Medications  Medication Dose Route Frequency Provider Last Rate Last Admin  . acetaminophen (TYLENOL) tablet 650 mg  650 mg Oral Q6H PRN Charlsie Quest, MD       Or  . acetaminophen (TYLENOL) suppository 650 mg  650 mg Rectal Q6H PRN Charlsie Quest, MD      . Melene Muller ON 08/17/2020] amLODipine (NORVASC) tablet 10 mg  10 mg Oral Daily Darreld Mclean R, MD      . clindamycin (CLEOCIN) IVPB 600 mg  600 mg Intravenous Q8H Patel, Vishal R, MD      . HYDROcodone-acetaminophen (NORCO/VICODIN) 5-325 MG per tablet 1-2 tablet  1-2 tablet Oral Q4H PRN Darreld Mclean R, MD      . HYDROmorphone (DILAUDID) injection 0.5 mg  0.5 mg Intravenous Q4H PRN Darreld Mclean R, MD      . insulin aspart (novoLOG) injection 0-9 Units  0-9 Units Subcutaneous Q4H Patel, Vishal R, MD      . insulin glargine (LANTUS) injection 20 Units  20 Units Subcutaneous QHS Patel, Vishal R, MD      . lactated ringers infusion   Intravenous Continuous Patel, Vishal R, MD      . meropenem (MERREM) 1 g in sodium chloride 0.9 % 100 mL IVPB  1 g Intravenous Q12H Patel, Vishal R, MD      . nicotine (NICODERM CQ - dosed in mg/24 hours) patch 21 mg  21 mg Transdermal Daily Patel, Vishal R, MD      . ondansetron  (ZOFRAN) tablet 4 mg  4 mg Oral Q6H PRN Charlsie Quest, MD       Or  . ondansetron (ZOFRAN) injection 4 mg  4 mg Intravenous Q6H PRN Charlsie Quest, MD      . Melene Muller ON 08/17/2020] pravastatin (PRAVACHOL) tablet 20 mg  20 mg Oral q1800 Darreld Mclean R, MD      . senna-docusate (Senokot-S) tablet 1 tablet  1 tablet Oral QHS PRN Darreld Mclean R, MD      . vancomycin (VANCOCIN) IVPB 1000 mg/200 mL premix  1,000 mg Intravenous Once Katha Cabal, RPH      . vancomycin (VANCOCIN) IVPB 1000 mg/200 mL premix  1,000 mg Intravenous Once Katha Cabal, [facility]       Current Outpatient Medications  Medication Sig Dispense Refill  . allopurinol (ZYLOPRIM) 100 MG tablet Take 100 mg by mouth daily.     Marland Kitchen amLODipine (NORVASC) 10 MG tablet Take 10 mg by mouth daily.     Marland Kitchen aspirin EC 81 MG tablet Take 1 tablet (81 mg total) by mouth daily. Swallow whole. 150 tablet 2  . clopidogrel (PLAVIX) 75 MG tablet Take 1 tablet (75 mg total) by mouth daily. 30 tablet 4  . indomethacin (  INDOCIN) 50 MG capsule Take 50 mg by mouth daily.    . insulin glargine (LANTUS) 100 UNIT/ML injection Inject 50 Units into the skin daily.     Marland Kitchen lovastatin (MEVACOR) 20 MG tablet Take 20 mg by mouth at bedtime.      Past Medical History:  Diagnosis Date  . Arthritis   . CKD (chronic kidney disease), stage III (HCC)   . Diabetes mellitus without complication (HCC)   . Hyperlipidemia   . Hypertension     Past Surgical History:  Procedure Laterality Date  . LOWER EXTREMITY ANGIOGRAPHY Left 08/05/2020   Procedure: LOWER EXTREMITY ANGIOGRAPHY;  Surgeon: Renford Dills, MD;  Location: [facility] INVASIVE CV LAB;  Service: Cardiovascular;  Laterality: Left;    Social History Social History   Tobacco Use  . Smoking status: Current Every Day Smoker    Packs/day: 1.00    Types: Cigarettes  Vaping Use  . Vaping Use: Never used    Family History Family History  Problem Relation Age of Onset  . Heart disease Mother   . COPD  Father   . Hyperlipidemia Father     No Known Allergies   REVIEW OF SYSTEMS (Negative unless checked)  Constitutional: [] Weight loss  [] Fever  [] Chills Cardiac: [] Chest pain   [] Chest pressure   [] Palpitations   [] Shortness of breath when laying flat   [] Shortness of breath at rest   [] Shortness of breath with exertion. Vascular:  [] Pain in legs with walking   [] Pain in legs at rest   [] Pain in legs when laying flat   [] Claudication   [x] Pain in feet when walking  [x] Pain in feet at rest  [] Pain in feet when laying flat   [] History of DVT   [] Phlebitis   [] Swelling in legs   [] Varicose veins   [] Non-healing ulcers Pulmonary:   [] Uses home oxygen   [] Productive cough   [] Hemoptysis   [] Wheeze  [] COPD   [] Asthma Neurologic:  [] Dizziness  [] Blackouts   [] Seizures   [] History of stroke   [] History of TIA  [] Aphasia   [] Temporary blindness   [] Dysphagia   [] Weakness or numbness in arms   [] Weakness or numbness in legs Musculoskeletal:  [] Arthritis   [] Joint swelling   [] Joint pain   [] Low back pain Hematologic:  [] Easy bruising  [] Easy bleeding   [] Hypercoagulable state   [] Anemic  [] Hepatitis Gastrointestinal:  [] Blood in stool   [] Vomiting blood  [] Gastroesophageal reflux/heartburn   [] Difficulty swallowing. Genitourinary:  [] Chronic kidney disease   [] Difficult urination  [] Frequent urination  [] Burning with urination   [] Blood in urine Skin:  [] Rashes   [] Ulcers   [] Wounds Psychological:  [] History of anxiety   []  History of major depression.  Physical Examination  Vitals:   08/16/20 1716 08/16/20 1718  BP: 129/73   Pulse: 85   Resp: 18   Temp: 97.7 F (36.5 C)   TempSrc: Oral   SpO2: 97%   Weight:  86.2 kg  Height:  6' (1.829 m)   Body mass index is 25.77 kg/m. Gen:  WD/WN, NAD Neck: Trachea midline.  No JVD.  Pulmonary:  Good air movement, respirations not labored, equal bilaterally.  Cardiac: RRR, normal S1, S2. Vascular:  Vessel Right Left  Radial Palpable Palpable                   Femoral Palpable Palpable      PT Palpable Doppler  DP Palpable Doppler   Gastrointestinal: soft, non-tender/non-distended. No guarding/reflex.  Musculoskeletal: M/S 5/5 throughout.  LEFT lower extremity; foot warm, +motor/+sensory, dry gangrene of 4th/5th toes with cellulitis and progressive gangrene of foot Neurologic: Sensation grossly intact in extremities.  Symmetrical.  Speech is fluent. Motor exam as listed above. Psychiatric: Judgment intact, Mood & affect appropriate for pt's clinical situation.       CBC Lab Results  Component Value Date   WBC 13.0 (H) 08/16/2020   HGB 12.9 (L) 08/16/2020   HCT 40.2 08/16/2020   MCV 80.1 08/16/2020   PLT 445 (H) 08/16/2020    BMET    Component Value Date/Time   NA 137 08/16/2020 1721   K 3.0 (L) 08/16/2020 1721   CL 99 08/16/2020 1721   CO2 24 08/16/2020 1721   GLUCOSE 81 08/16/2020 1721   BUN 31 (H) 08/16/2020 1721   CREATININE 1.81 (H) 08/16/2020 1721   CALCIUM 9.1 08/16/2020 1721   GFRNONAA 40 (L) 08/16/2020 1721   Estimated Creatinine Clearance: 43.5 mL/min (A) (by C-G formula based on SCr of 1.81 mg/dL (H)).  COAG No results found for: INR, PROTIME  Radiology PERIPHERAL VASCULAR CATHETERIZATION  Result Date: 08/05/2020 See Op note  DG Chest Port 1 View  Result Date: 08/16/2020 CLINICAL DATA:  Questionable sepsis. Wound infection. Hx of DM, HTN, current smoker. EXAM: PORTABLE CHEST 1 VIEW COMPARISON:  None. FINDINGS: The cardiomediastinal contours are within normal limits. Aortic arch calcification. The lungs are clear. No pneumothorax or large pleural effusion. No acute finding in the visualized skeleton. IMPRESSION: No evidence of active disease. Electronically Signed   By: Emmaline Kluver M.D.   On: 08/16/2020 18:56   DG Foot Complete Left  Result Date: 08/16/2020 CLINICAL DATA:  Infection to left fourth and fifth toes for 1 week. Diabetes. EXAM: LEFT FOOT - COMPLETE 3+ VIEW COMPARISON:   Radiograph 06/17/2020 FINDINGS: Atrophic soft tissues of the fourth and fifth digits with patchy subcutaneous gas. Soft tissue air is also seen tracking proximally adjacent to the distal fifth metatarsal head. Soft tissue gas obscures detailed bony assessment, there is no gross from bony destruction. No radiopaque foreign body. Unchanged appearance of great toe proximal phalanx fracture with well-defined margins and erosion involving the medial aspect. The bones are diffusely under mineralized. No acute fracture. Plantar calcaneal spur and Achilles tendon enthesophyte. There are vascular calcifications. IMPRESSION: 1. Atrophic soft tissues of the fourth and fifth digits with patchy soft tissue air which tracks proximally adjacent to the distal fifth metatarsal head. Findings consistent with gas gangrene. No gross radiographic findings of osteomyelitis. 2. Unchanged appearance of the chronic great toe proximal phalanx fracture. Electronically Signed   By: Narda Rutherford M.D.   On: 08/16/2020 18:12      Assessment/Plan 1. Dry Gangrene of LEFT 4th/5Th toes; Cellulitis and progressive gangrene of foot; No acute ischemia 2. Patient has biphasic DP by bedside doppler indicating some degree of patency s/p angioplasty, however, will plan for angiogram early next week per Dr. Gilda Crease to reevaluate. 3. Podiatry consult for foot gangrene 4. ABX, Blood glucose control    Najma Bozarth, Morton Peters, MD  08/16/2020 7:43 PM    This note was created with Dragon medical transcription system.  Any error is purely unintentional

## 2020-08-16 NOTE — ED Notes (Signed)
[facility] to provide transportation.

## 2020-08-17 ENCOUNTER — Encounter: Payer: Self-pay | Admitting: Internal Medicine

## 2020-08-17 DIAGNOSIS — E1122 Type 2 diabetes mellitus with diabetic chronic kidney disease: Secondary | ICD-10-CM

## 2020-08-17 DIAGNOSIS — N183 Chronic kidney disease, stage 3 unspecified: Secondary | ICD-10-CM

## 2020-08-17 DIAGNOSIS — I739 Peripheral vascular disease, unspecified: Secondary | ICD-10-CM

## 2020-08-17 DIAGNOSIS — I10 Essential (primary) hypertension: Secondary | ICD-10-CM

## 2020-08-17 DIAGNOSIS — E785 Hyperlipidemia, unspecified: Secondary | ICD-10-CM

## 2020-08-17 DIAGNOSIS — Z720 Tobacco use: Secondary | ICD-10-CM

## 2020-08-17 LAB — SURGICAL PCR SCREEN
MRSA, PCR: NEGATIVE
Staphylococcus aureus: NEGATIVE

## 2020-08-17 LAB — C-REACTIVE PROTEIN: CRP: 16.1 mg/dL — ABNORMAL HIGH (ref <1.0)

## 2020-08-17 LAB — CBC
HCT: 37.2 % — ABNORMAL LOW (ref 39.0–52.0)
Hemoglobin: 11.9 g/dL — ABNORMAL LOW (ref 13.0–17.0)
MCH: 25.8 pg — ABNORMAL LOW (ref 26.0–34.0)
MCHC: 32 g/dL (ref 30.0–36.0)
MCV: 80.5 fL (ref 80.0–100.0)
Platelets: 393 10*3/uL (ref 150–400)
RBC: 4.62 MIL/uL (ref 4.22–5.81)
RDW: 14.5 % (ref 11.5–15.5)
WBC: 12.9 10*3/uL — ABNORMAL HIGH (ref 4.0–10.5)
nRBC: 0 % (ref 0.0–0.2)

## 2020-08-17 LAB — GLUCOSE, CAPILLARY
Glucose-Capillary: 85 mg/dL (ref 70–99)
Glucose-Capillary: 141 mg/dL — ABNORMAL HIGH (ref 70–99)
Glucose-Capillary: 165 mg/dL — ABNORMAL HIGH (ref 70–99)
Glucose-Capillary: 158 mg/dL — ABNORMAL HIGH (ref 70–99)

## 2020-08-17 LAB — HEMOGLOBIN A1C
Hgb A1c MFr Bld: 8.7 % — ABNORMAL HIGH (ref 4.8–5.6)
Mean Plasma Glucose: 202.99 mg/dL

## 2020-08-17 LAB — HIV ANTIBODY (ROUTINE TESTING W REFLEX)
HIV Screen 4th Generation wRfx: NONREACTIVE
HIV Screen 4th Generation wRfx: NONREACTIVE

## 2020-08-17 LAB — BASIC METABOLIC PANEL
Anion gap: 12 (ref 5–15)
BUN: 32 mg/dL — ABNORMAL HIGH (ref 8–23)
CO2: 25 mmol/L (ref 22–32)
Calcium: 8.6 mg/dL — ABNORMAL LOW (ref 8.9–10.3)
Chloride: 99 mmol/L (ref 98–111)
Creatinine, Ser: 1.8 mg/dL — ABNORMAL HIGH (ref 0.61–1.24)
GFR, Estimated: 41 mL/min — ABNORMAL LOW (ref >60)
Glucose, Bld: 98 mg/dL (ref 70–99)
Potassium: 3.4 mmol/L — ABNORMAL LOW (ref 3.5–5.1)
Sodium: 136 mmol/L (ref 135–145)

## 2020-08-17 LAB — CBG MONITORING, ED: Glucose-Capillary: 102 mg/dL — ABNORMAL HIGH (ref 70–99)

## 2020-08-17 MED ORDER — POVIDONE-IODINE 7.5 % EX SOLN
Freq: Once | CUTANEOUS | Status: AC
  Filled 2020-08-17: qty 118

## 2020-08-17 MED ORDER — SODIUM CHLORIDE 0.9 % IV SOLN: INTRAVENOUS | Status: DC

## 2020-08-17 NOTE — Progress Notes (Signed)
[name], male   DOB: 11-23-1950, 69 y.o.   MRN: [id number] [facility] PROGRESS NOTE  NIGIL BRAMAN VQM:[id number] DOB: 17-Aug-1951 DOA: 08/16/2020 PCP: Altamease Oiler, FNP  HPI/Subjective: Patient had a recent angiogram. He states his toes of been black starting a few days after angiogram. Patient does have some pain. He was asking if he can eat this morning.  Objective: Vitals:   08/17/20 0116 08/17/20 0411  BP: (!) 153/78 (!) 170/78  Pulse: 64 64  Resp: 16 16  Temp: 97.8 F (36.6 C) 97.7 F (36.5 C)  SpO2: 100% 100%    Intake/Output Summary (Last 24 hours) at 08/17/2020 1219 Last data filed at 08/17/2020 0734 Gross per 24 hour  Intake 435.16 ml  Output 800 ml  Net -364.84 ml   Filed Weights   08/16/20 1718  Weight: 86.2 kg    ROS: Review of Systems  Respiratory: Negative for shortness of breath.   Cardiovascular: Negative for chest pain.  Gastrointestinal: Negative for abdominal pain, nausea and vomiting.  Musculoskeletal: Positive for joint pain.   Exam: Physical Exam HENT:     Head: Normocephalic.     Mouth/Throat:     Pharynx: No oropharyngeal exudate.  Eyes:     General: Lids are normal.     Conjunctiva/sclera: Conjunctivae normal.     Pupils: Pupils are equal, round, and reactive to light.  Cardiovascular:     Rate and Rhythm: Normal rate and regular rhythm.     Heart sounds: S1 normal and S2 normal. Murmur heard.   Systolic murmur is present with a grade of 3/6.   Pulmonary:     Effort: Pulmonary effort is normal.     Breath sounds: No decreased breath sounds, wheezing, rhonchi or rales.  Abdominal:     Palpations: Abdomen is soft.     Tenderness: There is no abdominal tenderness.  Musculoskeletal:     Right lower leg: No swelling.     Left lower leg: Swelling present.  Skin:    General: Skin is warm.     Comments: Left fourth and fifth toes gangrenous. Top and bottom of the foot dusky appearance.  Neurological:      Mental Status: He is alert and oriented to person, place, and time.       Data Reviewed: Basic Metabolic Panel: Recent Labs  Lab 08/16/20 1721 08/17/20 0312  NA 137 136  K 3.0* 3.4*  CL 99 99  CO2 24 25  GLUCOSE 81 98  BUN 31* 32*  CREATININE 1.81* 1.80*  CALCIUM 9.1 8.6*   Liver Function Tests: Recent Labs  Lab 08/16/20 1721  AST 16  ALT 11  ALKPHOS 72  BILITOT 1.3*  PROT 7.8  ALBUMIN 2.5*   CBC: Recent Labs  Lab 08/16/20 1721 08/17/20 0312  WBC 13.0* 12.9*  HGB 12.9* 11.9*  HCT 40.2 37.2*  MCV 80.1 80.5  PLT 445* 393    CBG: Recent Labs  Lab 08/17/20 0034 08/17/20 0900  GLUCAP 102* 85    Recent Results (from the past 240 hour(s))  Blood culture (routine x 2)     Status: None (Preliminary result)   Collection Time: 08/16/20  7:40 PM   Specimen: BLOOD RIGHT HAND  Result Value Ref Range Status   Specimen Description BLOOD RIGHT HAND  Final   Special Requests   Final    BOTTLES DRAWN AEROBIC AND ANAEROBIC Blood Culture adequate volume   Culture   Final  NO GROWTH < 12 HOURS Performed at [facility], [address] [place], [place] [address]    Report Status PENDING  Incomplete  Blood culture (routine x 2)     Status: None (Preliminary result)   Collection Time: 08/16/20  7:40 PM   Specimen: Left Antecubital; Blood  Result Value Ref Range Status   Specimen Description LEFT ANTECUBITAL  Final   Special Requests   Final    BOTTLES DRAWN AEROBIC ONLY Blood Culture adequate volume   Culture   Final    NO GROWTH < 12 HOURS Performed at [facility], [address] [place], [place] [address]    Report Status PENDING  Incomplete  Resp Panel by RT-PCR (Flu A&B, Covid) Nasopharyngeal Swab     Status: None   Collection Time: 08/16/20  7:40 PM   Specimen: Nasopharyngeal Swab; Nasopharyngeal(NP) swabs in vial transport medium  Result Value Ref Range Status   SARS Coronavirus 2 by RT PCR NEGATIVE NEGATIVE Final    Comment:  (NOTE) SARS-CoV-2 target nucleic acids are NOT DETECTED.  The SARS-CoV-2 RNA is generally detectable in upper respiratory specimens during the acute phase of infection. The lowest concentration of SARS-CoV-2 viral copies this assay can detect is 138 copies/mL. A negative result does not preclude SARS-Cov-2 infection and should not be used as the sole basis for treatment or other patient management decisions. A negative result may occur with  improper specimen collection/handling, submission of specimen other than nasopharyngeal swab, presence of viral mutation(s) within the areas targeted by this assay, and inadequate number of viral copies(<138 copies/mL). A negative result must be combined with clinical observations, patient history, and epidemiological information. The expected result is Negative.  Fact Sheet for Patients:  BloggerCourse.comhttps://www.fda.gov/media/152166/download  Fact Sheet for Healthcare Providers:  SeriousBroker.ithttps://www.fda.gov/media/152162/download  This test is no t yet approved or cleared by the [place] FDA and  has been authorized for detection and/or diagnosis of SARS-CoV-2 by FDA under an Emergency Use Authorization (EUA). This EUA will remain  in effect (meaning this test can be used) for the duration of the COVID-19 declaration under Section 564(b)(1) of the Act, 21 U.S.C.section 360bbb-3(b)(1), unless the authorization is terminated  or revoked sooner.       Influenza A by PCR NEGATIVE NEGATIVE Final   Influenza B by PCR NEGATIVE NEGATIVE Final    Comment: (NOTE) The Xpert Xpress SARS-CoV-2/FLU/RSV plus assay is intended as an aid in the diagnosis of influenza from Nasopharyngeal swab specimens and should not be used as a sole basis for treatment. Nasal washings and aspirates are unacceptable for Xpert Xpress SARS-CoV-2/FLU/RSV testing.  Fact Sheet for Patients: BloggerCourse.comhttps://www.fda.gov/media/152166/download  Fact Sheet for Healthcare  Providers: SeriousBroker.ithttps://www.fda.gov/media/152162/download  This test is not yet approved or cleared by the [place] FDA and has been authorized for detection and/or diagnosis of SARS-CoV-2 by FDA under an Emergency Use Authorization (EUA). This EUA will remain in effect (meaning this test can be used) for the duration of the COVID-19 declaration under Section 564(b)(1) of the Act, 21 U.S.C. section 360bbb-3(b)(1), unless the authorization is terminated or revoked.  Performed at [facility], [address] [place], [place] [address]      [facility] [place] [facility]  Result Date: 08/16/2020 CLINICAL DATA:  Questionable sepsis. Wound infection. Hx of DM, HTN, current smoker. EXAM: PORTABLE CHEST 1 VIEW COMPARISON:  None. FINDINGS: The cardiomediastinal contours are within normal limits. Aortic arch calcification. The lungs are clear. No pneumothorax or large pleural  effusion. No acute finding in the visualized skeleton. IMPRESSION: No evidence of active disease. Electronically Signed   By: Emmaline Kluver M.D.   On: 08/16/2020 18:56   DG Foot Complete Left  Result Date: 08/16/2020 CLINICAL DATA:  Infection to left fourth and fifth toes for 1 week. Diabetes. EXAM: LEFT FOOT - COMPLETE 3+ VIEW COMPARISON:  Radiograph 06/17/2020 FINDINGS: Atrophic soft tissues of the fourth and fifth digits with patchy subcutaneous gas. Soft tissue air is also seen tracking proximally adjacent to the distal fifth metatarsal head. Soft tissue gas obscures detailed bony assessment, there is no gross from bony destruction. No radiopaque foreign body. Unchanged appearance of great toe proximal phalanx fracture with well-defined margins and erosion involving the medial aspect. The bones are diffusely under mineralized. No acute fracture. Plantar calcaneal spur and Achilles tendon enthesophyte. There are vascular calcifications. IMPRESSION: 1. Atrophic soft tissues of the fourth and fifth digits with  patchy soft tissue air which tracks proximally adjacent to the distal fifth metatarsal head. Findings consistent with gas gangrene. No gross radiographic findings of osteomyelitis. 2. Unchanged appearance of the chronic great toe proximal phalanx fracture. Electronically Signed   By: Narda Rutherford M.D.   On: 08/16/2020 18:12    Scheduled Meds: . amLODipine  10 mg Oral Daily  . insulin aspart  0-9 Units Subcutaneous TID WC  . insulin glargine  20 Units Subcutaneous QHS  . nicotine  21 mg Transdermal Daily  . pravastatin  20 mg Oral q1800   Continuous Infusions: . sodium chloride    . clindamycin (CLEOCIN) IV 600 mg (08/17/20 0538)  . meropenem (MERREM) IV 1 g (08/17/20 0903)  . vancomycin      Assessment/Plan:  1. Peripheral vascular disease with limb threatening ischemia with gangrene of the left fourth and fifth toes which is dry but potential for gas gangrene on the foot. Case discussed with podiatry and they will do amputation tomorrow afternoon. Patient on meropenem and vancomycin and clindamycin. Follow-up cultures. Holding Plavix. Will need angiogram prior to disposition also. Lactic acidosis and leukocytosis. 2. 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 Creatinine 1.8. Will give gentle IV fluids because patient will likely have an angiogram. Patient on Lantus 20 units nightly plus sliding scale. Hemoglobin A1c 8.7. 3. Essential hypertension on Norvasc 4. Hyperlipidemia unspecified on pravastatin 5. Tobacco abuse on nicotine patch     Code Status:     Code Status Orders  (From admission, onward)         Start     Ordered   08/16/20 1927  Full code  Continuous        08/16/20 1928        Code Status History    Date Active Date Inactive Code Status Order ID Comments User Context   08/05/2020 0945 08/05/2020 1627 Full Code [id number]  Schnier, Latina Craver, MD Inpatient   Advance Care Planning Activity     Family Communication: Spoke with the  patient's son on the phone Disposition Plan: Status is: Inpatient  Dispo: The patient is from: Home              Anticipated d/c is to: Home              Anticipated d/c date is: Potentially near the end of the week 12/16 or 12/17.              Patient currently will need amputation to make sure healing. Will need treatment of  infection. Will also need an angiogram.  Consultants:  Podiatry  Antibiotics:  Meropenem  Vancomycin  Clindamycin  Time spent: 27 minutes, case discussed with podiatry and vascular surgery  Emelin Dascenzo [facility]  [facility]

## 2020-08-17 NOTE — Plan of Care (Signed)
Continuing with plan of care. 

## 2020-08-17 NOTE — H&P (View-Only) (Signed)
 Subjective/Chief Complaint: Continued pain in LEFT foot. Unchanged from yesterday. Otherwise without complaint.   Objective: Vital signs in last 24 hours: Temp:  [97.7 F (36.5 C)-97.8 F (36.6 C)] 97.7 F (36.5 C) (12/12 0411) Pulse Rate:  [64-100] 64 (12/12 0411) Resp:  [12-18] 16 (12/12 0411) BP: (129-187)/(63-98) 170/78 (12/12 0411) SpO2:  [97 %-100 %] 100 % (12/12 0411) Weight:  [86.2 kg] 86.2 kg (12/11 1718) Last BM Date: 08/14/20  Intake/Output from previous day: 12/11 0701 - 12/12 0700 In: 435.2 [I.V.:375.3; IV Piggyback:59.9] Out: 400 [Urine:400] Intake/Output this shift: Total I/O In: -  Out: 400 [Urine:400]  General appearance: alert and no distress Cardio: regular rate and rhythm Extremities: Dry gangrene LEFT 4th/5th toes. foot warm, monophasic DP  Lab Results:  Recent Labs    08/16/20 1721 08/17/20 0312  WBC 13.0* 12.9*  HGB 12.9* 11.9*  HCT 40.2 37.2*  PLT 445* 393   BMET Recent Labs    08/16/20 1721 08/17/20 0312  NA 137 136  K 3.0* 3.4*  CL 99 99  CO2 24 25  GLUCOSE 81 98  BUN 31* 32*  CREATININE 1.81* 1.80*  CALCIUM 9.1 8.6*   PT/INR Recent Labs    08/16/20 2033  LABPROT 14.5  INR 1.2   ABG No results for input(s): PHART, HCO3 in the last 72 hours.  Invalid input(s): PCO2, PO2  Studies/Results: DG Chest Port 1 View  Result Date: 08/16/2020 CLINICAL DATA:  Questionable sepsis. Wound infection. Hx of DM, HTN, current smoker. EXAM: PORTABLE CHEST 1 VIEW COMPARISON:  None. FINDINGS: The cardiomediastinal contours are within normal limits. Aortic arch calcification. The lungs are clear. No pneumothorax or large pleural effusion. No acute finding in the visualized skeleton. IMPRESSION: No evidence of active disease. Electronically Signed   By: Nancy  Ballantyne M.D.   On: 08/16/2020 18:56   DG Foot Complete Left  Result Date: 08/16/2020 CLINICAL DATA:  Infection to left fourth and fifth toes for 1 week. Diabetes. EXAM: LEFT  FOOT - COMPLETE 3+ VIEW COMPARISON:  Radiograph 06/17/2020 FINDINGS: Atrophic soft tissues of the fourth and fifth digits with patchy subcutaneous gas. Soft tissue air is also seen tracking proximally adjacent to the distal fifth metatarsal head. Soft tissue gas obscures detailed bony assessment, there is no gross from bony destruction. No radiopaque foreign body. Unchanged appearance of great toe proximal phalanx fracture with well-defined margins and erosion involving the medial aspect. The bones are diffusely under mineralized. No acute fracture. Plantar calcaneal spur and Achilles tendon enthesophyte. There are vascular calcifications. IMPRESSION: 1. Atrophic soft tissues of the fourth and fifth digits with patchy soft tissue air which tracks proximally adjacent to the distal fifth metatarsal head. Findings consistent with gas gangrene. No gross radiographic findings of osteomyelitis. 2. Unchanged appearance of the chronic great toe proximal phalanx fracture. Electronically Signed   By: Melanie  Sanford M.D.   On: 08/16/2020 18:12    Anti-infectives: Anti-infectives (From admission, onward)   Start     Dose/Rate Route Frequency Ordered Stop   08/17/20 2200  vancomycin (VANCOREADY) IVPB 1250 mg/250 mL        1,250 mg 166.7 mL/hr over 90 Minutes Intravenous Every 24 hours 08/16/20 2231     08/16/20 2200  meropenem (MERREM) 1 g in sodium chloride 0.9 % 100 mL IVPB        1 g 200 mL/hr over 30 Minutes Intravenous Every 12 hours 08/16/20 1921     08/16/20 2200  clindamycin (CLEOCIN) IVPB 600 mg          600 mg 100 mL/hr over 30 Minutes Intravenous Every 8 hours 08/16/20 1921     08/16/20 2100  vancomycin (VANCOCIN) IVPB 1000 mg/200 mL premix        1,000 mg 200 mL/hr over 60 Minutes Intravenous  Once 08/16/20 1923 08/16/20 2250   08/16/20 1900  vancomycin (VANCOCIN) IVPB 1000 mg/200 mL premix        1,000 mg 200 mL/hr over 60 Minutes Intravenous  Once 08/16/20 1834 08/16/20 2050   08/16/20 1845   ceFEPIme (MAXIPIME) 2 g in sodium chloride 0.9 % 100 mL IVPB  Status:  Discontinued        2 g 200 mL/hr over 30 Minutes Intravenous  Once 08/16/20 1834 08/16/20 1921      Assessment/Plan: s/p Procedure(s): PARTIAL AMPUTATION  OF 4TH AND 5THRAY (Left) Appreciate Podiatry input/care . Plan for Ray amputations tomorrow. Plan for Angiogram to assess/improve flow to foot for limb salvage on Tuesday per Dr. Gilda Crease Continue ABX.  LOS: 1 day    Eli Hose A 08/17/2020

## 2020-08-17 NOTE — Progress Notes (Signed)
Subjective/Chief Complaint: Continued pain in LEFT foot. Unchanged from yesterday. Otherwise without complaint.   Objective: Vital signs in last 24 hours: Temp:  [97.7 F (36.5 C)-97.8 F (36.6 C)] 97.7 F (36.5 C) (12/12 0411) Pulse Rate:  [64-100] 64 (12/12 0411) Resp:  [12-18] 16 (12/12 0411) BP: (129-187)/(63-98) 170/78 (12/12 0411) SpO2:  [97 %-100 %] 100 % (12/12 0411) Weight:  [86.2 kg] 86.2 kg (12/11 1718) Last BM Date: 08/14/20  Intake/Output from previous day: 12/11 0701 - 12/12 0700 In: 435.2 [I.V.:375.3; IV Piggyback:59.9] Out: 400 [Urine:400] Intake/Output this shift: Total I/O In: -  Out: 400 [Urine:400]  General appearance: alert and no distress Cardio: regular rate and rhythm Extremities: Dry gangrene LEFT 4th/5th toes. foot warm, monophasic DP  Lab Results:  Recent Labs    08/16/20 1721 08/17/20 0312  WBC 13.0* 12.9*  HGB 12.9* 11.9*  HCT 40.2 37.2*  PLT 445* 393   BMET Recent Labs    08/16/20 1721 08/17/20 0312  NA 137 136  K 3.0* 3.4*  CL 99 99  CO2 24 25  GLUCOSE 81 98  BUN 31* 32*  CREATININE 1.81* 1.80*  CALCIUM 9.1 8.6*   PT/INR Recent Labs    08/16/20 2033  LABPROT 14.5  INR 1.2   ABG No results for input(s): PHART, HCO3 in the last 72 hours.  Invalid input(s): PCO2, PO2  Studies/Results: DG Chest Port 1 View  Result Date: 08/16/2020 CLINICAL DATA:  Questionable sepsis. Wound infection. Hx of DM, HTN, current smoker. EXAM: PORTABLE CHEST 1 VIEW COMPARISON:  None. FINDINGS: The cardiomediastinal contours are within normal limits. Aortic arch calcification. The lungs are clear. No pneumothorax or large pleural effusion. No acute finding in the visualized skeleton. IMPRESSION: No evidence of active disease. Electronically Signed   By: Emmaline Kluver M.D.   On: 08/16/2020 18:56   DG Foot Complete Left  Result Date: 08/16/2020 CLINICAL DATA:  Infection to left fourth and fifth toes for 1 week. Diabetes. EXAM: LEFT  FOOT - COMPLETE 3+ VIEW COMPARISON:  Radiograph 06/17/2020 FINDINGS: Atrophic soft tissues of the fourth and fifth digits with patchy subcutaneous gas. Soft tissue air is also seen tracking proximally adjacent to the distal fifth metatarsal head. Soft tissue gas obscures detailed bony assessment, there is no gross from bony destruction. No radiopaque foreign body. Unchanged appearance of great toe proximal phalanx fracture with well-defined margins and erosion involving the medial aspect. The bones are diffusely under mineralized. No acute fracture. Plantar calcaneal spur and Achilles tendon enthesophyte. There are vascular calcifications. IMPRESSION: 1. Atrophic soft tissues of the fourth and fifth digits with patchy soft tissue air which tracks proximally adjacent to the distal fifth metatarsal head. Findings consistent with gas gangrene. No gross radiographic findings of osteomyelitis. 2. Unchanged appearance of the chronic great toe proximal phalanx fracture. Electronically Signed   By: Narda Rutherford M.D.   On: 08/16/2020 18:12    Anti-infectives: Anti-infectives (From admission, onward)   Start     Dose/Rate Route Frequency Ordered Stop   08/17/20 2200  vancomycin (VANCOREADY) IVPB 1250 mg/250 mL        1,250 mg 166.7 mL/hr over 90 Minutes Intravenous Every 24 hours 08/16/20 2231     08/16/20 2200  meropenem (MERREM) 1 g in sodium chloride 0.9 % 100 mL IVPB        1 g 200 mL/hr over 30 Minutes Intravenous Every 12 hours 08/16/20 1921     08/16/20 2200  clindamycin (CLEOCIN) IVPB 600 mg  600 mg 100 mL/hr over 30 Minutes Intravenous Every 8 hours 08/16/20 1921     08/16/20 2100  vancomycin (VANCOCIN) IVPB 1000 mg/200 mL premix        1,000 mg 200 mL/hr over 60 Minutes Intravenous  Once 08/16/20 1923 08/16/20 2250   08/16/20 1900  vancomycin (VANCOCIN) IVPB 1000 mg/200 mL premix        1,000 mg 200 mL/hr over 60 Minutes Intravenous  Once 08/16/20 1834 08/16/20 2050   08/16/20 1845   ceFEPIme (MAXIPIME) 2 g in sodium chloride 0.9 % 100 mL IVPB  Status:  Discontinued        2 g 200 mL/hr over 30 Minutes Intravenous  Once 08/16/20 1834 08/16/20 1921      Assessment/Plan: s/p Procedure(s): PARTIAL AMPUTATION  OF 4TH AND 5THRAY (Left) Appreciate Podiatry input/care . Plan for Ray amputations tomorrow. Plan for Angiogram to assess/improve flow to foot for limb salvage on Tuesday per Dr. Gilda Crease Continue ABX.  LOS: 1 day    Eli Hose A 08/17/2020

## 2020-08-17 NOTE — ED Notes (Signed)
Report provided to J.Judie Grieve RN. Pt transported by [employer], EDT in bed.

## 2020-08-17 NOTE — Consult Note (Addendum)
PODIATRY / FOOT AND ANKLE SURGERY CONSULTATION NOTE  Requesting Physician: Dr. Renae Gloss  Reason for consult: Gangrene left foot  Chief Complaint: Gangrene left foot, left foot pain   HPI: William Melton is a 69 y.o. male who presents with worsening necrosis and pain to the left forefoot more specifically about the fourth and fifth rays.  Patient recently underwent lower extremity revascularization procedure on 08/05/2020 with Dr. Gilda Crease for potential limb salvage.  Patient had initially been doing okay afterwards but began having increased pain and discomfort as well as necrosis of the fourth and fifth rays of the left foot.  He has noticed no discharge company areas currently.  Patient denies nausea, vomiting, fever, chills.  Patient is a smoker and smokes about 1 pack/day.  Patient came to the hospital due to worsening appearance of the left fourth and fifth toes as well as increased pain.  Vascular surgery has seen patient while in the hospital and is planning further CT angiogram with possible intervention but no drastic plans are in place currently.  Patient does complain of rest pain today to left foot.  PMHx:  Past Medical History:  Diagnosis Date  . Arthritis   . CKD (chronic kidney disease), stage III (HCC)   . Diabetes mellitus without complication (HCC)   . Hyperlipidemia   . Hypertension     Surgical Hx:  Past Surgical History:  Procedure Laterality Date  . LOWER EXTREMITY ANGIOGRAPHY Left 08/05/2020   Procedure: LOWER EXTREMITY ANGIOGRAPHY;  Surgeon: Renford Dills, MD;  Location: [facility] INVASIVE CV LAB;  Service: Cardiovascular;  Laterality: Left;    FHx:  Family History  Problem Relation Age of Onset  . Heart disease Mother   . COPD Father   . Hyperlipidemia Father     Social History:  reports that he has been smoking cigarettes. He has been smoking about 1.00 pack per day. He has never used smokeless tobacco. No history on file for alcohol use and drug  use.  Allergies: No Known Allergies  Review of Systems: General ROS: negative Respiratory ROS: no cough, shortness of breath, or wheezing Cardiovascular ROS: no chest pain or dyspnea on exertion Musculoskeletal ROS: positive for - joint pain Neurological ROS: positive for - numbness/tingling Dermatological ROS: positive for Gangrene left fourth and fifth rays  Medications Prior to Admission  Medication Sig Dispense Refill  . allopurinol (ZYLOPRIM) 100 MG tablet Take 100 mg by mouth daily.     Marland Kitchen amLODipine (NORVASC) 10 MG tablet Take 10 mg by mouth daily.     Marland Kitchen aspirin EC 81 MG tablet Take 1 tablet (81 mg total) by mouth daily. Swallow whole. 150 tablet 2  . clopidogrel (PLAVIX) 75 MG tablet Take 1 tablet (75 mg total) by mouth daily. 30 tablet 4  . indomethacin (INDOCIN) 50 MG capsule Take 50 mg by mouth daily.    . insulin glargine (LANTUS) 100 UNIT/ML injection Inject 50 Units into the skin daily.     Marland Kitchen lovastatin (MEVACOR) 20 MG tablet Take 20 mg by mouth at bedtime.      Physical Exam: General: Alert and oriented.  No apparent distress.  Vascular: DP/PT pulses faintly palpable right, nonpalpable left.  Capillary fill time absent to the left lateral foot at the fourth and fifth toes, plantar fourth and fifth metatarsal phalangeal joints, dorsal fourth and fifth metatarsal phalangeal joints and lateral aspect as well.  No hair growth noted to digits.  Feet do appear to be fairly warm to the  touch though.  Neuro: Light touch sensation mildly reduced to both feet.  Derm: Dry stable gangrenous necrosis seen to the left fourth and fifth rays as seen below and pictures.  Does appear to have nonviable tissue at the dorsal, lateral and, plantar aspects of the fourth and fifth metatarsal phalangeal joints.        MSK: Pain on palpation to the left forefoot and left lower leg.  Results for orders placed or performed during the hospital encounter of 08/16/20 (from the past 48 hour(s))   CBC     Status: Abnormal   Collection Time: 08/16/20  5:21 PM  Result Value Ref Range   WBC 13.0 (H) 4.0 - 10.5 K/uL   RBC 5.02 4.22 - 5.81 MIL/uL   Hemoglobin 12.9 (L) 13.0 - 17.0 g/dL   HCT 16.1 09.6 - 04.5 %   MCV 80.1 80.0 - 100.0 fL   MCH 25.7 (L) 26.0 - 34.0 pg   MCHC 32.1 30.0 - 36.0 g/dL   RDW 40.9 81.1 - 91.4 %   Platelets 445 (H) 150 - 400 K/uL   nRBC 0.0 0.0 - 0.2 %    Comment: Performed at [facility], [address] [place], [place] [address]  Comprehensive metabolic panel     Status: Abnormal   Collection Time: 08/16/20  5:21 PM  Result Value Ref Range   Sodium 137 135 - 145 mmol/L   Potassium 3.0 (L) 3.5 - 5.1 mmol/L   Chloride 99 98 - 111 mmol/L   CO2 24 22 - 32 mmol/L   Glucose, Bld 81 70 - 99 mg/dL    Comment: Glucose reference range applies only to samples taken after fasting for at least 8 hours.   BUN 31 (H) 8 - 23 mg/dL   Creatinine, Ser 6.21 (H) 0.61 - 1.24 mg/dL   Calcium 9.1 8.9 - 30.8 mg/dL   Total Protein 7.8 6.5 - 8.1 g/dL   Albumin 2.5 (L) 3.5 - 5.0 g/dL   AST 16 15 - 41 U/L   ALT 11 0 - 44 U/L   Alkaline Phosphatase 72 38 - 126 U/L   Total Bilirubin 1.3 (H) 0.3 - 1.2 mg/dL   GFR, Estimated 40 (L) >60 mL/min    Comment: (NOTE) Calculated using the CKD-EPI Creatinine Equation (2021)    Anion gap 14 5 - 15    Comment: Performed at [facility], [address] [place], [place] [address]  Sedimentation rate     Status: Abnormal   Collection Time: 08/16/20  5:21 PM  Result Value Ref Range   Sed Rate 44 (H) 0 - 20 mm/hr    Comment: Performed at [facility], [address] [place], [place] [address]  Lactic acid, plasma     Status: None   Collection Time: 08/16/20  7:40 PM  Result Value Ref Range   Lactic Acid, Venous 1.4 0.5 - 1.9 mmol/L    Comment: Performed at [facility], [address] [place], [place] [address]  Blood culture (routine x 2)     Status: None (Preliminary result)   Collection  Time: 08/16/20  7:40 PM   Specimen: BLOOD RIGHT HAND  Result Value Ref Range   Specimen Description BLOOD RIGHT HAND    Special Requests      BOTTLES DRAWN AEROBIC AND ANAEROBIC Blood Culture adequate volume   Culture      NO GROWTH < 12 HOURS Performed at [facility], [address]  [place], [place] [address]    Report Status PENDING   Blood culture (routine x 2)     Status: None (Preliminary result)   Collection Time: 08/16/20  7:40 PM   Specimen: Left Antecubital; Blood  Result Value Ref Range   Specimen Description LEFT ANTECUBITAL    Special Requests      BOTTLES DRAWN AEROBIC ONLY Blood Culture adequate volume   Culture      NO GROWTH < 12 HOURS Performed at [facility], [address], [place], [place] [address]    Report Status PENDING   Resp Panel by RT-PCR (Flu A&B, Covid) Nasopharyngeal Swab     Status: None   Collection Time: 08/16/20  7:40 PM   Specimen: Nasopharyngeal Swab; Nasopharyngeal(NP) swabs in vial transport medium  Result Value Ref Range   SARS Coronavirus 2 by RT PCR NEGATIVE NEGATIVE    Comment: (NOTE) SARS-CoV-2 target nucleic acids are NOT DETECTED.  The SARS-CoV-2 RNA is generally detectable in upper respiratory specimens during the acute phase of infection. The lowest concentration of SARS-CoV-2 viral copies this assay can detect is 138 copies/mL. A negative result does not preclude SARS-Cov-2 infection and should not be used as the sole basis for treatment or other patient management decisions. A negative result may occur with  improper specimen collection/handling, submission of specimen other than nasopharyngeal swab, presence of viral mutation(s) within the areas targeted by this assay, and inadequate number of viral copies(<138 copies/mL). A negative result must be combined with clinical observations, patient history, and epidemiological information. The expected result is Negative.  Fact Sheet for Patients:   BloggerCourse.com  Fact Sheet for Healthcare Providers:  SeriousBroker.it  This test is no t yet approved or cleared by the [place] FDA and  has been authorized for detection and/or diagnosis of SARS-CoV-2 by FDA under an Emergency Use Authorization (EUA). This EUA will remain  in effect (meaning this test can be used) for the duration of the COVID-19 declaration under Section 564(b)(1) of the Act, 21 U.S.C.section 360bbb-3(b)(1), unless the authorization is terminated  or revoked sooner.       Influenza A by PCR NEGATIVE NEGATIVE   Influenza B by PCR NEGATIVE NEGATIVE    Comment: (NOTE) The Xpert Xpress SARS-CoV-2/FLU/RSV plus assay is intended as an aid in the diagnosis of influenza from Nasopharyngeal swab specimens and should not be used as a sole basis for treatment. Nasal washings and aspirates are unacceptable for Xpert Xpress SARS-CoV-2/FLU/RSV testing.  Fact Sheet for Patients: BloggerCourse.com  Fact Sheet for Healthcare Providers: SeriousBroker.it  This test is not yet approved or cleared by the [place] FDA and has been authorized for detection and/or diagnosis of SARS-CoV-2 by FDA under an Emergency Use Authorization (EUA). This EUA will remain in effect (meaning this test can be used) for the duration of the COVID-19 declaration under Section 564(b)(1) of the Act, 21 U.S.C. section 360bbb-3(b)(1), unless the authorization is terminated or revoked.  Performed at [facility], [address] [place], [place] [address]   Lactic acid, plasma     Status: Abnormal   Collection Time: 08/16/20  8:33 PM  Result Value Ref Range   Lactic Acid, Venous 2.0 (HH) 0.5 - 1.9 mmol/L    Comment: CRITICAL RESULT CALLED TO, READ BACK BY AND VERIFIED WITH VANESSA  ASHLEY @2107  ON 08/16/20 SKL Performed at [facility], [address],  [place], [place]   C-reactive protein     Status: Abnormal  Collection Time: 08/16/20  8:33 PM  Result Value Ref Range   CRP 16.1 (H) <1.0 mg/dL    Comment: Performed at [facility], [address] [place], [place] [address]  Protime-INR     Status: None   Collection Time: 08/16/20  8:33 PM  Result Value Ref Range   Prothrombin Time 14.5 11.4 - 15.2 seconds   INR 1.2 0.8 - 1.2    Comment: (NOTE) INR goal varies based on device and disease states. Performed at [facility], [address] [place], [place] [address]   APTT     Status: Abnormal   Collection Time: 08/16/20  8:33 PM  Result Value Ref Range   aPTT 41 (H) 24 - 36 seconds    Comment:        IF BASELINE aPTT IS ELEVATED, SUGGEST PATIENT RISK ASSESSMENT BE USED TO DETERMINE APPROPRIATE ANTICOAGULANT THERAPY. Performed at [facility], [address] [place], [place] [address]   HIV Antibody (routine testing w rflx)     Status: None   Collection Time: 08/16/20  8:33 PM  Result Value Ref Range   HIV Screen 4th Generation wRfx Non Reactive Non Reactive    Comment: Performed at [facility], [address] [place], [place] [address]  CBG monitoring, ED     Status: Abnormal   Collection Time: 08/17/20 12:34 AM  Result Value Ref Range   Glucose-Capillary 102 (H) 70 - 99 mg/dL    Comment: Glucose reference range applies only to samples taken after fasting for at least 8 hours.  CBC     Status: Abnormal   Collection Time: 08/17/20  3:12 AM  Result Value Ref Range   WBC 12.9 (H) 4.0 - 10.5 K/uL   RBC 4.62 4.22 - 5.81 MIL/uL   Hemoglobin 11.9 (L) 13.0 - 17.0 g/dL   HCT 29.537.2 (L) 62.139.0 - 30.852.0 %   MCV 80.5 80.0 - 100.0 fL   MCH 25.8 (L) 26.0 - 34.0 pg   MCHC 32.0 30.0 - 36.0 g/dL   RDW 65.714.5 84.611.5 - 96.215.5 %   Platelets 393 150 - 400 K/uL   nRBC 0.0 0.0 - 0.2 %    Comment: Performed at [facility], [address] [place], [place] [address]  Basic metabolic panel      Status: Abnormal   Collection Time: 08/17/20  3:12 AM  Result Value Ref Range   Sodium 136 135 - 145 mmol/L   Potassium 3.4 (L) 3.5 - 5.1 mmol/L   Chloride 99 98 - 111 mmol/L   CO2 25 22 - 32 mmol/L   Glucose, Bld 98 70 - 99 mg/dL    Comment: Glucose reference range applies only to samples taken after fasting for at least 8 hours.   BUN 32 (H) 8 - 23 mg/dL   Creatinine, Ser 1.321.80 (H) 0.61 - 1.24 mg/dL   Calcium 8.6 (L) 8.9 - 10.3 mg/dL   GFR, Estimated 41 (L) >60 mL/min    Comment: (NOTE) Calculated using the CKD-EPI Creatinine Equation (2021)    Anion gap 12 5 - 15    Comment: Performed at [facility], [address] [place], [place] [address]  Hemoglobin A1c     Status: Abnormal   Collection Time: 08/17/20  3:12 AM  Result Value Ref Range   Hgb A1c MFr Bld 8.7 (H) 4.8 - 5.6 %    Comment: (NOTE) Pre diabetes:  5.7%-6.4%  Diabetes:              >6.4%  Glycemic control for   <7.0% adults with diabetes    Mean Plasma Glucose 202.99 mg/dL    Comment: Performed at [facility], [address], [place], [place] [address]  Glucose, capillary     Status: None   Collection Time: 08/17/20  9:00 AM  Result Value Ref Range   Glucose-Capillary 85 70 - 99 mg/dL    Comment: Glucose reference range applies only to samples taken after fasting for at least 8 hours.   DG Chest Port 1 View  Result Date: 08/16/2020 CLINICAL DATA:  Questionable sepsis. Wound infection. Hx of DM, HTN, current smoker. EXAM: PORTABLE CHEST 1 VIEW COMPARISON:  None. FINDINGS: The cardiomediastinal contours are within normal limits. Aortic arch calcification. The lungs are clear. No pneumothorax or large pleural effusion. No acute finding in the visualized skeleton. IMPRESSION: No evidence of active disease. Electronically Signed   By: Emmaline Kluver M.D.   On: 08/16/2020 18:56   DG Foot Complete Left  Result Date: 08/16/2020 CLINICAL DATA:  Infection to left fourth and fifth toes for  1 week. Diabetes. EXAM: LEFT FOOT - COMPLETE 3+ VIEW COMPARISON:  Radiograph 06/17/2020 FINDINGS: Atrophic soft tissues of the fourth and fifth digits with patchy subcutaneous gas. Soft tissue air is also seen tracking proximally adjacent to the distal fifth metatarsal head. Soft tissue gas obscures detailed bony assessment, there is no gross from bony destruction. No radiopaque foreign body. Unchanged appearance of great toe proximal phalanx fracture with well-defined margins and erosion involving the medial aspect. The bones are diffusely under mineralized. No acute fracture. Plantar calcaneal spur and Achilles tendon enthesophyte. There are vascular calcifications. IMPRESSION: 1. Atrophic soft tissues of the fourth and fifth digits with patchy soft tissue air which tracks proximally adjacent to the distal fifth metatarsal head. Findings consistent with gas gangrene. No gross radiographic findings of osteomyelitis. 2. Unchanged appearance of the chronic great toe proximal phalanx fracture. Electronically Signed   By: Narda Rutherford M.D.   On: 08/16/2020 18:12    Blood pressure (!) 170/78, pulse 64, temperature 97.7 F (36.5 C), temperature source Oral, resp. rate 16, height 6' (1.829 m), weight 86.2 kg, SpO2 100 %.  Assessment 1. 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 PVD 3. Uncontrolled diabetes type 2 polyneuropathy 4. Chronic pain left foot/lower leg likely due to avascular tissue  Plan -Patient seen and examined today. -X-ray imaging reviewed and discussed with patient in detail.  Appears to show necrosis of the fourth and fifth rays.  Believe that the gas type tissue is more so just necrotic tissue that is present visually when looking at the foot, not likely to have gas in the tissues based on clinical appearance and radiographic appearance when seen side-by-side. -Appreciate antibiotic recommendations per medicine. -Concerned that patient has worsening necrosis of  fourth and fifth toes and the plantar skin flap underneath that area also appears to be necrosis.  Discussed long-term implications of this condition with patient in detail. -Discussed all treatment options with patient both conservative and surgical attempts at correction clean potential risks and complications.  At this time patient has elected for surgical procedure consisting of left partial fourth and fifth ray amputations with removal of all nonviable necrotic tissue.  Discussed with patient that he could need a transmetatarsal amputation in the future if tissues are nonhealing and further tissue necrosis ensues.  Discussed with patient that if worsening necrosis does occur then he may even need a more proximal amputation.  Patient would like to try to save as much of the foot as possible.  Patient will likely need wound VAC placement after procedure as the plantar flap does not appear to be viable and concern for flap closure remains. -Appreciate recommendations per vascular surgery.  Patient recently had a revascularization procedure but blood flow still appears to be fairly compromised to the foot.  They are planning for potential angiogram again early next week.  Apparently patient has DP flow but PT flow appears to be absent/severely compromised.  Discussed with Dr. Evie Lacks.  Plan for angio on Tuesday. -Patient to be NPO for surgery at 8 AM tomorrow and surgery will occur sometime around 4:30 PM.  All questions answered.  Rosetta Posner, DPM 08/17/2020, 9:20 AM

## 2020-08-18 ENCOUNTER — Inpatient Hospital Stay: Payer: Medicare HMO | Admitting: Anesthesiology

## 2020-08-18 ENCOUNTER — Encounter
Admission: EM | Disposition: A | Discharge status: Skilled Nursing Facility | Payer: Self-pay | Source: Home / Self Care | Attending: Internal Medicine

## 2020-08-18 ENCOUNTER — Inpatient Hospital Stay: Payer: Medicare HMO

## 2020-08-18 ENCOUNTER — Encounter: Payer: Self-pay | Admitting: Internal Medicine

## 2020-08-18 HISTORY — PX: AMPUTATION: SHX166

## 2020-08-18 LAB — CBC
HCT: 37.6 % — ABNORMAL LOW (ref 39.0–52.0)
Hemoglobin: 11.8 g/dL — ABNORMAL LOW (ref 13.0–17.0)
MCH: 25.3 pg — ABNORMAL LOW (ref 26.0–34.0)
MCHC: 31.4 g/dL (ref 30.0–36.0)
MCV: 80.5 fL (ref 80.0–100.0)
Platelets: 404 10*3/uL — ABNORMAL HIGH (ref 150–400)
RBC: 4.67 MIL/uL (ref 4.22–5.81)
RDW: 14.6 % (ref 11.5–15.5)
WBC: 12.5 10*3/uL — ABNORMAL HIGH (ref 4.0–10.5)
nRBC: 0 % (ref 0.0–0.2)

## 2020-08-18 LAB — GLUCOSE, CAPILLARY
Glucose-Capillary: 126 mg/dL — ABNORMAL HIGH (ref 70–99)
Glucose-Capillary: 91 mg/dL (ref 70–99)
Glucose-Capillary: 91 mg/dL (ref 70–99)
Glucose-Capillary: 100 mg/dL — ABNORMAL HIGH (ref 70–99)
Glucose-Capillary: 170 mg/dL — ABNORMAL HIGH (ref 70–99)

## 2020-08-18 LAB — BASIC METABOLIC PANEL
Anion gap: 12 (ref 5–15)
BUN: 24 mg/dL — ABNORMAL HIGH (ref 8–23)
CO2: 26 mmol/L (ref 22–32)
Calcium: 8.5 mg/dL — ABNORMAL LOW (ref 8.9–10.3)
Chloride: 99 mmol/L (ref 98–111)
Creatinine, Ser: 1.58 mg/dL — ABNORMAL HIGH (ref 0.61–1.24)
GFR, Estimated: 48 mL/min — ABNORMAL LOW (ref >60)
Glucose, Bld: 125 mg/dL — ABNORMAL HIGH (ref 70–99)
Potassium: 3.7 mmol/L (ref 3.5–5.1)
Sodium: 137 mmol/L (ref 135–145)

## 2020-08-18 SURGERY — AMPUTATION, FOOT, RAY
Anesthesia: General | Laterality: Left

## 2020-08-18 MED ORDER — VANCOMYCIN HCL 1000 MG IV SOLR
INTRAVENOUS | Status: DC | PRN
  Administered 2020-08-18: 1000 mg via TOPICAL

## 2020-08-18 MED ORDER — FENTANYL CITRATE (PF) 100 MCG/2ML IJ SOLN: 25.0000 ug | INTRAMUSCULAR | Status: DC | PRN

## 2020-08-18 MED ORDER — PROPOFOL 10 MG/ML IV BOLUS
INTRAVENOUS | Status: AC
  Filled 2020-08-18: qty 20

## 2020-08-18 MED ORDER — PROPOFOL 10 MG/ML IV BOLUS
INTRAVENOUS | Status: DC | PRN
  Administered 2020-08-18: 120 mg via INTRAVENOUS

## 2020-08-18 MED ORDER — BUPIVACAINE HCL 0.5 % IJ SOLN
INTRAMUSCULAR | Status: DC | PRN
  Administered 2020-08-18: 20 mL

## 2020-08-18 MED ORDER — EPHEDRINE SULFATE 50 MG/ML IJ SOLN
INTRAMUSCULAR | Status: DC | PRN
  Administered 2020-08-18: 5 mg via INTRAVENOUS

## 2020-08-18 MED ORDER — CHLORHEXIDINE GLUCONATE 0.12 % MT SOLN
OROMUCOSAL | Status: AC
  Administered 2020-08-18: 17:00:00 15 mL via OROMUCOSAL
  Filled 2020-08-18: qty 15

## 2020-08-18 MED ORDER — LIDOCAINE HCL (PF) 2 % IJ SOLN
INTRAMUSCULAR | Status: AC
  Filled 2020-08-18: qty 5

## 2020-08-18 MED ORDER — CHLORHEXIDINE GLUCONATE 0.12 % MT SOLN: 15.0000 mL | Freq: Once | OROMUCOSAL | Status: AC

## 2020-08-18 MED ORDER — ONDANSETRON HCL 4 MG/2ML IJ SOLN
INTRAMUSCULAR | Status: DC | PRN
  Administered 2020-08-18: 4 mg via INTRAVENOUS

## 2020-08-18 MED ORDER — SODIUM CHLORIDE 0.9 % IV SOLN: INTRAVENOUS | Status: DC

## 2020-08-18 MED ORDER — FENTANYL CITRATE (PF) 100 MCG/2ML IJ SOLN
INTRAMUSCULAR | Status: AC
  Filled 2020-08-18: qty 2

## 2020-08-18 MED ORDER — LIDOCAINE HCL (CARDIAC) PF 100 MG/5ML IV SOSY
PREFILLED_SYRINGE | INTRAVENOUS | Status: DC | PRN
  Administered 2020-08-18: 80 mg via INTRAVENOUS

## 2020-08-18 MED ORDER — FENTANYL CITRATE (PF) 100 MCG/2ML IJ SOLN
INTRAMUSCULAR | Status: DC | PRN
  Administered 2020-08-18: 25 ug via INTRAVENOUS

## 2020-08-18 MED ORDER — ONDANSETRON HCL 4 MG/2ML IJ SOLN: 4.0000 mg | Freq: Once | INTRAMUSCULAR | Status: DC | PRN

## 2020-08-18 SURGICAL SUPPLY — 49 items
BLADE MED AGGRESSIVE (BLADE) ×3 IMPLANT
BLADE OSC/SAGITTAL MD 5.5X18 (BLADE) IMPLANT
BLADE SURG 15 STRL LF DISP TIS (BLADE) IMPLANT
BLADE SURG 15 STRL SS (BLADE)
BLADE SURG MINI STRL (BLADE) IMPLANT
BNDG CONFORM 2 STRL LF (GAUZE/BANDAGES/DRESSINGS) IMPLANT
BNDG ELASTIC 4X5.8 VLCR STR LF (GAUZE/BANDAGES/DRESSINGS) ×2 IMPLANT
BNDG ESMARK 4X12 TAN STRL LF (GAUZE/BANDAGES/DRESSINGS) ×2 IMPLANT
BNDG GAUZE 4.5X4.1 6PLY STRL (MISCELLANEOUS) ×1 IMPLANT
CNTNR SPEC 2.5X3XGRAD LEK (MISCELLANEOUS) ×1
CONT SPEC 4OZ STER OR WHT (MISCELLANEOUS) ×1
CONTAINER SPEC 2.5X3XGRAD LEK (MISCELLANEOUS) ×1 IMPLANT
COVER WAND RF STERILE (DRAPES) ×2 IMPLANT
CUFF TOURN DUAL PL 12 NO SLV (MISCELLANEOUS) IMPLANT
CUFF TOURN SGL QUICK 18X4 (TOURNIQUET CUFF) IMPLANT
DRAPE FLUOR MINI C-ARM 54X84 (DRAPES) IMPLANT
DURAPREP 26ML APPLICATOR (WOUND CARE) ×2 IMPLANT
ELECT REM PT RETURN 9FT ADLT (ELECTROSURGICAL) ×2
ELECTRODE REM PT RTRN 9FT ADLT (ELECTROSURGICAL) ×1 IMPLANT
GAUZE SPONGE 4X4 12PLY STRL (GAUZE/BANDAGES/DRESSINGS) ×4 IMPLANT
GAUZE XEROFORM 1X8 LF (GAUZE/BANDAGES/DRESSINGS) ×2 IMPLANT
GLOVE BIO SURGEON STRL SZ7 (GLOVE) ×2 IMPLANT
GLOVE INDICATOR 7.0 STRL GRN (GLOVE) ×2 IMPLANT
GOWN STRL REUS W/ TWL LRG LVL3 (GOWN DISPOSABLE) ×2 IMPLANT
GOWN STRL REUS W/TWL LRG LVL3 (GOWN DISPOSABLE) ×2
HANDPIECE VERSAJET DEBRIDEMENT (MISCELLANEOUS) ×1 IMPLANT
KIT TURNOVER KIT A (KITS) ×2 IMPLANT
LABEL OR SOLS (LABEL) ×2 IMPLANT
MANIFOLD NEPTUNE II (INSTRUMENTS) ×2 IMPLANT
NDL FILTER BLUNT 18X1 1/2 (NEEDLE) ×1 IMPLANT
NDL HYPO 25X1 1.5 SAFETY (NEEDLE) ×2 IMPLANT
NEEDLE FILTER BLUNT 18X 1/2SAF (NEEDLE) ×1
NEEDLE FILTER BLUNT 18X1 1/2 (NEEDLE) ×1 IMPLANT
NEEDLE HYPO 25X1 1.5 SAFETY (NEEDLE) ×4 IMPLANT
NS IRRIG 500ML POUR BTL (IV SOLUTION) ×2 IMPLANT
PACK EXTREMITY (MISCELLANEOUS) ×2 IMPLANT
PULSAVAC PLUS IRRIG FAN TIP (DISPOSABLE) ×2
SOL .9 NS 3000ML IRR  AL (IV SOLUTION)
SOL .9 NS 3000ML IRR UROMATIC (IV SOLUTION) IMPLANT
SOL PREP PVP 2OZ (MISCELLANEOUS) ×2
SOLUTION PREP PVP 2OZ (MISCELLANEOUS) ×1 IMPLANT
STOCKINETTE STRL 6IN 960660 (GAUZE/BANDAGES/DRESSINGS) ×2 IMPLANT
SUT ETHILON 3-0 FS-10 30 BLK (SUTURE) ×4
SUT VIC AB 3-0 SH 27 (SUTURE) ×1
SUT VIC AB 3-0 SH 27X BRD (SUTURE) ×1 IMPLANT
SUTURE EHLN 3-0 FS-10 30 BLK (SUTURE) ×2 IMPLANT
SWAB CULTURE AMIES ANAERIB BLU (MISCELLANEOUS) IMPLANT
SYR 10ML LL (SYRINGE) ×2 IMPLANT
TIP FAN IRRIG PULSAVAC PLUS (DISPOSABLE) IMPLANT

## 2020-08-18 NOTE — Anesthesia Preprocedure Evaluation (Addendum)
Anesthesia Evaluation  Patient identified by MRN, date of birth, ID band Patient awake    Reviewed: Allergy & Precautions, H&P , NPO status , Patient's Chart, lab work & pertinent test results, reviewed documented beta blocker date and time   Airway Mallampati: II  TM Distance: >3 FB Neck ROM: full    Dental  (+) Teeth Intact   Pulmonary neg pulmonary ROS, Current Smoker,    Pulmonary exam normal        Cardiovascular Exercise Tolerance: Good hypertension, On Medications + Peripheral Vascular Disease  Normal cardiovascular exam Rate:Normal     Neuro/Psych negative neurological ROS  negative psych ROS   GI/Hepatic negative GI ROS, Neg liver ROS,   Endo/Other  negative endocrine ROSdiabetes  Renal/GU Renal disease  negative genitourinary   Musculoskeletal   Abdominal   Peds  Hematology negative hematology ROS (+)   Anesthesia Other Findings   Reproductive/Obstetrics negative OB ROS                             Anesthesia Physical Anesthesia Plan  ASA: III  Anesthesia Plan: General   Post-op Pain Management:    Induction: Intravenous  PONV Risk Score and Plan:   Airway Management Planned: LMA  Additional Equipment:   Intra-op Plan:   Post-operative Plan: Extubation in OR  Informed Consent: I have reviewed the patients History and Physical, chart, labs and discussed the procedure including the risks, benefits and alternatives for the proposed anesthesia with the patient or authorized representative who has indicated his/her understanding and acceptance.       Plan Discussed with: CRNA  Anesthesia Plan Comments:        Anesthesia Quick Evaluation

## 2020-08-18 NOTE — Anesthesia Postprocedure Evaluation (Signed)
Anesthesia Post Note  Patient: William Melton  Procedure(s) Performed: PARTIAL AMPUTATION  OF 4TH AND 5TH RAY (Left )  Patient location during evaluation: PACU Anesthesia Type: General Level of consciousness: awake and alert and oriented Pain management: pain level controlled Vital Signs Assessment: post-procedure vital signs reviewed and stable Respiratory status: spontaneous breathing Cardiovascular status: blood pressure returned to baseline Anesthetic complications: no   No complications documented.   Last Vitals:  Vitals:   08/18/20 1832 08/18/20 1839  BP: (!) 141/76 (!) 149/85  Pulse: 79 82  Resp: 10 12  Temp: 36.9 C   SpO2: 96% 94%    Last Pain:  Vitals:   08/18/20 1832  TempSrc:   PainSc: 0-No pain                 [name]

## 2020-08-18 NOTE — Op Note (Signed)
PODIATRY / FOOT AND ANKLE SURGERY OPERATIVE REPORT    SURGEON: Rosetta Posner, DPM  PRE-OPERATIVE DIAGNOSIS:  1.  Left forefoot gangrenous necrosis 2.  Gas gangrene left forefoot 3.  Osteomyelitis left fourth and fifth rays 4.  PVD with gangrene of left forefoot 5.  Diabetes type 2 polyneuropathy  POST-OPERATIVE DIAGNOSIS: Same  PROCEDURE(S): 1. Left partial fourth ray amputation 2. Left partial fifth ray amputation 3. Debridement of all nonviable necrotic tissues left foot to bone  HEMOSTASIS: No tourniquet  ANESTHESIA: general  ESTIMATED BLOOD LOSS: 15 cc  FINDING(S): 1.  Gangrenous necrosis with foul odor present with potential gas in tissues to the left forefoot 2.  Minimal bleeding occurred with amputation despite no tourniquet being used.  Concern for patient blood flow and healing process.  Currently do not believe patient could heal a transmetatarsal amputation in the current state that he is in.  PATHOLOGY/SPECIMEN(S): Left partial fourth and fifth rays with proximal margin marked in purple ink.  Wound culture also taken from left fourth ray/fifth ray amputation sites.  INDICATIONS:   William Melton is a 69 y.o. male who presents with worsening necrosis of the left forefoot over the fourth and fifth rays.  Patient was recently seen by Dr. Gilda Crease who performed a revascularization to the left side but patient still had minimal blood flow to the left foot.  Patient began having worsening necrosis to the lateral aspect of the foot and was sent to the emergency room due to worsening pain as well as potential concerns for worsening necrosis and infection.  Patient was seen in the hospital and was noted to have gas in tissues potentially on x-ray examination.  Patient appeared to have worsening necrosis of the fourth and fifth rays especially to the digits and metatarsal phalangeal joint areas as well as to the plantar midfoot to the metatarsal bases.  Podiatry team was consulted for  further evaluation and examination.  All treatment options were discussed with the patient both conservative and surgical attempts at correction including potential risks and complications.  At this time patient is elected for procedure consisting of left partial fourth and fifth ray amputations with debridement of all nonviable and necrotic tissues to the left foot to bone.  Discussed with patient that due to lack of blood flow present and extent of gangrene that he could potentially lose his left lower extremity.  We will do our best to try to save his limb but patient will ultimately likely need at least a transmetatarsal amputation versus more proximal amputation prior to discharge.  Patient understands and is agreeable to procedure.  DESCRIPTION: After obtaining full informed written consent, the patient was brought back to the operating room and placed supine upon the operating table.  The patient received IV antibiotics prior to induction.  After obtaining adequate anesthesia, 20 cc of half percent Marcaine plain was injected about the left forefoot and ankle in an ankle type block and in the area of the amputation sites of the fourth and fifth rays.  The patient was prepped and draped in the standard fashion.  A tourniquet was applied prior to prepping but was not used throughout the remainder the case around the ankle.  Attention was then directed to the left fourth and fifth rays where an incision was made around all the necrotic tissue and nonviable tissue at the plantar lateral foot and extended around the fourth and fifth rays around the digits metatarsal phalangeal joints and a racquet type of  incision with the racquet ending at the proximal fifth metatarsal base area.  The incision was made straight to bone at that level.  The fourth and fifth metatarsal phalangeal joints were identified and there was notable purulence and odor coming from the area consistent with likely gas gangrene and  gangrenous necrosis.  A wound culture was taken from the site and passed off.  The metatarsal phalangeal joints were disarticulated and the digits were passed off in the operative site and sent off to pathology.  Metatarsals 4 and 5 were then circumferentially dissected to the bases and the sagittal bone saw was used to resect the fourth and fifth metatarsals to the proximal bases.  The metatarsals were passed off the operative site and sent off for pathology with the proximal margins marked in purple ink.  There appeared to be also some necrotic tissue overlying the lateral aspect of the third metatarsal phalangeal joint.  This necrotic tissue was resected and passed off the operative site involving a portion of the lateral aspect of the third metatarsal head and base.  The tissues did not bleed very much during this but did appear to be more healthy and viable at this level although minimal bleeding once again still occurred to the areas of the amputation sites.  The Versajet was used to debride the tissues further to bone and subcutaneous tissue removing all nonviable necrotic tissues.  The surgical site was flushed with copious amounts normal sterile saline with the pulse lavage 3 L of fluid.  Partial closure was able to be obtained with some retention sutures utilizing 3-0 nylon.  The wound was then packed with vancomycin powder and iodoform packing gauze.  A postoperative dressing was then applied consisting of 4 x 4 gauze, ABD, Kerlix, Ace wrap.  The patient tolerated the procedure and anesthesia well and transferred to recovery room vital signs stable vascular status still remaining questionable to the left lower extremity but unchanged compared to preoperative state.  Once again patient bled very minimally during procedure without the tourniquet indicating that vascular supply is compromised indicating that patient likely does not have enough flow to heal a transmetatarsal amputation at this point.   Discussed with Dr. Gilda Crease in detail as well.  Patient undergo angiogram tomorrow and close follow-up.  Patient should remain nonweightbearing to left lower extremity.  Discussed the case with patient's son William Melton in detail and fear that patient may potentially lose his leg.  Patient will need transmetatarsal amputation in the future versus below-knee amputation as this was a staged procedure today to remove infection.  COMPLICATIONS: None  CONDITION: Good, stable  Rosetta Posner, DPM

## 2020-08-18 NOTE — Progress Notes (Signed)
[name], male   DOB: Jul 24, 1951, 69 y.o.   MRN: [id number] [facility] PROGRESS NOTE  William Melton DOB: [birth year] DOA: 08/16/2020 PCP: Altamease Oiler, FNP  HPI/Subjective: Patient does have some pain in his left fourth and fifth toes that are black.  Came in with gangrene.  Patient has had poor circulation and a recent angiogram.  Patient states that the blackness on his toes started a few days after the angiogram.  Objective: Vitals:   08/18/20 0810 08/18/20 1155  BP: 138/70 (!) 163/78  Pulse: 69 66  Resp: 18 15  Temp: (!) 97.5 F (36.4 C) (!) 97.5 F (36.4 C)  SpO2: 100% 100%    Intake/Output Summary (Last 24 hours) at 08/18/2020 1256 Last data filed at 08/18/2020 0951 Gross per 24 hour  Intake 1600.08 ml  Output 775 ml  Net 825.08 ml   Filed Weights   08/16/20 1718  Weight: 86.2 kg    ROS: Review of Systems  Respiratory: Negative for shortness of breath.   Cardiovascular: Negative for chest pain.  Gastrointestinal: Negative for abdominal pain, nausea and vomiting.  Musculoskeletal: Positive for joint pain.   Exam: Physical Exam HENT:     Head: Normocephalic.     Mouth/Throat:     Pharynx: No oropharyngeal exudate.  Eyes:     General: Lids are normal.     Conjunctiva/sclera: Conjunctivae normal.     Pupils: Pupils are equal, round, and reactive to light.  Cardiovascular:     Rate and Rhythm: Normal rate and regular rhythm.     Heart sounds: S1 normal and S2 normal. Murmur heard.   Systolic murmur is present with a grade of 3/6.   Pulmonary:     Breath sounds: No decreased breath sounds, wheezing, rhonchi or rales.  Abdominal:     Palpations: Abdomen is soft.     Tenderness: There is no abdominal tenderness.  Musculoskeletal:     Right lower leg: No swelling.     Left lower leg: No swelling.  Skin:    General: Skin is warm.     Comments: Gangrene left fifth and fourth toe and going down into the metatarsal  region.  Neurological:     Mental Status: He is alert and oriented to person, place, and time.       Data Reviewed: Basic Metabolic Panel: Recent Labs  Lab 08/16/20 1721 08/17/20 0312 08/18/20 0412  NA 137 136 137  K 3.0* 3.4* 3.7  CL 99 99 99  CO2 24 25 26   GLUCOSE 81 98 125*  BUN 31* 32* 24*  CREATININE 1.81* 1.80* 1.58*  CALCIUM 9.1 8.6* 8.5*   Liver Function Tests: Recent Labs  Lab 08/16/20 1721  AST 16  ALT 11  ALKPHOS 72  BILITOT 1.3*  PROT 7.8  ALBUMIN 2.5*   CBC: Recent Labs  Lab 08/16/20 1721 08/17/20 0312 08/18/20 0412  WBC 13.0* 12.9* 12.5*  HGB 12.9* 11.9* 11.8*  HCT 40.2 37.2* 37.6*  MCV 80.1 80.5 80.5  PLT 445* 393 404*    CBG: Recent Labs  Lab 08/17/20 1311 08/17/20 1736 08/17/20 2105 08/18/20 0808 08/18/20 1156  GLUCAP 141* 165* 158* 126* 91    Recent Results (from the past 240 hour(s))  Blood culture (routine x 2)     Status: None (Preliminary result)   Collection Time: 08/16/20  7:40 PM   Specimen: BLOOD RIGHT HAND  Result Value Ref Range Status   Specimen Description BLOOD RIGHT  HAND  Final   Special Requests   Final    BOTTLES DRAWN AEROBIC AND ANAEROBIC Blood Culture adequate volume   Culture   Final    NO GROWTH 2 DAYS Performed at [facility], [address] [place], [place] [address]    Report Status PENDING  Incomplete  Blood culture (routine x 2)     Status: None (Preliminary result)   Collection Time: 08/16/20  7:40 PM   Specimen: Left Antecubital; Blood  Result Value Ref Range Status   Specimen Description LEFT ANTECUBITAL  Final   Special Requests   Final    BOTTLES DRAWN AEROBIC ONLY Blood Culture adequate volume   Culture   Final    NO GROWTH 2 DAYS Performed at [facility], [address], [place], [place] [address]    Report Status PENDING  Incomplete  Resp Panel by RT-PCR (Flu A&B, Covid) Nasopharyngeal Swab     Status: None   Collection Time: 08/16/20  7:40 PM   Specimen:  Nasopharyngeal Swab; Nasopharyngeal(NP) swabs in vial transport medium  Result Value Ref Range Status   SARS Coronavirus 2 by RT PCR NEGATIVE NEGATIVE Final    Comment: (NOTE) SARS-CoV-2 target nucleic acids are NOT DETECTED.  The SARS-CoV-2 RNA is generally detectable in upper respiratory specimens during the acute phase of infection. The lowest concentration of SARS-CoV-2 viral copies this assay can detect is 138 copies/mL. A negative result does not preclude SARS-Cov-2 infection and should not be used as the sole basis for treatment or other patient management decisions. A negative result may occur with  improper specimen collection/handling, submission of specimen other than nasopharyngeal swab, presence of viral mutation(s) within the areas targeted by this assay, and inadequate number of viral copies(<138 copies/mL). A negative result must be combined with clinical observations, patient history, and epidemiological information. The expected result is Negative.  Fact Sheet for Patients:  BloggerCourse.com  Fact Sheet for Healthcare Providers:  SeriousBroker.it  This test is no t yet approved or cleared by the [place] FDA and  has been authorized for detection and/or diagnosis of SARS-CoV-2 by FDA under an Emergency Use Authorization (EUA). This EUA will remain  in effect (meaning this test can be used) for the duration of the COVID-19 declaration under Section 564(b)(1) of the Act, 21 U.S.C.section 360bbb-3(b)(1), unless the authorization is terminated  or revoked sooner.       Influenza A by PCR NEGATIVE NEGATIVE Final   Influenza B by PCR NEGATIVE NEGATIVE Final    Comment: (NOTE) The Xpert Xpress SARS-CoV-2/FLU/RSV plus assay is intended as an aid in the diagnosis of influenza from Nasopharyngeal swab specimens and should not be used as a sole basis for treatment. Nasal washings and aspirates are unacceptable for  Xpert Xpress SARS-CoV-2/FLU/RSV testing.  Fact Sheet for Patients: BloggerCourse.com  Fact Sheet for Healthcare Providers: SeriousBroker.it  This test is not yet approved or cleared by the [place] FDA and has been authorized for detection and/or diagnosis of SARS-CoV-2 by FDA under an Emergency Use Authorization (EUA). This EUA will remain in effect (meaning this test can be used) for the duration of the COVID-19 declaration under Section 564(b)(1) of the Act, 21 U.S.C. section 360bbb-3(b)(1), unless the authorization is terminated or revoked.  Performed at [facility], [address] [place], [place] [address]   Surgical pcr screen     Status: None   Collection Time: 08/17/20  5:30 PM   Specimen: Nasal Mucosa; Nasal Swab  Result Value Ref Range Status   MRSA, PCR NEGATIVE NEGATIVE Final   Staphylococcus aureus NEGATIVE NEGATIVE Final    Comment: (NOTE) The Xpert SA Assay (FDA approved for NASAL specimens in patients 622 years of age and older), is one component of a comprehensive surveillance program. It is not intended to diagnose infection nor to guide or monitor treatment. Performed at [facility], [address] [place], [place] [address]      [facility]  Result Date: 08/16/2020 CLINICAL DATA:  Questionable sepsis. Wound infection. Hx of DM, HTN, current smoker. EXAM: PORTABLE CHEST 1 VIEW COMPARISON:  None. FINDINGS: The cardiomediastinal contours are within normal limits. Aortic arch calcification. The lungs are clear. No pneumothorax or large pleural effusion. No acute finding in the visualized skeleton. IMPRESSION: No evidence of active disease. Electronically Signed   By: Emmaline KluverNancy  Ballantyne M.D.   On: 08/16/2020 18:56   DG Foot Complete Left  Result Date: 08/16/2020 CLINICAL DATA:  Infection to left fourth and fifth toes for 1 week. Diabetes. EXAM: LEFT FOOT -  COMPLETE 3+ VIEW COMPARISON:  Radiograph 06/17/2020 FINDINGS: Atrophic soft tissues of the fourth and fifth digits with patchy subcutaneous gas. Soft tissue air is also seen tracking proximally adjacent to the distal fifth metatarsal head. Soft tissue gas obscures detailed bony assessment, there is no gross from bony destruction. No radiopaque foreign body. Unchanged appearance of great toe proximal phalanx fracture with well-defined margins and erosion involving the medial aspect. The bones are diffusely under mineralized. No acute fracture. Plantar calcaneal spur and Achilles tendon enthesophyte. There are vascular calcifications. IMPRESSION: 1. Atrophic soft tissues of the fourth and fifth digits with patchy soft tissue air which tracks proximally adjacent to the distal fifth metatarsal head. Findings consistent with gas gangrene. No gross radiographic findings of osteomyelitis. 2. Unchanged appearance of the chronic great toe proximal phalanx fracture. Electronically Signed   By: Narda RutherfordMelanie  Sanford M.D.   On: 08/16/2020 18:12    Scheduled Meds: . amLODipine  10 mg Oral Daily  . insulin aspart  0-9 Units Subcutaneous TID WC  . insulin glargine  20 Units Subcutaneous QHS  . nicotine  21 mg Transdermal Daily  . pravastatin  20 mg Oral q1800   Continuous Infusions: . sodium chloride 50 mL/hr at 08/18/20 1023  . meropenem (MERREM) IV 1 g (08/18/20 1022)  . vancomycin Stopped (08/17/20 2327)    Assessment/Plan:  1. Peripheral vascular disease with limb threatening ischemia with gangrene of left fourth and fifth toes which is dry but potential for gas gangrene on the foot.  Podiatry to take to the operating room this evening for amputation.  Patient on aggressive antibiotics at this point and likely can downgrade antibiotics after surgery.  Holding Plavix prior to surgery.  Vascular surgery planning on potential angiogram after procedure.  Patient had lactic acidosis and leukocytosis. 2. 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  With IV fluids creatinine came down to 1.58 today.  Patient on low-dose Lantus plus sliding scale.  Hemoglobin A1c is 8.7. 3. Essential hypertension on Norvasc 4. Hyperlipidemia unspecified on pravastatin 5. Tobacco abuse on nicotine patch  Code Status:     Code Status Orders  (From admission, onward)         Start     Ordered   08/16/20 1927  Full code  Continuous        08/16/20 1928        Code  Status History    Date Active Date Inactive Code Status Order ID Comments User Context   08/05/2020 0945 08/05/2020 1627 Full Code [id number]  Schnier, Latina Craver, MD Inpatient   Advance Care Planning Activity     Family Communication: Spoke with son on the phone Disposition Plan: Status is: Inpatient  Dispo: The patient is from: Home              Anticipated d/c is to: Home              Anticipated d/c date is: Depending on when angiogram is done likely a day or 2 after.  I am thinking maybe near the end of the week on Thursday or Friday.              Patient currently being treated for gangrene of the fourth and fifth toes.  Will end up needing another angiogram.  Patient for amputation today.  Consultants:  Podiatry  Vascular surgery  Antibiotics:  Meropenem  Vancomycin  Time spent: 27 minutes  Takeela [employer]

## 2020-08-18 NOTE — Progress Notes (Signed)
Patient received from PACU RN.  Patient alert and oriented, denies pain and wants dinner.  Dinner ordered.  PIV infusing NS@50 .  Dressing to L foot intact, no drainage noted.

## 2020-08-18 NOTE — Transfer of Care (Signed)
Immediate Anesthesia Transfer of Care Note  Patient: William Melton  Procedure(s) Performed: PARTIAL AMPUTATION  OF 4TH AND 5TH RAY (Left )  Patient Location: PACU  Anesthesia Type:General  Level of Consciousness: awake and alert   Airway & Oxygen Therapy: Patient Spontanous Breathing and Patient connected to nasal cannula oxygen  Post-op Assessment: Report given to RN and Post -op Vital signs reviewed and stable  Post vital signs: Reviewed and stable  Last Vitals:  Vitals Value Taken Time  BP 110/68 08/18/20 1810  Temp    Pulse 79 08/18/20 1813  Resp 13 08/18/20 1813  SpO2 96 % 08/18/20 1813  Vitals shown include unvalidated device data.  Last Pain:  Vitals:   08/18/20 1645  TempSrc: Oral  PainSc: 2       Patients Stated Pain Goal: 0 (08/17/20 1800)  Complications: No complications documented.

## 2020-08-18 NOTE — Anesthesia Procedure Notes (Signed)
Procedure Name: LMA Insertion Performed by: Estelene Carmack Ben, CRNA Pre-anesthesia Checklist: Patient identified, Emergency Drugs available, Suction available and Patient being monitored Patient Re-evaluated:Patient Re-evaluated prior to induction Oxygen Delivery Method: Circle system utilized Preoxygenation: Pre-oxygenation with 100% oxygen Induction Type: IV induction Ventilation: Mask ventilation without difficulty LMA: LMA inserted LMA Size: 4.0 Tube type: Oral Number of attempts: 1 Airway Equipment and Method: Oral airway Placement Confirmation: ETT inserted through vocal cords under direct vision,  positive ETCO2 and breath sounds checked- equal and bilateral Tube secured with: Tape Dental Injury: Teeth and Oropharynx as per pre-operative assessment        

## 2020-08-18 NOTE — H&P (Signed)
HISTORY AND PHYSICAL INTERVAL NOTE:  08/18/2020  5:04 PM  William Melton  has presented today for surgery, with the diagnosis of LEFT FOREFOOT GANGRENE.  The various methods of treatment have been discussed with the patient.  No guarantees were given.  After consideration of risks, benefits and other options for treatment, the patient has consented to surgery.  I have reviewed the patients' chart and labs.    PROCEDURE: LEFT PARTIAL 4TH AND 5TH RAY AMPTUTATIONS WITH DEBRIDEMENT AND REMOVAL OF ALL NONVIABLE AND NECROTIC TISSUE   A history and physical examination was performed in my office.  The patient was reexamined.  There have been no changes to this history and physical examination.  Rosetta Posner, DPM

## 2020-08-19 ENCOUNTER — Other Ambulatory Visit (INDEPENDENT_AMBULATORY_CARE_PROVIDER_SITE_OTHER): Payer: Self-pay | Admitting: Vascular Surgery

## 2020-08-19 ENCOUNTER — Encounter: Payer: Self-pay | Admitting: Podiatry

## 2020-08-19 ENCOUNTER — Encounter
Admission: EM | Disposition: A | Discharge status: Skilled Nursing Facility | Payer: Self-pay | Source: Home / Self Care | Attending: Internal Medicine

## 2020-08-19 DIAGNOSIS — N189 Chronic kidney disease, unspecified: Secondary | ICD-10-CM

## 2020-08-19 DIAGNOSIS — N179 Acute kidney failure, unspecified: Secondary | ICD-10-CM

## 2020-08-19 DIAGNOSIS — I70262 Atherosclerosis of native arteries of extremities with gangrene, left leg: Secondary | ICD-10-CM

## 2020-08-19 HISTORY — PX: LOWER EXTREMITY ANGIOGRAPHY: CATH118251

## 2020-08-19 LAB — BASIC METABOLIC PANEL
Anion gap: 10 (ref 5–15)
BUN: 23 mg/dL (ref 8–23)
CO2: 24 mmol/L (ref 22–32)
Calcium: 8.5 mg/dL — ABNORMAL LOW (ref 8.9–10.3)
Chloride: 102 mmol/L (ref 98–111)
Creatinine, Ser: 1.5 mg/dL — ABNORMAL HIGH (ref 0.61–1.24)
GFR, Estimated: 51 mL/min — ABNORMAL LOW (ref >60)
Glucose, Bld: 193 mg/dL — ABNORMAL HIGH (ref 70–99)
Potassium: 3.8 mmol/L (ref 3.5–5.1)
Sodium: 136 mmol/L (ref 135–145)

## 2020-08-19 LAB — GLUCOSE, CAPILLARY
Glucose-Capillary: 177 mg/dL — ABNORMAL HIGH (ref 70–99)
Glucose-Capillary: 126 mg/dL — ABNORMAL HIGH (ref 70–99)
Glucose-Capillary: 90 mg/dL (ref 70–99)
Glucose-Capillary: 77 mg/dL (ref 70–99)
Glucose-Capillary: 127 mg/dL — ABNORMAL HIGH (ref 70–99)

## 2020-08-19 SURGERY — LOWER EXTREMITY ANGIOGRAPHY
Anesthesia: Moderate Sedation | Laterality: Left

## 2020-08-19 MED ORDER — MIDAZOLAM HCL 2 MG/2ML IJ SOLN
INTRAMUSCULAR | Status: DC | PRN
  Administered 2020-08-19: 0.5 mg via INTRAVENOUS
  Administered 2020-08-19: 2 mg via INTRAVENOUS

## 2020-08-19 MED ORDER — FENTANYL CITRATE (PF) 100 MCG/2ML IJ SOLN
INTRAMUSCULAR | Status: DC | PRN
  Administered 2020-08-19: 25 ug via INTRAVENOUS
  Administered 2020-08-19: 50 ug via INTRAVENOUS

## 2020-08-19 MED ORDER — MIDAZOLAM HCL 2 MG/ML PO SYRP: 8.0000 mg | ORAL_SOLUTION | Freq: Once | ORAL | Status: DC | PRN

## 2020-08-19 MED ORDER — SODIUM CHLORIDE 0.9 % IV SOLN: INTRAVENOUS | Status: DC

## 2020-08-19 MED ORDER — HEPARIN (PORCINE) 25000 UT/250ML-% IV SOLN
1050.0000 [IU]/h | INTRAVENOUS | Status: DC
  Administered 2020-08-19: 21:00:00 1050 [IU]/h via INTRAVENOUS
  Filled 2020-08-19: qty 250

## 2020-08-19 MED ORDER — HYDROCODONE-ACETAMINOPHEN 5-325 MG PO TABS
ORAL_TABLET | ORAL | Status: AC
  Administered 2020-08-19: 17:00:00 1
  Filled 2020-08-19: qty 1

## 2020-08-19 MED ORDER — SODIUM CHLORIDE 0.9 % IV SOLN: INTRAVENOUS | Status: AC

## 2020-08-19 MED ORDER — HEPARIN BOLUS VIA INFUSION
2500.0000 [IU] | Freq: Once | INTRAVENOUS | Status: AC
  Administered 2020-08-19: 21:00:00 2500 [IU] via INTRAVENOUS
  Filled 2020-08-19: qty 2500

## 2020-08-19 MED ORDER — METHYLPREDNISOLONE SODIUM SUCC 125 MG IJ SOLR: 125.0000 mg | Freq: Once | INTRAMUSCULAR | Status: DC | PRN

## 2020-08-19 MED ORDER — MIDAZOLAM HCL 5 MG/5ML IJ SOLN
INTRAMUSCULAR | Status: AC
  Filled 2020-08-19: qty 5

## 2020-08-19 MED ORDER — SODIUM CHLORIDE 0.9% FLUSH
3.0000 mL | Freq: Two times a day (BID) | INTRAVENOUS | Status: DC
  Administered 2020-08-19 – 2020-08-25 (×7): 3 mL via INTRAVENOUS

## 2020-08-19 MED ORDER — NITROGLYCERIN 1 MG/10 ML FOR IR/CATH LAB
INTRA_ARTERIAL | Status: AC
  Filled 2020-08-19: qty 10

## 2020-08-19 MED ORDER — FAMOTIDINE 20 MG PO TABS: 40.0000 mg | ORAL_TABLET | Freq: Once | ORAL | Status: DC | PRN

## 2020-08-19 MED ORDER — SODIUM CHLORIDE 0.9% FLUSH
3.0000 mL | INTRAVENOUS | Status: DC | PRN
Start: 2020-08-19 — End: 2020-08-25

## 2020-08-19 MED ORDER — HYDROMORPHONE HCL 1 MG/ML IJ SOLN: 1.0000 mg | Freq: Once | INTRAMUSCULAR | Status: DC | PRN

## 2020-08-19 MED ORDER — ONDANSETRON HCL 4 MG/2ML IJ SOLN: 4.0000 mg | Freq: Four times a day (QID) | INTRAMUSCULAR | Status: DC | PRN

## 2020-08-19 MED ORDER — NITROGLYCERIN 1 MG/10 ML FOR IR/CATH LAB
INTRA_ARTERIAL | Status: DC | PRN
  Administered 2020-08-19: 300 ug
  Administered 2020-08-19: 200 ug

## 2020-08-19 MED ORDER — SODIUM CHLORIDE 0.9 % IV SOLN: 250.0000 mL | INTRAVENOUS | Status: DC | PRN

## 2020-08-19 MED ORDER — CEFAZOLIN SODIUM-DEXTROSE 1-4 GM/50ML-% IV SOLN
1.0000 g | Freq: Once | INTRAVENOUS | Status: AC
  Administered 2020-08-19: 16:00:00 1 g via INTRAVENOUS

## 2020-08-19 MED ORDER — CEFAZOLIN SODIUM-DEXTROSE 1-4 GM/50ML-% IV SOLN
INTRAVENOUS | Status: AC
  Filled 2020-08-19: qty 50

## 2020-08-19 MED ORDER — SODIUM CHLORIDE 0.9 % IV SOLN
3.0000 g | Freq: Four times a day (QID) | INTRAVENOUS | Status: DC
  Administered 2020-08-19 – 2020-08-21 (×7): 3 g via INTRAVENOUS
  Filled 2020-08-19: qty 3
  Filled 2020-08-19: qty 8
  Filled 2020-08-19: qty 3
  Filled 2020-08-19 (×7): qty 8

## 2020-08-19 MED ORDER — HEPARIN SODIUM (PORCINE) 1000 UNIT/ML IJ SOLN
INTRAMUSCULAR | Status: AC
  Filled 2020-08-19: qty 1

## 2020-08-19 MED ORDER — IODIXANOL 320 MG/ML IV SOLN
INTRAVENOUS | Status: DC | PRN
  Administered 2020-08-19: 17:00:00 75 mL

## 2020-08-19 MED ORDER — DIPHENHYDRAMINE HCL 50 MG/ML IJ SOLN: 50.0000 mg | Freq: Once | INTRAMUSCULAR | Status: DC | PRN

## 2020-08-19 MED ORDER — FENTANYL CITRATE (PF) 100 MCG/2ML IJ SOLN
INTRAMUSCULAR | Status: AC
  Filled 2020-08-19: qty 2

## 2020-08-19 SURGICAL SUPPLY — 19 items
BALLN ULTRASCOR 014 2.5X40X150 (BALLOONS) ×3
BALLN ULTRASCORE 014 2X100X150 (BALLOONS) ×3
BALLOON ULTRSCR 014 2.5X40X150 (BALLOONS) IMPLANT
BALLOON ULTRSCRE 014 2X100X150 (BALLOONS) IMPLANT
CATH ANGIO 5F PIGTAIL 65CM (CATHETERS) ×2 IMPLANT
CATH SEEKER 014X150 (CATHETERS) ×2 IMPLANT
CATH VERT 5FR 125CM (CATHETERS) ×2 IMPLANT
DEVICE STARCLOSE SE CLOSURE (Vascular Products) ×2 IMPLANT
GLIDEWIRE ADV .035X260CM (WIRE) ×2 IMPLANT
GUIDEWIRE STR TIP .014X300X8 (WIRE) ×2 IMPLANT
KIT ENCORE 26 ADVANTAGE (KITS) ×2 IMPLANT
NDL ENTRY 21GA 7CM ECHOTIP (NEEDLE) IMPLANT
NEEDLE ENTRY 21GA 7CM ECHOTIP (NEEDLE) ×3 IMPLANT
PACK ANGIOGRAPHY (CUSTOM PROCEDURE TRAY) ×3 IMPLANT
SET INTRO CAPELLA COAXIAL (SET/KITS/TRAYS/PACK) ×2 IMPLANT
SHEATH BRITE TIP 5FRX11 (SHEATH) ×2 IMPLANT
SHEATH RAABE 6FR (SHEATH) ×2 IMPLANT
WIRE GUIDERIGHT .035X150 (WIRE) ×3 IMPLANT
WIRE RUNTHROUGH .014X300CM (WIRE) ×2 IMPLANT

## 2020-08-19 NOTE — Progress Notes (Signed)
Patient c/o's discomfort 5/10 left foot, x1 norco/vicodin po for discomfort. Will alert receiving RN of administration. Patient tolerated gingerale, Report called to [facility] RN

## 2020-08-19 NOTE — Progress Notes (Signed)
[name], male   DOB: September 30, 1950, 69 y.o.   MRN: [id number] [facility] PROGRESS NOTE  William Melton ZTI:[id number] DOB: [birth year] DOA: 08/16/2020 PCP: Altamease Oiler, FNP  HPI/Subjective: Patient seen this morning.  Not having much pain at all.  Scheduled for angiogram today.  Came in with gangrene of the fourth and fifth toes.  Objective: Vitals:   08/19/20 0849 08/19/20 1152  BP: (!) 172/88 (!) 157/80  Pulse: 76 72  Resp:  17  Temp: (!) 97.5 F (36.4 C) (!) 97.3 F (36.3 C)  SpO2: 100% 100%    Intake/Output Summary (Last 24 hours) at 08/19/2020 1306 Last data filed at 08/19/2020 0915 Gross per 24 hour  Intake 800 ml  Output 770 ml  Net 30 ml   Filed Weights   08/16/20 1718  Weight: 86.2 kg    ROS: Review of Systems  Respiratory: Negative for shortness of breath.   Cardiovascular: Negative for chest pain.  Gastrointestinal: Negative for abdominal pain, nausea and vomiting.  Musculoskeletal: Positive for joint pain.   Exam: Physical Exam HENT:     Head: Normocephalic.     Mouth/Throat:     Pharynx: No oropharyngeal exudate.  Eyes:     General: Lids are normal.     Pupils: Pupils are equal, round, and reactive to light.  Cardiovascular:     Rate and Rhythm: Normal rate and regular rhythm.     Heart sounds: Normal heart sounds, S1 normal and S2 normal.  Pulmonary:     Breath sounds: Normal breath sounds. No decreased breath sounds, wheezing, rhonchi or rales.  Abdominal:     Palpations: Abdomen is soft.     Tenderness: There is no abdominal tenderness.  Musculoskeletal:     Right lower leg: No swelling.     Left lower leg: No swelling.  Skin:    General: Skin is warm.     Comments: Left foot covered.  Neurological:     Mental Status: He is alert and oriented to person, place, and time.       Data Reviewed: Basic Metabolic Panel: Recent Labs  Lab 08/16/20 1721 08/17/20 0312 08/18/20 0412 08/19/20 0712  NA 137 136  137 136  K 3.0* 3.4* 3.7 3.8  CL 99 99 99 102  CO2 24 25 26 24   GLUCOSE 81 98 125* 193*  BUN 31* 32* 24* 23  CREATININE 1.81* 1.80* 1.58* 1.50*  CALCIUM 9.1 8.6* 8.5* 8.5*   Liver Function Tests: Recent Labs  Lab 08/16/20 1721  AST 16  ALT 11  ALKPHOS 72  BILITOT 1.3*  PROT 7.8  ALBUMIN 2.5*   CBC: Recent Labs  Lab 08/16/20 1721 08/17/20 0312 08/18/20 0412  WBC 13.0* 12.9* 12.5*  HGB 12.9* 11.9* 11.8*  HCT 40.2 37.2* 37.6*  MCV 80.1 80.5 80.5  PLT 445* 393 404*    CBG: Recent Labs  Lab 08/18/20 1656 08/18/20 1826 08/18/20 2148 08/19/20 0823 08/19/20 1156  GLUCAP 91 100* 170* 177* 126*    Recent Results (from the past 240 hour(s))  Blood culture (routine x 2)     Status: None (Preliminary result)   Collection Time: 08/16/20  7:40 PM   Specimen: BLOOD RIGHT HAND  Result Value Ref Range Status   Specimen Description BLOOD RIGHT HAND  Final   Special Requests   Final    BOTTLES DRAWN AEROBIC AND ANAEROBIC Blood Culture adequate volume   Culture   Final    NO GROWTH  3 DAYS Performed at [facility], [address] [place], [place] [address]    Report Status PENDING  Incomplete  Blood culture (routine x 2)     Status: None (Preliminary result)   Collection Time: 08/16/20  7:40 PM   Specimen: Left Antecubital; Blood  Result Value Ref Range Status   Specimen Description LEFT ANTECUBITAL  Final   Special Requests   Final    BOTTLES DRAWN AEROBIC ONLY Blood Culture adequate volume   Culture   Final    NO GROWTH 3 DAYS Performed at [facility], [address] [place], [place] [address]    Report Status PENDING  Incomplete  Resp Panel by RT-PCR (Flu A&B, Covid) Nasopharyngeal Swab     Status: None   Collection Time: 08/16/20  7:40 PM   Specimen: Nasopharyngeal Swab; Nasopharyngeal(NP) swabs in vial transport medium  Result Value Ref Range Status   SARS Coronavirus 2 by RT PCR NEGATIVE NEGATIVE Final    Comment: (NOTE) SARS-CoV-2  target nucleic acids are NOT DETECTED.  The SARS-CoV-2 RNA is generally detectable in upper respiratory specimens during the acute phase of infection. The lowest concentration of SARS-CoV-2 viral copies this assay can detect is 138 copies/mL. A negative result does not preclude SARS-Cov-2 infection and should not be used as the sole basis for treatment or other patient management decisions. A negative result may occur with  improper specimen collection/handling, submission of specimen other than nasopharyngeal swab, presence of viral mutation(s) within the areas targeted by this assay, and inadequate number of viral copies(<138 copies/mL). A negative result must be combined with clinical observations, patient history, and epidemiological information. The expected result is Negative.  Fact Sheet for Patients:  BloggerCourse.com  Fact Sheet for Healthcare Providers:  SeriousBroker.it  This test is no t yet approved or cleared by the [place] FDA and  has been authorized for detection and/or diagnosis of SARS-CoV-2 by FDA under an Emergency Use Authorization (EUA). This EUA will remain  in effect (meaning this test can be used) for the duration of the COVID-19 declaration under Section 564(b)(1) of the Act, 21 U.S.C.section 360bbb-3(b)(1), unless the authorization is terminated  or revoked sooner.       Influenza A by PCR NEGATIVE NEGATIVE Final   Influenza B by PCR NEGATIVE NEGATIVE Final    Comment: (NOTE) The Xpert Xpress SARS-CoV-2/FLU/RSV plus assay is intended as an aid in the diagnosis of influenza from Nasopharyngeal swab specimens and should not be used as a sole basis for treatment. Nasal washings and aspirates are unacceptable for Xpert Xpress SARS-CoV-2/FLU/RSV testing.  Fact Sheet for Patients: BloggerCourse.com  Fact Sheet for Healthcare  Providers: SeriousBroker.it  This test is not yet approved or cleared by the [place] FDA and has been authorized for detection and/or diagnosis of SARS-CoV-2 by FDA under an Emergency Use Authorization (EUA). This EUA will remain in effect (meaning this test can be used) for the duration of the COVID-19 declaration under Section 564(b)(1) of the Act, 21 U.S.C. section 360bbb-3(b)(1), unless the authorization is terminated or revoked.  Performed at [facility], [address], [place], [place] [address]   Surgical pcr screen     Status: None   Collection Time: 08/17/20  5:30 PM   Specimen: Nasal Mucosa; Nasal Swab  Result Value Ref Range Status   MRSA, PCR NEGATIVE NEGATIVE Final   Staphylococcus aureus NEGATIVE NEGATIVE Final    Comment: (NOTE) The Xpert SA Assay (FDA approved for NASAL specimens  in patients 35 years of age and older), is one component of a comprehensive surveillance program. It is not intended to diagnose infection nor to guide or monitor treatment. Performed at [facility], [address], [place], [place] [address]      Studies: DG Foot 2 Views Left  Result Date: 08/18/2020 CLINICAL DATA:  Postop left foot surgery. EXAM: LEFT FOOT - 2 VIEW COMPARISON:  Preoperative radiograph 08/16/2020 FINDINGS: Transmetatarsal amputation of the fourth and fifth rays. Overlying dressing in place in the operative bed. Decreased density involving the lateral aspect of the third metatarsal head/neck may be related to superimposed soft tissue changes, postsurgical change, or less likely osteomyelitis. Chronic fracture of the great toe proximal phalanx unchanged. IMPRESSION: 1. Interval transmetatarsal amputation of the fourth and fifth rays. 2. Decreased density involving the lateral aspect of the third metatarsal head/neck may be related to superimposed soft tissue changes from recent surgery, postsurgical change, or less likely  osteomyelitis. Electronically Signed   By: Narda Rutherford M.D.   On: 08/18/2020 19:24    Scheduled Meds: . amLODipine  10 mg Oral Daily  . insulin aspart  0-9 Units Subcutaneous TID WC  . insulin glargine  20 Units Subcutaneous QHS  . nicotine  21 mg Transdermal Daily  . pravastatin  20 mg Oral q1800   Continuous Infusions: . sodium chloride 75 mL/hr at 08/19/20 1059  . ampicillin-sulbactam (UNASYN) IV      Assessment/Plan:  1. 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  Taken to the operating room on 08/18/2020 for amputation by podiatry. Please see operative report by Dr. Excell Seltzer.  For angiogram today with Dr. Gilda Crease vascular surgery.  Hopefully can go back on Plavix soon.  Aggressive antibiotics changed over to Unasyn today.  Follow-up operative culture. 2. Acute kidney injury on chronic kidney disease stage IIIa.  With IV fluids as able to get his creatinine down from 1.81 down to 1.50.  Continue fluids before angiogram.   3. 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  Patient on low-dose glargine insulin 20 units.  Hopefully can go back on higher dose insulin once eating 3 meals a day. 4. Essential hypertension on Norvasc 5. Hyperlipidemia unspecified on pravastatin 6. Tobacco abuse on nicotine patch 7. We will order physical therapy evaluation nonweightbearing left foot.    Code Status:     Code Status Orders  (From admission, onward)         Start     Ordered   08/16/20 1927  Full code  Continuous        08/16/20 1928        Code Status History    Date Active Date Inactive Code Status Order ID Comments User Context   08/05/2020 0945 08/05/2020 1627 Full Code [id number]  Schnier, Latina Craver, MD Inpatient   Advance Care Planning Activity     Family Communication: Spoke with son on the phone Disposition Plan: Status is: Inpatient  Dispo: The patient is from: Home               Anticipated d/c is to: Home              Anticipated d/c date is: Dependent on how the wound looks with podiatry evaluation.  Potentially 08/20/2020 versus 08/21/2020 depending on course.              Patient currently on IV antibiotics for gangrene.  Patient going to have an angiogram today with vascular surgery.  Consultants:  Podiatry  Vascular surgery  Procedures:  Left fourth and fifth toe amputation by podiatry  Will have angiogram today  Antibiotics:  Unasyn  Time spent: 27 minutes  [employer]  [facility]

## 2020-08-19 NOTE — Interval H&P Note (Signed)
History and Physical Interval Note:  08/19/2020 2:05 PM  William Melton  has presented today for surgery, with the diagnosis of Ischemia LEFT lower extremity/gangrene.  The various methods of treatment have been discussed with the patient and family. After consideration of risks, benefits and other options for treatment, the patient has consented to  Procedure(s): Lower Extremity Angiography (Left) as a surgical intervention.  The patient's history has been reviewed, patient examined, no change in status, stable for surgery.  I have reviewed the patient's chart and labs.  Questions were answered to the patient's satisfaction.     [name]

## 2020-08-19 NOTE — Progress Notes (Signed)
Patient's son Greer updated regarding procedure.  Patient states pain medication effective  "feels so much better, thank you"

## 2020-08-19 NOTE — Progress Notes (Signed)
Bedside report received from Gavin Pound, Charity fundraiser.  Patient s/p angioplasty for recanalization to Left Lower Extremity for limb salvage.  VSS.  Pain controlled.  Dsg to Right Hip site dressed with gauze and tegaderm; clean, dry and intact.  No hematoma noted.  Patient to remain flat until 1830.  Will continue to monitor and assess.

## 2020-08-19 NOTE — Care Management Important Message (Signed)
Important Message  Patient Details  Name: William Melton MRN: [id number] Date of Birth: 06-Aug-1951   Medicare Important Message Given:  Yes     [name] 08/19/2020, 11:44 AM

## 2020-08-19 NOTE — Progress Notes (Signed)
Daily Progress Note   Subjective  - 1 Day Post-Op  F/u left foot debridment.  No complaints.  Going for angio today  Objective Vitals:   08/19/20 0554 08/19/20 0845 08/19/20 0849 08/19/20 1152  BP: (!) 153/87 (!) 172/88 (!) 172/88 (!) 157/80  Pulse: 79  76 72  Resp: 20   17  Temp: 98 F (36.7 C)  (!) 97.5 F (36.4 C) (!) 97.3 F (36.3 C)  TempSrc:   Oral Oral  SpO2: 99%  100% 100%  Weight:      Height:        Physical Exam: Open wound with exposed 3rd metatarsal.  3rd toe a little dusky. S/P 4th and 5th ray amputation        Laboratory CBC    Component Value Date/Time   WBC 12.5 (H) 08/18/2020 0412   HGB 11.8 (L) 08/18/2020 0412   HCT 37.6 (L) 08/18/2020 0412   PLT 404 (H) 08/18/2020 0412    BMET    Component Value Date/Time   NA 136 08/19/2020 0712   K 3.8 08/19/2020 0712   CL 102 08/19/2020 0712   CO2 24 08/19/2020 0712   GLUCOSE 193 (H) 08/19/2020 0712   BUN 23 08/19/2020 0712   CREATININE 1.50 (H) 08/19/2020 0712   CALCIUM 8.5 (L) 08/19/2020 0712   GFRNONAA 51 (L) 08/19/2020 2947    Assessment/Planning: Gangrene with infection left foot   Dressing changed.    Going for angio today.  Limb salvage is concerning.  If pt has flow to foot may be able to convert to TMA for closure.    Podiatry to follow.  Gwyneth Revels A  08/19/2020, 1:52 PM

## 2020-08-19 NOTE — Op Note (Signed)
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  Post-operative diagnosis: Same  Procedure(s) Performed: 1. Introduction catheter into left lower extremity 3rd order catheter placement  2. Contrast injection left lower extremity for distal runoff with additional 3rd order  3. Percutaneous transluminal angioplasty left distal anterior tibial and dorsalis pedis artery              4. Star close closure right common femoral arteriotomy  Anesthesia: Conscious sedation was administered under my direct supervision by the interventional radiology RN. IV Versed plus fentanyl were utilized. Continuous ECG, pulse oximetry and blood pressure was monitored throughout the entire procedure.  Conscious sedation was for a total of 1 hour 19 seconds.  Sheath: 6 French Raby right common femoral retrograde  Contrast: 75 cc  Fluoroscopy Time: 7.5 minutes  Indications: William Melton presents with increasing pain of the left lower extremity.  Patient has significant gangrenous changes and abscess formation of his left foot.  He is undergoing debridement and drainage.  Bleeding at the time surgery was not as would be expected and so he is being returned to the angios suite to see if there is anything more that can be done to improve his perfusion.  This suggests the patient is having limb threatening ischemia. The risks and benefits are reviewed all questions answered patient agrees to proceed.  Procedure:William Melton is a 69 y.o. y.o. male who was identified and appropriate procedural time out was performed. The patient was then placed supine on the table and prepped and draped in the usual sterile fashion.   Ultrasound was placed in  the sterile sleeve and the right groin was evaluated the right common femoral artery was echolucent and pulsatile indicating patency. Image was recorded for the permanent record and under real-time visualization a microneedle was inserted into the common femoral artery followed by the microwire and then the micro-sheath. A J-wire was then advanced through the micro-sheath and a 5 [place] sheath was then inserted over a J-wire. J-wire was then advanced and a 5 French pigtail catheter was positioned at the level of T12.  AP projection of the aorta was then obtained. Pigtail catheter was repositioned to above the bifurcation and a RAO view of the pelvis was obtained. Subsequently a pigtail catheter with the stiff angle Glidewire was used to cross the aortic bifurcation the catheter wire were advanced down into the left distal external iliac artery. Oblique view of the femoral bifurcation was then obtained and subsequently the wire was reintroduced and the pigtail catheter negotiated into the SFA representing third order catheter placement. Distal runoff was then performed.  Diagnostic interpretation: The abdominal aorta is opacified with a bolus injection of contrast.  RAO projection of the pelvis is also obtained.  There do not appear to be any hemodynamically significant lesions noted within the aorta iliac system.  The left common femoral profunda femoris as well as the SFA and popliteal show diffuse atherosclerotic changes however I do not identify any hemodynamically significant lesions.  The trifurcation is diffusely diseased.  The tibioperoneal trunk posterior tibial and peroneal all demonstrate hemodynamically significant lesions with occlusions of the posterior tibial and peroneal in the mid calf.  The anterior tibial is patent down to the level of the malleolus.  Previously treated lesions to this level are widely patent.  There appears to  be a short segment occlusion of the distalmost anterior tibial  and proximal dorsalis pedis.  There is reconstitution of the dorsalis pedis in the mid forefoot and it fills the pedal arch and digital vessels.  5000 units of heparin was then given and allowed to circulate and a Raby sheath was advanced up and over the bifurcation and positioned in the femoral artery  Straight catheter and stiff angle Glidewire were then negotiated down into the distal popliteal. Catheter was then advanced. Hand injection contrast demonstrated the anterior tibial anatomy in detail.  Microcatheter and 0.014 wire used to cross the occlusion in the dorsalis pedis.  A 2 mm x 100 mm ultra score balloon was used to angioplasty the distal anterior tibial and the dorsalis pedis.  The inflation was for 1 minutes at 14 atm.  Follow-up imaging demonstrated persistent disease and therefore a 2.5 mm x 40 mm ultra score balloon was used 2 separate inflations were performed each to 14 atm for 1 minute.  Follow-up imaging demonstrated excellent patency with less than 10% residual stenosis and preservation of the distal runoff.  After review of these images the sheath is pulled into the right external iliac oblique of the common femoral is obtained and a Star close device deployed. There no immediate Complications.  Findings:  The abdominal aorta is opacified with a bolus injection of contrast.  RAO projection of the pelvis is also obtained.  There do not appear to be any hemodynamically significant lesions noted within the aorta iliac system.  The left common femoral profunda femoris as well as the SFA and popliteal show diffuse atherosclerotic changes however I do not identify any hemodynamically significant lesions.  The trifurcation is diffusely diseased.  The tibioperoneal trunk posterior tibial and peroneal all demonstrate hemodynamically significant lesions with occlusions of the posterior tibial and peroneal in the mid calf.  The anterior tibial is patent down to the level of the malleolus.   Previously treated lesions to this level are widely patent.  There appears to be a short segment occlusion of the distalmost anterior tibial and proximal dorsalis pedis.  There is reconstitution of the dorsalis pedis in the mid forefoot and it fills the pedal arch and digital vessels.  Following angioplasty the anterior tibial and dorsalis pedis is now widely patent and demonstrates in-line flow, there is less than 10% residual stenosis.    Summary: Successful recanalization left lower extremity for limb salvage   Disposition: Patient was taken to the recovery room in stable condition having tolerated the procedure well.  William Melton 08/19/2020,5:05 PM

## 2020-08-19 NOTE — Interval H&P Note (Signed)
History and Physical Interval Note:  08/19/2020 12:37 PM  William Melton  has presented today for surgery, with the diagnosis of Ischemia LEFT lower extremity/gangrene.  The various methods of treatment have been discussed with the patient and family. After consideration of risks, benefits and other options for treatment, the patient has consented to  Procedure(s): Lower Extremity Angiography (Left) as a surgical intervention.  The patient's history has been reviewed, patient examined, no change in status, stable for surgery.  I have reviewed the patient's chart and labs.  Questions were answered to the patient's satisfaction.     [name]

## 2020-08-19 NOTE — Progress Notes (Signed)
Patient arrived from room 229 Awake and alert, c/o's being hungry. Dressing intact left pedal/foot area, pulses with doppler  Blood sugar 90 @ 1430 Verbalizes understanding of procedure

## 2020-08-19 NOTE — Consult Note (Addendum)
ANTICOAGULATION CONSULT NOTE - Consult  Pharmacy Consult for Heparin gtt Indication: lower extremity recanullization (angioplasty Left lower limb)  No Known Allergies  Patient Measurements: Height: 6' (182.9 cm) Weight: 86.2 kg (190 lb 0.6 oz) IBW/kg (Calculated) : 77.6 Heparin Dosing Weight: 86.2kg  Vital Signs: Temp: 97.5 F (36.4 C) (12/14 1751) Temp Source: Axillary (12/14 1751) BP: 128/73 (12/14 1751) Pulse Rate: 82 (12/14 1751)  Labs: Recent Labs    08/16/20 2033 08/17/20 0312 08/18/20 0412 08/19/20 0712  HGB  --  11.9* 11.8*  --   HCT  --  37.2* 37.6*  --   PLT  --  393 404*  --   APTT 41*  --   --   --   LABPROT 14.5  --   --   --   INR 1.2  --   --   --   CREATININE  --  1.80* 1.58* 1.50*    Estimated Creatinine Clearance: 52.5 mL/min (A) (by C-G formula based on SCr of 1.5 mg/dL (H)).   Medications:    Assessment: Heparin Dosing Weight: 86.2kg. H/H 12.9/40.2>11.8/37.6; Plts 445>404. No pertinent drug allergies or interacting medications at present.  Goal of Therapy:  Heparin level 0.3-0.5 units/ml Monitor platelets by anticoagulation protocol: Yes   Plan:  Consulted to resume heparin 2hrs after sheath removal. Patient received 5000 units of heparin during procedure, so reduced to half-dose for first bolus. Infusion to start at ~12 u/kg/hr  Give 2500 units bolus x 1 Start heparin infusion at 1050 units/hr Check anti-Xa level in 6 hours and daily while on heparin Continue to monitor H&H and platelets   Paschal Dopp, PharmD, BCPS 08/19/2020,6:35 PM

## 2020-08-19 NOTE — Plan of Care (Addendum)

## 2020-08-20 ENCOUNTER — Encounter: Payer: Self-pay | Admitting: Vascular Surgery

## 2020-08-20 DIAGNOSIS — E1152 Type 2 diabetes mellitus with diabetic peripheral angiopathy with gangrene: Principal | ICD-10-CM

## 2020-08-20 DIAGNOSIS — N189 Chronic kidney disease, unspecified: Secondary | ICD-10-CM

## 2020-08-20 DIAGNOSIS — I70262 Atherosclerosis of native arteries of extremities with gangrene, left leg: Secondary | ICD-10-CM

## 2020-08-20 LAB — GLUCOSE, CAPILLARY
Glucose-Capillary: 93 mg/dL (ref 70–99)
Glucose-Capillary: 107 mg/dL — ABNORMAL HIGH (ref 70–99)
Glucose-Capillary: 128 mg/dL — ABNORMAL HIGH (ref 70–99)
Glucose-Capillary: 138 mg/dL — ABNORMAL HIGH (ref 70–99)

## 2020-08-20 LAB — CBC
HCT: 30.8 % — ABNORMAL LOW (ref 39.0–52.0)
Hemoglobin: 10 g/dL — ABNORMAL LOW (ref 13.0–17.0)
MCH: 25.6 pg — ABNORMAL LOW (ref 26.0–34.0)
MCHC: 32.5 g/dL (ref 30.0–36.0)
MCV: 78.8 fL — ABNORMAL LOW (ref 80.0–100.0)
Platelets: 390 10*3/uL (ref 150–400)
RBC: 3.91 MIL/uL — ABNORMAL LOW (ref 4.22–5.81)
RDW: 14.7 % (ref 11.5–15.5)
WBC: 18.5 10*3/uL — ABNORMAL HIGH (ref 4.0–10.5)
nRBC: 0 % (ref 0.0–0.2)

## 2020-08-20 LAB — HEPARIN LEVEL (UNFRACTIONATED)
Heparin Unfractionated: 0.23 IU/mL — ABNORMAL LOW (ref 0.30–0.70)
Heparin Unfractionated: 0.2 IU/mL — ABNORMAL LOW (ref 0.30–0.70)
Heparin Unfractionated: 0.29 IU/mL — ABNORMAL LOW (ref 0.30–0.70)

## 2020-08-20 LAB — BASIC METABOLIC PANEL
Anion gap: 7 (ref 5–15)
BUN: 23 mg/dL (ref 8–23)
CO2: 24 mmol/L (ref 22–32)
Calcium: 8 mg/dL — ABNORMAL LOW (ref 8.9–10.3)
Chloride: 105 mmol/L (ref 98–111)
Creatinine, Ser: 1.64 mg/dL — ABNORMAL HIGH (ref 0.61–1.24)
GFR, Estimated: 46 mL/min — ABNORMAL LOW (ref >60)
Glucose, Bld: 131 mg/dL — ABNORMAL HIGH (ref 70–99)
Potassium: 3.2 mmol/L — ABNORMAL LOW (ref 3.5–5.1)
Sodium: 136 mmol/L (ref 135–145)

## 2020-08-20 LAB — SURGICAL PATHOLOGY

## 2020-08-20 LAB — MAGNESIUM: Magnesium: 1.8 mg/dL (ref 1.7–2.4)

## 2020-08-20 MED ORDER — HEPARIN (PORCINE) 25000 UT/250ML-% IV SOLN
1650.0000 [IU]/h | INTRAVENOUS | Status: DC
  Administered 2020-08-20: 15:00:00 1300 [IU]/h via INTRAVENOUS
  Administered 2020-08-20: 02:00:00 1150 [IU]/h via INTRAVENOUS
  Administered 2020-08-21: 12:00:00 1400 [IU]/h via INTRAVENOUS
  Administered 2020-08-22: 23:00:00 1500 [IU]/h via INTRAVENOUS
  Filled 2020-08-20 (×3): qty 250

## 2020-08-20 MED ORDER — MAGNESIUM SULFATE 2 GM/50ML IV SOLN
2.0000 g | Freq: Once | INTRAVENOUS | Status: AC
  Administered 2020-08-20: 14:00:00 2 g via INTRAVENOUS
  Filled 2020-08-20: qty 50

## 2020-08-20 MED ORDER — POTASSIUM CHLORIDE CRYS ER 20 MEQ PO TBCR
40.0000 meq | EXTENDED_RELEASE_TABLET | Freq: Once | ORAL | Status: AC
  Administered 2020-08-20: 12:00:00 40 meq via ORAL
  Filled 2020-08-20: qty 2

## 2020-08-20 NOTE — Consult Note (Signed)
ANTICOAGULATION CONSULT NOTE - Consult  Pharmacy Consult for Heparin gtt Indication: lower extremity recanullization (angioplasty Left lower limb)  No Known Allergies  Patient Measurements: Height: 6' (182.9 cm) Weight: 86.2 kg (190 lb 0.6 oz) IBW/kg (Calculated) : 77.6 Heparin Dosing Weight: 86.2kg  Vital Signs: Temp: 97.5 F (36.4 C) (12/15 0025) Temp Source: Oral (12/15 0025) BP: 108/67 (12/15 0025) Pulse Rate: 76 (12/15 0025)  Labs: Recent Labs    08/17/20 0312 08/18/20 0412 08/19/20 0712 08/20/20 0109  HGB 11.9* 11.8*  --  10.0*  HCT 37.2* 37.6*  --  30.8*  PLT 393 404*  --  390  HEPARINUNFRC  --   --   --  0.23*  CREATININE 1.80* 1.58* 1.50* 1.64*    Estimated Creatinine Clearance: 48 mL/min (A) (by C-G formula based on SCr of 1.64 mg/dL (H)).   Medications:    Assessment: Heparin Dosing Weight: 86.2kg. H/H 12.9/40.2>11.8/37.6; Plts 445>404. No pertinent drug allergies or interacting medications at present.  Consulted to resume heparin 2hrs after sheath removal. Patient received 5000 units of heparin during procedure, so reduced to half-dose for first bolus. Infusion to start at ~12 u/kg/hr  12/15 0109 HL 0.23, subtherapeutic  Goal of Therapy:  Heparin level 0.3-0.5 units/ml Monitor platelets by anticoagulation protocol: Yes   Plan:  Increase heparin infusion rate to 1150 units/hr Check anti-Xa level in 6 hours and daily while on heparin Continue to monitor H&H and platelets  [name], PharmD, [facility] 08/20/2020 1:48 AM

## 2020-08-20 NOTE — Progress Notes (Addendum)
Daily Progress Note   Subjective  - 2 Day Post-Op  Patient presents today resting in bed comfortably status post 2 days left partial fourth and fifth ray amputations with debridement of third metatarsal phalangeal joint with removal of all nonviable and necrotic tissue.  Patient states he has minimal pain at this time to the amputation site.  Patient had revascularization procedure performed with Dr. Gilda Crease yesterday.  Objective Vitals:   08/20/20 0025 08/20/20 0405 08/20/20 0755 08/20/20 1148  BP: 108/67 125/75 131/74 (!) 146/77  Pulse: 76 70 68 67  Resp: 16 19 18 16   Temp: (!) 97.5 F (36.4 C) (!) 97.5 F (36.4 C) 97.6 F (36.4 C) 97.8 F (36.6 C)  TempSrc: Oral Oral Oral Oral  SpO2: 99% 99% 100% 98%  Weight:      Height:        Physical Exam: Open wound with exposed 3rd metatarsal.  3rd toe a little dusky. S/P 4th and 5th ray amputation.  Further necrosis is noted around the tissue margin at the amputation site and third toe appears to be necrosing further distally.  Amputation sites that appear to be stable with no gross signs of visual infection present.       Laboratory CBC    Component Value Date/Time   WBC 18.5 (H) 08/20/2020 0109   HGB 10.0 (L) 08/20/2020 0109   HCT 30.8 (L) 08/20/2020 0109   PLT 390 08/20/2020 0109    BMET    Component Value Date/Time   NA 136 08/20/2020 0109   K 3.2 (L) 08/20/2020 0109   CL 105 08/20/2020 0109   CO2 24 08/20/2020 0109   GLUCOSE 131 (H) 08/20/2020 0109   BUN 23 08/20/2020 0109   CREATININE 1.64 (H) 08/20/2020 0109   CALCIUM 8.0 (L) 08/20/2020 0109   GFRNONAA 46 (L) 08/20/2020 0109    Assessment/Planning: Left forefoot gangrene PVD Diabetes type 2 polyneuropathy  -Patient seen and examined today. -Appears as though there is worsening necrosis to the third toe as well as plantar flap. -Discussed with patient and patient's son that there is a little to no chance that transmetatarsal amputation will heal due to  the amount of necrosis present to the tissues as well as nonviable skin to be able to cover the amputation site.  Patient and son still to consider options at this time. -The most definitive procedure would be a more proximal amputation.  Discussed this with patient patient's son in detail.  Patient is still hesitant to have this done as he thought that his blood flow was improved after the angiogram.  Discussed with patient that the patient only has one blood vessel flowing down to the foot and the plantar tissues have no blood flow at all because the PT is occluded at the midcalf.  Discussed also previously that during the previous partial ray amputations there was no blood at all that occurred with the amputation even without wearing a tourniquet.  Discussed that it is likely futile to perform a transmetatarsal amputation as the success rate will be limited to none and there is not enough viable tissue to close the amputation site. -Redressed with Betadine soaked gauze to the wound followed by ABD, Kerlix, Ace wrap with no compression. -Will await to hear what the patient family decides to do.  Would like to allow the tissues to demarcate further at this time prior to performing any further amputation on the foot but again believe patient will ultimately need more proximal amputation.  Rosetta Posner, DPM  08/20/2020, 1:00 PM

## 2020-08-20 NOTE — Progress Notes (Signed)
PHARMACY CONSULT NOTE  Pharmacy Consult for Electrolyte Monitoring and Replacement   Recent Labs: Potassium (mmol/L)  Date Value  08/20/2020 3.2 (L)   Calcium (mg/dL)  Date Value  35/46/5681 8.0 (L)   Albumin (g/dL)  Date Value  27/51/7001 2.5 (L)   Sodium (mmol/L)  Date Value  08/20/2020 136   Corrected Ca: 9.2 mg/dL Add-On magnesium: 1.8 mg/dL  Assessment: 68 y.o.malewith medical history significant forPVD with left foot gangrene (s/p angioplasty left dorsalis pedis 08/05/2020), DM, CKD stage III, HTN, HLD, and tobacco use who presents with gas gangrene of the left fourth and fifth toes and peripheral vascular disease with limb threatening ischemia.  MIVF: 0.9% NaCl at 75 mL/hr  Goal of Therapy:  Electrolytes WNL  Plan:   40 mEq oral KCl x 1  2 grams IV magnesium sulfate x 1  Re-check electrolytes in am  Lowella Bandy ,PharmD Clinical Pharmacist 08/20/2020 10:17 AM

## 2020-08-20 NOTE — Progress Notes (Addendum)
[facility]  - [place] at [facility]   PATIENT NAME: William Melton    MR#:  [id number]  DATE OF BIRTH:  07-24-1951  SUBJECTIVE:   Denies any complaints. Patient is anxious about anymore surgery. He is not wanting BKA. REVIEW OF SYSTEMS:   Review of Systems  Constitutional: Negative for chills, fever and weight loss.  HENT: Negative for ear discharge, ear pain and nosebleeds.   Eyes: Negative for blurred vision, pain and discharge.  Respiratory: Negative for sputum production, shortness of breath, wheezing and stridor.   Cardiovascular: Negative for chest pain, palpitations, orthopnea and PND.  Gastrointestinal: Negative for abdominal pain, diarrhea, nausea and vomiting.  Genitourinary: Negative for frequency and urgency.  Musculoskeletal: Negative for back pain and joint pain.  Neurological: Negative for sensory change, speech change, focal weakness and weakness.  Psychiatric/Behavioral: Negative for depression and hallucinations. The patient is not nervous/anxious.    Tolerating Diet: yes Tolerating PT:   DRUG ALLERGIES:  No Known Allergies  VITALS:  Blood pressure (!) 146/77, pulse 67, temperature 97.8 F (36.6 C), temperature source Oral, resp. rate 16, height 6' (1.829 m), weight 86.2 kg, SpO2 98 %.  PHYSICAL EXAMINATION:   Physical Exam  GENERAL:  69 y.o.-year-old patient lying in the bed with no acute distress.  LUNGS: Normal breath sounds bilaterally, no wheezing, rales, rhonchi. No use of accessory muscles of respiration.  CARDIOVASCULAR: S1, S2 normal. No murmurs, rubs, or gallops.  ABDOMEN: Soft, nontender, nondistended. Bowel sounds present. No organomegaly or mass.  EXTREMITIES: from 08/20/2020    NEUROLOGIC: Cranial nerves II through XII are intact. No focal Motor or sensory deficits b/l.   PSYCHIATRIC:  patient is alert and oriented x 3.  SKIN: No obvious rash, lesion, or ulcer.   LABORATORY PANEL:  CBC Recent Labs  Lab 08/20/20 0109   WBC 18.5*  HGB 10.0*  HCT 30.8*  PLT 390    Chemistries  Recent Labs  Lab 08/16/20 1721 08/17/20 0312 08/20/20 0109  NA 137   < > 136  K 3.0*   < > 3.2*  CL 99   < > 105  CO2 24   < > 24  GLUCOSE 81   < > 131*  BUN 31*   < > 23  CREATININE 1.81*   < > 1.64*  CALCIUM 9.1   < > 8.0*  MG  --   --  1.8  AST 16  --   --   ALT 11  --   --   ALKPHOS 72  --   --   BILITOT 1.3*  --   --    < > = values in this interval not displayed.   Cardiac Enzymes No results for input(s): TROPONINI in the last 168 hours. RADIOLOGY:  PERIPHERAL VASCULAR CATHETERIZATION  Result Date: 08/19/2020 See Op Note  DG Foot 2 Views Left  Result Date: 08/18/2020 CLINICAL DATA:  Postop left foot surgery. EXAM: LEFT FOOT - 2 VIEW COMPARISON:  Preoperative radiograph 08/16/2020 FINDINGS: Transmetatarsal amputation of the fourth and fifth rays. Overlying dressing in place in the operative bed. Decreased density involving the lateral aspect of the third metatarsal head/neck may be related to superimposed soft tissue changes, postsurgical change, or less likely osteomyelitis. Chronic fracture of the great toe proximal phalanx unchanged. IMPRESSION: 1. Interval transmetatarsal amputation of the fourth and fifth rays. 2. Decreased density involving the lateral aspect of the third metatarsal head/neck may be related to superimposed soft tissue changes from recent  surgery, postsurgical change, or less likely osteomyelitis. Electronically Signed   By: Narda Rutherford M.D.   On: 08/18/2020 19:24   ASSESSMENT AND PLAN:  William Melton is a 69 y.o. 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 - 12/13--Taken to the  operating room  for amputation by podiatry by Dr. Excell Seltzer.  - 12/14-- s/p  angiogram today with Dr. Gilda Crease vascular surgery PTA of left distal anterior tibial and dorsalis pedis artery   Aggressive antibiotics changed over to Unasyn today. --12/15-- patient's left surgical first appears dusky looking with nonviable tissue around. Podiatry has discussed with patient and son regarding possible consideration of BKA. Patient is not keen on getting it done.  Acute kidney injury on chronic kidney disease stage IIIa.   -With IV fluids as able to get his creatinine down from 1.81 down to 1.50.   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   PAD/Diabetic foot -Patient on low-dose glargine insulin 20 units. -  Hopefully can go back on higher dose insulin once eating 3 meals a day.  Essential hypertension - on Norvasc  Hyperlipidemia unspecified - on pravastatin  Tobacco abuse on nicotine patch    Procedures: left fourth and fifth toes amputation, status post angiogram left lower extremity Family communication : son William Melton Consults : CODE STATUS: full DVT Prophylaxis :SCD in anticipation for more surgery  Status is: Inpatient  Remains inpatient appropriate because:Inpatient level of care appropriate due to severity of illness   Dispo: The patient is from: Home              Anticipated d/c is to: Home              Anticipated d/c date is: 3 days              Patient currently is not medically stable to d/c. patient currently getting treated for infected diabetic foot. He may require amputation. Patient is not keen on it. Currently continue current care. Follow podiatry recommendations.       TOTAL TIME TAKING CARE OF THIS PATIENT: 25 minutes.  >50% time spent on counselling and coordination of care  Note: This dictation was prepared with Dragon dictation along with smaller phrase technology. Any transcriptional errors that result from this  process are unintentional.  Enedina Finner M.D    [facility]   CC: Primary care physician; Altamease Oiler, FNPPatient ID: William Melton, male   DOB: 1951/03/19, 69 y.o.   MRN: [id number]

## 2020-08-20 NOTE — Consult Note (Signed)
ANTICOAGULATION CONSULT NOTE  Pharmacy Consult for Heparin  Indication: lower extremity recanullization (angioplasty Left lower limb)  Patient Measurements: Height: 6' (182.9 cm) Weight: 86.2 kg (190 lb 0.6 oz) IBW/kg (Calculated) : 77.6  Vital Signs: Temp: 97.6 F (36.4 C) (12/15 0755) Temp Source: Oral (12/15 0755) BP: 131/74 (12/15 0755) Pulse Rate: 68 (12/15 0755)  Labs: Recent Labs    08/18/20 0412 08/19/20 0712 08/20/20 0109 08/20/20 0815  HGB 11.8*  --  10.0*  --   HCT 37.6*  --  30.8*  --   PLT 404*  --  390  --   HEPARINUNFRC  --   --  0.23* 0.20*  CREATININE 1.58* 1.50* 1.64*  --     Estimated Creatinine Clearance: 48 mL/min (A) (by C-G formula based on SCr of 1.64 mg/dL (H)).   Assessment: 68 y.o. male with medical history significant for PVD with left foot gangrene (s/p angioplasty left dorsalis pedis 08/05/2020), IDT2DM, CKD stage III, HTN, HLD, and tobacco use who presents with gas gangrene of the left fourth and fifth toes and peripheral vascular disease with limb threatening ischemia.  Consulted 12/14 to start heparin 2hrs after sheath removal s/p angiogram.  H&H, platelets trending down, no reported bleeding  Heparin Course: 12/14 initiation: 2500 unit bolus, then 1050 units/hr 12/15 0109 HL 0.23: increased to 1150 units/hr 12/15 0815 HL 0.20: increased to 1300 units/hr  Goal of Therapy:  Heparin level 0.3-0.5 units/ml Monitor platelets by anticoagulation protocol: Yes   Plan:   Increase heparin infusion rate to 1300 units/hr  Re-check anti-Xa level in 6 hours and daily while on heparin  Continue to monitor H&H and platelets  Burnis Medin, PharmD 08/20/2020 10:02 AM

## 2020-08-20 NOTE — Progress Notes (Signed)
I had a discussion with the patient and the son today.  After consulting with Dr. Excell Seltzer I believe it is best to hold on transmetatarsal amputation tomorrow.  With the worsening discoloration of this third toe and tissue to the surgical site we are still quite concerned that this may not heal.  The family and patient want to try every option at limb salvage understandably.  Will reevaluate tomorrow.

## 2020-08-20 NOTE — Evaluation (Signed)
Physical Therapy Evaluation Patient Details Name: William Melton MRN: [id number] DOB: July 07, 1951 Today's Date: 08/20/2020   History of Present Illness  Pt is a 69 yo male with a PMH that includes CKD, DM, HTN, and PVD.  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    Clinical Impression  Pt was pleasant and motivated to participate during the session but was quite limited functionally by weakness and instability.  Pt required physical assistance with bed mobility, transfers, and gait and required heavy multi-modal cuing to ensure LLE WB compliance.  Pt is at a very high risk for falls and would not be safe to return to his prior living situation at this time.  Pt will benefit from PT services in a SNF setting upon discharge to safely address deficits listed in patient problem list for decreased caregiver assistance and eventual return to PLOF.      Follow Up Recommendations SNF;Supervision for mobility/OOB    Equipment Recommendations  Rolling walker with 5" wheels;3in1 (PT)    Recommendations for Other Services       Precautions / Restrictions Precautions Precautions: Fall Restrictions Weight Bearing Restrictions: Yes LLE Weight Bearing: Non weight bearing      Mobility  Bed Mobility Overal bed mobility: Needs Assistance Bed Mobility: Supine to Sit;Sit to Supine     Supine to sit: Min assist Sit to supine: Supervision   General bed mobility comments: Min A with sup to sit and extra time and effort with sit to sup with min verbal cues for sequencing    Transfers Overall transfer level: Needs assistance Equipment used: Rolling walker (2 wheeled) Transfers: Sit to/from Stand Sit to Stand: Mod assist;Min assist         General transfer comment: Min-mod A to stand and for stability upon initial  stand progressing to CGA in standing once stabilized; mod to max multi-modal cues for sequencing for WB compliance  Ambulation/Gait Ambulation/Gait assistance: Min assist Gait Distance (Feet): 2 Feet x 2 Assistive device: Rolling walker (2 wheeled)   Gait velocity: decreased   General Gait Details: Hop-to pattern with mod to max multi-modal cues for sequencing for WB compliance and min A for stability; pt able to maintain NWB status to LLE but only with extensive cuing for sequencing  Stairs            Wheelchair Mobility    Modified Rankin (Stroke Patients Only)       Balance Overall balance assessment: Needs assistance   Sitting balance-Leahy Scale: Good     Standing balance support: Bilateral upper extremity supported;During functional activity Standing balance-Leahy Scale: Poor Standing balance comment: Physical assist required for stability during standing activities                             Pertinent Vitals/Pain Pain Assessment: 0-10 Pain Score: 5  Pain Location: L foot Pain Descriptors / Indicators: Aching;Sore Pain Intervention(s): Premedicated before session;Monitored during session    Home Living Family/patient expects to be discharged to:: Private residence Living Arrangements: Alone Available Help at Discharge: Family;Available PRN/intermittently Type of Home: House Home Access: Ramped entrance     Home Layout: One level Home Equipment: Cane - single point      Prior Function Level of Independence: Independent with assistive device(s)  Comments: Mod Ind amb community distances with a SPC, 5-6 falls in the last year secondary to LOB, Ind with ADLs     Hand Dominance        Extremity/Trunk Assessment   Upper Extremity Assessment Upper Extremity Assessment: Generalized weakness    Lower Extremity Assessment Lower Extremity Assessment: Generalized weakness       Communication   Communication: No difficulties   Cognition Arousal/Alertness: Awake/alert Behavior During Therapy: WFL for tasks assessed/performed Overall Cognitive Status: Within Functional Limits for tasks assessed                                        General Comments      Exercises Total Joint Exercises Ankle Circles/Pumps: AROM;Both;10 reps Quad Sets: Strengthening;Both;10 reps Gluteal Sets: Strengthening;Both;10 reps Hip ABduction/ADduction: Strengthening;Both;10 reps Straight Leg Raises: Strengthening;Both;10 reps Long Arc Quad: Strengthening;Both;5 reps;10 reps Knee Flexion: Strengthening;Both;10 reps;5 reps Other Exercises Other Exercises: HEP education for BLE APs, QS, and GS x 10 each every 1-2 hours daily   Assessment/Plan    PT Assessment Patient needs continued PT services  PT Problem List Decreased strength;Decreased activity tolerance;Decreased balance;Decreased mobility;Decreased knowledge of precautions;Decreased knowledge of use of DME;Pain       PT Treatment Interventions DME instruction;Gait training;Functional mobility training;Therapeutic activities;Therapeutic exercise;Balance training;Patient/family education    PT Goals (Current goals can be found in the Care Plan section)  Acute Rehab PT Goals Patient Stated Goal: To get home and be independent PT Goal Formulation: With patient Time For Goal Achievement: 09/02/20 Potential to Achieve Goals: Good    Frequency 7X/week   Barriers to discharge Inaccessible home environment;Decreased caregiver support      Co-evaluation               AM-PAC PT "6 Clicks" Mobility  Outcome Measure Help needed turning from your back to your side while in a flat bed without using bedrails?: A Little Help needed moving from lying on your back to sitting on the side of a flat bed without using bedrails?: A Little Help needed moving to and from a bed to a chair (including a wheelchair)?: A Lot Help needed standing up from a chair using your  arms (e.g., wheelchair or bedside chair)?: A Lot Help needed to walk in hospital room?: Total Help needed climbing 3-5 steps with a railing? : Total 6 Click Score: 12    End of Session Equipment Utilized During Treatment: Gait belt Activity Tolerance: Patient tolerated treatment well Patient left: in bed;with call bell/phone within reach;with bed alarm set Nurse Communication: Mobility status;Weight bearing status PT Visit Diagnosis: Unsteadiness on feet (R26.81);History of falling (Z91.81);Repeated falls (R29.6);Difficulty in walking, not elsewhere classified (R26.2);Muscle weakness (generalized) (M62.81);Pain Pain - Right/Left: Left Pain - part of body: Ankle and joints of foot    Time: 0240-9735 PT Time Calculation (min) (ACUTE ONLY): 44 min   Charges:   PT Evaluation $PT Eval Moderate Complexity: 1 Mod PT Treatments $Gait Training: 8-22 mins $Therapeutic Exercise: 8-22 mins        D. Elly Modena PT, DPT 08/20/20, 1:53 PM

## 2020-08-20 NOTE — Consult Note (Addendum)
ANTICOAGULATION CONSULT NOTE  Pharmacy Consult for Heparin  Indication: lower extremity recanullization (angioplasty Left lower limb)  Patient Measurements: Height: 6' (182.9 cm) Weight: 86.2 kg (190 lb 0.6 oz) IBW/kg (Calculated) : 77.6  Vital Signs: Temp: 97.8 F (36.6 C) (12/15 1148) Temp Source: Oral (12/15 1148) BP: 146/77 (12/15 1148) Pulse Rate: 67 (12/15 1148)  Labs: Recent Labs    08/18/20 0412 08/19/20 0712 08/20/20 0109 08/20/20 0815 08/20/20 1827  HGB 11.8*  --  10.0*  --   --   HCT 37.6*  --  30.8*  --   --   PLT 404*  --  390  --   --   HEPARINUNFRC  --   --  0.23* 0.20* 0.29*  CREATININE 1.58* 1.50* 1.64*  --   --     Estimated Creatinine Clearance: 48 mL/min (A) (by C-G formula based on SCr of 1.64 mg/dL (H)).   Assessment: 69 y.o. male with medical history significant for PVD with left foot gangrene (s/p angioplasty left dorsalis pedis 08/05/2020), IDT2DM, CKD stage III, HTN, HLD, and tobacco use who presents with gas gangrene of the left fourth and fifth toes and peripheral vascular disease with limb threatening ischemia.  Consulted 12/14 to start heparin 2hrs after sheath removal s/p angiogram.  H&H, platelets trending down, no reported bleeding  Heparin Course: 12/14 initiation: 2500 unit bolus, then 1050 units/hr 12/15 0109 HL 0.23: increased to 1150 units/hr 12/15 0815 HL 0.20: increased to 1300 units/hr 12/15 1827 HL 0.29 increased to 1400 units/hr   Goal of Therapy:  Heparin level 0.3-0.5 units/ml Monitor platelets by anticoagulation protocol: Yes   Plan:   Increase heparin infusion rate to 1400 units/hr  Re-check anti-Xa level in 6 hours and daily while on heparin  Continue to monitor H&H and platelets  Paschal Dopp, PharmD 08/20/2020 7:14 PM

## 2020-08-21 ENCOUNTER — Encounter
Admission: EM | Disposition: A | Discharge status: Skilled Nursing Facility | Payer: Self-pay | Source: Home / Self Care | Attending: Internal Medicine

## 2020-08-21 LAB — CBC
HCT: 32.7 % — ABNORMAL LOW (ref 39.0–52.0)
Hemoglobin: 10.6 g/dL — ABNORMAL LOW (ref 13.0–17.0)
MCH: 25.9 pg — ABNORMAL LOW (ref 26.0–34.0)
MCHC: 32.4 g/dL (ref 30.0–36.0)
MCV: 79.8 fL — ABNORMAL LOW (ref 80.0–100.0)
Platelets: 391 10*3/uL (ref 150–400)
RBC: 4.1 MIL/uL — ABNORMAL LOW (ref 4.22–5.81)
RDW: 15.1 % (ref 11.5–15.5)
WBC: 11.1 10*3/uL — ABNORMAL HIGH (ref 4.0–10.5)
nRBC: 0 % (ref 0.0–0.2)

## 2020-08-21 LAB — BASIC METABOLIC PANEL
Anion gap: 7 (ref 5–15)
BUN: 22 mg/dL (ref 8–23)
CO2: 24 mmol/L (ref 22–32)
Calcium: 8.1 mg/dL — ABNORMAL LOW (ref 8.9–10.3)
Chloride: 106 mmol/L (ref 98–111)
Creatinine, Ser: 1.61 mg/dL — ABNORMAL HIGH (ref 0.61–1.24)
GFR, Estimated: 47 mL/min — ABNORMAL LOW (ref >60)
Glucose, Bld: 124 mg/dL — ABNORMAL HIGH (ref 70–99)
Potassium: 3.4 mmol/L — ABNORMAL LOW (ref 3.5–5.1)
Sodium: 137 mmol/L (ref 135–145)

## 2020-08-21 LAB — CULTURE, BLOOD (ROUTINE X 2)
Culture: NO GROWTH
Culture: NO GROWTH
Special Requests: ADEQUATE
Special Requests: ADEQUATE

## 2020-08-21 LAB — GLUCOSE, CAPILLARY
Glucose-Capillary: 96 mg/dL (ref 70–99)
Glucose-Capillary: 119 mg/dL — ABNORMAL HIGH (ref 70–99)
Glucose-Capillary: 126 mg/dL — ABNORMAL HIGH (ref 70–99)
Glucose-Capillary: 118 mg/dL — ABNORMAL HIGH (ref 70–99)

## 2020-08-21 LAB — SURGICAL PCR SCREEN
MRSA, PCR: NEGATIVE
Staphylococcus aureus: NEGATIVE

## 2020-08-21 LAB — HEPARIN LEVEL (UNFRACTIONATED)
Heparin Unfractionated: 0.36 IU/mL (ref 0.30–0.70)
Heparin Unfractionated: 0.39 IU/mL (ref 0.30–0.70)

## 2020-08-21 LAB — MAGNESIUM: Magnesium: 2.1 mg/dL (ref 1.7–2.4)

## 2020-08-21 SURGERY — AMPUTATION, FOOT, TRANSMETATARSAL
Anesthesia: Choice | Site: Toe | Laterality: Left

## 2020-08-21 MED ORDER — METOPROLOL SUCCINATE ER 25 MG PO TB24
25.0000 mg | ORAL_TABLET | Freq: Every day | ORAL | Status: DC
Start: 2020-08-21 — End: 2020-08-25
  Administered 2020-08-21 – 2020-08-25 (×5): 25 mg via ORAL
  Filled 2020-08-21 (×4): qty 1

## 2020-08-21 MED ORDER — CHLORHEXIDINE GLUCONATE 4 % EX LIQD: 60.0000 mL | Freq: Once | CUTANEOUS | Status: DC

## 2020-08-21 MED ORDER — POVIDONE-IODINE 10 % EX SWAB: 2.0000 "application " | Freq: Once | CUTANEOUS | Status: DC

## 2020-08-21 MED ORDER — POTASSIUM CHLORIDE CRYS ER 20 MEQ PO TBCR
40.0000 meq | EXTENDED_RELEASE_TABLET | Freq: Once | ORAL | Status: AC
  Administered 2020-08-21: 09:00:00 40 meq via ORAL
  Filled 2020-08-21: qty 2

## 2020-08-21 MED ORDER — METRONIDAZOLE 500 MG PO TABS
500.0000 mg | ORAL_TABLET | Freq: Three times a day (TID) | ORAL | Status: DC
  Administered 2020-08-21 – 2020-08-25 (×10): 500 mg via ORAL
  Filled 2020-08-21 (×15): qty 1

## 2020-08-21 MED ORDER — SODIUM CHLORIDE 0.9 % IV SOLN
2.0000 g | INTRAVENOUS | Status: DC
  Administered 2020-08-21 – 2020-08-22 (×2): 2 g via INTRAVENOUS
  Filled 2020-08-21 (×2): qty 2

## 2020-08-21 NOTE — Progress Notes (Addendum)
[facility]  - [place] [facility]   PATIENT NAME: William Melton    MR#:  [id number]  DATE OF BIRTH:  05/20/1951  SUBJECTIVE:   Denies any complaints. Ate good BF. Received IV prn labetalol per RN for elevated blood pressure. Patient denies any pain however appears somewhat anxious REVIEW OF SYSTEMS:   Review of Systems  Constitutional: Negative for chills, fever and weight loss.  HENT: Negative for ear discharge, ear pain and nosebleeds.   Eyes: Negative for blurred vision, pain and discharge.  Respiratory: Negative for sputum production, shortness of breath, wheezing and stridor.   Cardiovascular: Negative for chest pain, palpitations, orthopnea and PND.  Gastrointestinal: Negative for abdominal pain, diarrhea, nausea and vomiting.  Genitourinary: Negative for frequency and urgency.  Musculoskeletal: Negative for back pain and joint pain.  Neurological: Negative for sensory change, speech change, focal weakness and weakness.  Psychiatric/Behavioral: Negative for depression and hallucinations. The patient is nervous/anxious.    Tolerating Diet: yes Tolerating PT:   DRUG ALLERGIES:  No Known Allergies  VITALS:  Blood pressure (!) 152/92, pulse 64, temperature 97.7 F (36.5 C), temperature source Oral, resp. rate 16, height 6' (1.829 m), weight 86.2 kg, SpO2 99 %.  PHYSICAL EXAMINATION:   Physical Exam  GENERAL:  69 y.o.-year-old patient lying in the bed with no acute distress.  LUNGS: Normal breath sounds bilaterally, no wheezing, rales, rhonchi. No use of accessory muscles of respiration.  CARDIOVASCULAR: S1, S2 normal. No murmurs, rubs, or gallops.  ABDOMEN: Soft, nontender, nondistended. Bowel sounds present. No organomegaly or mass.  EXTREMITIES: from 08/20/2020   PSYCHIATRIC:  patient is alert and oriented x 3.  SKIN: as above  LABORATORY PANEL:  CBC Recent Labs  Lab 08/21/20 0200  WBC 11.1*  HGB 10.6*  HCT 32.7*  PLT 391    Chemistries   Recent Labs  Lab 08/16/20 1721 08/17/20 0312 08/21/20 0200  NA 137   < > 137  K 3.0*   < > 3.4*  CL 99   < > 106  CO2 24   < > 24  GLUCOSE 81   < > 124*  BUN 31*   < > 22  CREATININE 1.81*   < > 1.61*  CALCIUM 9.1   < > 8.1*  MG  --    < > 2.1  AST 16  --   --   ALT 11  --   --   ALKPHOS 72  --   --   BILITOT 1.3*  --   --    < > = values in this interval not displayed.   Cardiac Enzymes No results for input(s): TROPONINI in the last 168 hours. RADIOLOGY:  PERIPHERAL VASCULAR CATHETERIZATION  Result Date: 08/19/2020 See Op Note  ASSESSMENT AND PLAN:  William Melton is a 69 y.o. 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 - 12/13--Taken to the operating room  for amputation by podiatry by Dr. Excell Seltzer.  - 12/14-- s/p  angiogram today with Dr. Gilda Crease vascular surgery PTA of left distal anterior tibial and dorsalis pedis artery. Pt is on IV heparin gtt per Dr Eustace Quail recommendations  --Cont IV Unasyn  --12/15-- patient's left surgical first appears  dusky looking with nonviable tissue around. Podiatry has discussed with patient and son regarding possible consideration of BKA. Patient is not keen on getting it done. --12/16-- awaiting Podiatry evaluation of foot and further recs  Acute kidney injury on chronic kidney disease stage IIIa.   -With IV fluids as able to get his creatinine down from 1.81 down to 1.50.   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   PAD/Diabetic foot -Patient on low-dose glargine insulin 20 units. -  Hopefully can go back on higher dose insulin once eating 3 meals a day.  Essential hypertension - on Norvasc -  will add BB for elevated BP  Hyperlipidemia unspecified - on pravastatin  Tobacco abuse on nicotine patch    Procedures: left fourth and fifth toes amputation, status post angiogram left lower extremity Family communication : son William Melton on 12/15 Consults : CODE STATUS: full DVT Prophylaxis :Heparin gtt  Status is: Inpatient  Remains inpatient appropriate because:Inpatient level of care appropriate due to severity of illness   Dispo: The patient is from: Home              Anticipated d/c is to: Home              Anticipated d/c date is: 3 days              Patient currently is not medically stable to d/c. patient currently getting treated for infected diabetic foot. He may require amputation. Patient is not keen on it. Currently continue current care. Follow podiatry recommendations.   TOTAL TIME TAKING CARE OF THIS PATIENT: 25 minutes.  >50% time spent on counselling and coordination of care  Note: This dictation was prepared with Dragon dictation along with smaller phrase technology. Any transcriptional errors that result from this process are unintentional.  Enedina Finner M.D    [facility]   CC: Primary care physician; Altamease Oiler, FNPPatient ID: William Melton, male   DOB: 12/23/1950, 69 y.o.   MRN: [id number]

## 2020-08-21 NOTE — Progress Notes (Signed)
Daily Progress Note   Subjective  - 2 Days Post-Op  Left foot gangrene.  Objective Vitals:   08/21/20 0426 08/21/20 0803 08/21/20 0857 08/21/20 1228  BP: (!) 149/91 (!) 182/97 (!) 152/92 (!) 152/98  Pulse: 69 74 64 66  Resp: 19 16  16   Temp: 98.2 F (36.8 C)  97.7 F (36.5 C) 97.6 F (36.4 C)  TempSrc: Oral  Oral   SpO2: 100% 99%  99%  Weight:      Height:        Physical Exam: The third toe is necrotic.  There is areas of necrosis in the surrounding wound.  The most medial portion of the foot still has some perfusion with normal skin color.  No active infection at this time.          Laboratory CBC    Component Value Date/Time   WBC 11.1 (H) 08/21/2020 0200   HGB 10.6 (L) 08/21/2020 0200   HCT 32.7 (L) 08/21/2020 0200   PLT 391 08/21/2020 0200    BMET    Component Value Date/Time   NA 137 08/21/2020 0200   K 3.4 (L) 08/21/2020 0200   CL 106 08/21/2020 0200   CO2 24 08/21/2020 0200   GLUCOSE 124 (H) 08/21/2020 0200   BUN 22 08/21/2020 0200   CREATININE 1.61 (H) 08/21/2020 0200   CALCIUM 8.1 (L) 08/21/2020 0200   GFRNONAA 47 (L) 08/21/2020 0200    Assessment/Planning: Status post debridement of fourth and fifth rays left foot with gas gangrene   The patient wants to do everything he can to try for limb salvage at this point.  At this point his only option is transmetatarsal amputation and try to preserve some of the medial skin flap to create a flap to cover the forefoot.  That is the only area of viable skin into the forefoot at this point.  I discussed the strong possibility that this does not heal but he once again wishes to proceed with any and all options that are available at this time.  The risk benefits alternatives and complications associated with the surgery were discussed with the patient full and consent has been given verbally.  08/23/2020 A  08/21/2020, 2:15 PM

## 2020-08-21 NOTE — TOC Initial Note (Signed)
Transition of Care [facility]) - Initial/Assessment Note    Patient Details  Name: William Melton MRN: [id number] Date of Birth: 09/02/1951  Transition of Care [facility] CM/SW Contact:    Chapman Fitch, RN Phone Number: 08/21/2020, 1:15 PM  Clinical Narrative:                 Patient admitted from home with gangrene to the foot.   Partial amputation of foot.   Patient states he lives at home alone.  Adult children live locally for support.   PCP Tiburcio Pea,  Pharmacy [address]  Denies issues obtaining medications.  Stats that he has a cane in the home  PT has worked with patient and recommends SNF.  Patient agreeable.  Fl2 sent for signature Pasrr obtained.  Bed search initiated   Expected Discharge Plan: Skilled Nursing Facility Barriers to Discharge: Continued Medical Work up   Patient Goals and CMS Choice        Expected Discharge Plan and Services Expected Discharge Plan: Skilled Nursing Facility   Discharge Planning Services: CM Consult   Living arrangements for the past 2 months: Single Family Home                                      Prior Living Arrangements/Services Living arrangements for the past 2 months: Single Family Home Lives with:: Self Patient language and need for interpreter reviewed:: Yes        Need for Family Participation in Patient Care: Yes (Comment) Care giver support system in place?: Yes (comment)   Criminal Activity/Legal Involvement Pertinent to Current Situation/Hospitalization: No - Comment as needed  Activities of Daily Living Home Assistive Devices/Equipment: None ADL Screening (condition at time of admission) Patient's cognitive ability adequate to safely complete daily activities?: Yes Is the patient deaf or have difficulty hearing?: No Does the patient have difficulty seeing, even when wearing glasses/contacts?: No Does the patient have difficulty concentrating, remembering, or making decisions?:  No Patient able to express need for assistance with ADLs?: Yes Does the patient have difficulty dressing or bathing?: No Independently performs ADLs?: Yes (appropriate for developmental age) Does the patient have difficulty walking or climbing stairs?: Yes Weakness of Legs: None Weakness of Arms/Hands: None  Permission Sought/Granted                  Emotional Assessment       Orientation: : Oriented to Self,Oriented to Place,Oriented to  Time,Oriented to Situation   Psych Involvement: No (comment)  Admission diagnosis:  Gas gangrene of foot (HCC) [A48.0] Chronic kidney disease, unspecified CKD stage [N18.9] Gangrene of extremity (HCC) [I96] Patient Active Problem List   Diagnosis Date Noted  . Chronic kidney disease   . Acute kidney injury superimposed on CKD (HCC)   . PVD (peripheral vascular disease) (HCC)   . Hyperlipidemia   . Tobacco abuse   . 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 Hypokalemia 08/16/2020  . CKD stage 3 due to type 2 diabetes mellitus (HCC) 08/16/2020  . Hyperlipidemia associated with type 2 diabetes mellitus (HCC) 08/16/2020  . Atherosclerosis of native arteries of the extremities with gangrene (HCC) 06/20/2020  . Essential hypertension 06/20/2020  . Diabetes (HCC) 06/20/2020  . Pressure ulcer of toe of left foot 05/13/2020  . Lower limb pain, inferior, right 04/10/2020  . Edema of lower extremity 04/10/2020  . Ulcer of foot (  HCC) 04/10/2020   PCP:  Altamease Oiler, FNP Pharmacy:   [facility] - [place], [place] - [address] [place] [address] Phone: [phone] Fax: [phone]     Social Determinants of Health (SDOH) Interventions    Readmission Risk Interventions No flowsheet data found.

## 2020-08-21 NOTE — NC FL2 (Signed)
[name] MEDICAID FL2 LEVEL OF CARE SCREENING TOOL     IDENTIFICATION  Patient Name: William Melton Birthdate: [birth year] Sex: male Admission Date (Current Location): 08/16/2020  [facility] and [place] Number:  Chiropodist and Address:         Provider Number: [id number]  Attending Physician Name and Address:  Enedina Finner, MD  Relative Name and Phone Number:       Current Level of Care: Hospital Recommended Level of Care: Skilled Nursing Facility Prior Approval Number:    Date Approved/Denied:   PASRR Number: [id number]  Discharge Plan: SNF    Current Diagnoses: Patient Active Problem List   Diagnosis Date Noted  . Chronic kidney disease   . Acute kidney injury superimposed on CKD (HCC)   . PVD (peripheral vascular disease) (HCC)   . Hyperlipidemia   . Tobacco abuse   . 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 Hypokalemia 08/16/2020  . CKD stage 3 due to type 2 diabetes mellitus (HCC) 08/16/2020  . Hyperlipidemia associated with type 2 diabetes mellitus (HCC) 08/16/2020  . Atherosclerosis of native arteries of the extremities with gangrene (HCC) 06/20/2020  . Essential hypertension 06/20/2020  . Diabetes (HCC) 06/20/2020  . Pressure ulcer of toe of left foot 05/13/2020  . Lower limb pain, inferior, right 04/10/2020  . Edema of lower extremity 04/10/2020  . Ulcer of foot (HCC) 04/10/2020    Orientation RESPIRATION BLADDER Height & Weight     Self,Time,Situation,Place  Normal Continent Weight: 86.2 kg Height:  6' (182.9 cm)  BEHAVIORAL SYMPTOMS/MOOD NEUROLOGICAL BOWEL NUTRITION STATUS      Continent Diet (Carb modified)  AMBULATORY STATUS COMMUNICATION OF NEEDS Skin   Limited Assist Verbally Surgical wounds                       Personal Care Assistance Level of Assistance              Functional Limitations Info             SPECIAL CARE FACTORS FREQUENCY  PT (By licensed PT),OT (By licensed OT)                     Contractures Contractures Info: Not present    Additional Factors Info  Code Status,Allergies Code Status Info: Full Allergies Info: NKDA           Current Medications (08/21/2020):  This is the current hospital active medication list Current Facility-Administered Medications  Medication Dose Route Frequency Provider Last Rate Last Admin  . 0.9 %  sodium chloride infusion  250 mL Intravenous PRN Schnier, Latina Craver, MD      . acetaminophen (TYLENOL) tablet 650 mg  650 mg Oral Q6H PRN Rosetta Posner, DPM       Or  . acetaminophen (TYLENOL) suppository 650 mg  650 mg Rectal Q6H PRN Rosetta Posner, DPM      . amLODipine (NORVASC) tablet 10 mg  10 mg Oral Daily Rosetta Posner, DPM   10 mg at 08/21/20 0844  . cefTRIAXone (ROCEPHIN) 2 g in sodium chloride 0.9 % 100 mL IVPB  2 g Intravenous Q24H Enedina Finner, MD 200 mL/hr at 08/21/20 1303 2 g at 08/21/20 1303  . heparin ADULT infusion 100 units/mL (25000 units/250mL sodium chloride 0.45%)  1,400 Units/hr Intravenous Continuous Ronnald Ramp, RPH 14 mL/hr at 08/21/20 1139 1,400 Units/hr at 08/21/20 1139  . HYDROcodone-acetaminophen (NORCO/VICODIN) 5-325 MG per tablet 1-2 tablet  1-2 tablet Oral Q4H PRN Rosetta Posner, DPM   1 tablet at 08/21/20 1145  . HYDROmorphone (DILAUDID) injection 0.5 mg  0.5 mg Intravenous Q4H PRN Rosetta Posner, DPM   0.5 mg at 08/17/20 1809  . insulin aspart (novoLOG) injection 0-9 Units  0-9 Units Subcutaneous TID WC Rosetta Posner, DPM   2 Units at 08/19/20 0846  . insulin glargine (LANTUS) injection 20 Units  20 Units Subcutaneous QHS Rosetta Posner, DPM   20 Units at 08/20/20 2202  . labetalol (NORMODYNE) injection 10 mg  10 mg Intravenous Q4H PRN Rosetta Posner, DPM   10 mg at 08/21/20 0845  . metoprolol succinate (TOPROL-XL) 24 hr tablet 25 mg  25 mg Oral Daily Enedina Finner, MD   25 mg at 08/21/20 1136  . metroNIDAZOLE (FLAGYL) tablet 500 mg  500 mg Oral Q8H Enedina Finner, MD      . nicotine (NICODERM CQ - dosed in  mg/24 hours) patch 21 mg  21 mg Transdermal Daily Rosetta Posner, DPM   21 mg at 08/21/20 0844  . ondansetron (ZOFRAN) tablet 4 mg  4 mg Oral Q6H PRN Rosetta Posner, DPM       Or  . ondansetron (ZOFRAN) injection 4 mg  4 mg Intravenous Q6H PRN Rosetta Posner, DPM      . ondansetron [facility]) injection 4 mg  4 mg Intravenous Q6H PRN Stegmayer, Kimberly A, PA-C      . pravastatin (PRAVACHOL) tablet 20 mg  20 mg Oral q1800 Rosetta Posner, DPM   20 mg at 08/20/20 1752  . senna-docusate (Senokot-S) tablet 1 tablet  1 tablet Oral QHS PRN Rosetta Posner, DPM      . sodium chloride flush (NS) 0.9 % injection 3 mL  3 mL Intravenous Q12H Schnier, Latina Craver, MD   3 mL at 08/20/20 0943  . sodium chloride flush (NS) 0.9 % injection 3 mL  3 mL Intravenous PRN Schnier, Latina Craver, MD         Discharge Medications: Please see discharge summary for a list of discharge medications.  Relevant Imaging Results:  Relevant Lab Results:   Additional Information ss [id number]  Chapman Fitch, RN

## 2020-08-21 NOTE — Progress Notes (Signed)
Physical Therapy Treatment Patient Details Name: William Melton MRN: [id number] DOB: 25-Sep-1950 Today's Date: 08/21/2020    History of Present Illness Pt is a 69 yo male with a PMH that includes CKD, DM, HTN, and PVD.  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    PT Comments    Pt was pleasant and motivated to participate during the session.  Pt required physical assistance to manage his LE's during bed mobility tasks.  Pt was able to stand from an elevated surface with min A and cuing for proper sequencing for WB compliance.  Upon standing pt continued to require occasional min A for stability and was not able to perform any hop-to steps this session despite multiple attempts and cuing for sequencing.  Pt was able to take several shuffling steps at the EOB with cues for sequencing and maintained LLE NWB status throughout.  Pt will benefit from PT services in a SNF setting upon discharge to safely address deficits listed in patient problem list for decreased caregiver assistance and eventual return to PLOF.     Follow Up Recommendations  SNF;Supervision for mobility/OOB     Equipment Recommendations  Rolling walker with 5" wheels;3in1 (PT)    Recommendations for Other Services       Precautions / Restrictions Precautions Precautions: Fall Restrictions Weight Bearing Restrictions: Yes LLE Weight Bearing: Non weight bearing    Mobility  Bed Mobility Overal bed mobility: Needs Assistance Bed Mobility: Supine to Sit;Sit to Supine     Supine to sit: Min assist Sit to supine: Min assist   General bed mobility comments: Min A for BLE management during sup to/from sit  Transfers Overall transfer level: Needs assistance Equipment used: Rolling walker (2 wheeled) Transfers: Sit to/from Stand Sit to Stand:  Min assist;From elevated surface         General transfer comment: Min A to stand from an elevated surface with mod to max multi-modal cues for proper sequencing for LLE NWB compliance  Ambulation/Gait Ambulation/Gait assistance: Min assist Gait Distance (Feet): 1 Feet Assistive device: Rolling walker (2 wheeled) Gait Pattern/deviations: Shuffle Gait velocity: decreased   General Gait Details: Pt unable to perform hop-to step this session but was able to shuffle his RLE left/right at the EOB minimally with L foot held off the floor throughout; max multi-modal cuing provided for sequencing and WB compliance   Stairs             Wheelchair Mobility    Modified Rankin (Stroke Patients Only)       Balance Overall balance assessment: Needs assistance Sitting-balance support: Bilateral upper extremity supported Sitting balance-Leahy Scale: Good     Standing balance support: Bilateral upper extremity supported;During functional activity Standing balance-Leahy Scale: Poor Standing balance comment: Physical assist required for stability during standing activities                            Cognition Arousal/Alertness: Awake/alert Behavior During Therapy: WFL for tasks assessed/performed Overall Cognitive Status: Within Functional Limits for tasks assessed                                        Exercises Total Joint Exercises Ankle Circles/Pumps: AROM;Both;10  reps;5 reps (manual resistance on the RLE) Quad Sets: Strengthening;Both;10 reps;5 reps Gluteal Sets: Strengthening;Both;10 reps;5 reps Hip ABduction/ADduction: Strengthening;Both;10 reps;5 reps (with manual resistance) Straight Leg Raises: Strengthening;Both;10 reps;5 reps Long Arc Quad: Strengthening;Both;10 reps Knee Flexion: Strengthening;Both;10 reps Other Exercises Other Exercises: HEP review for BLE APs, QS, and GS x 10 each every 1-2 hours daily    General Comments         Pertinent Vitals/Pain Pain Assessment: No/denies pain    Home Living                      Prior Function            PT Goals (current goals can now be found in the care plan section) Progress towards PT goals: Progressing toward goals    Frequency    7X/week      PT Plan Current plan remains appropriate    Co-evaluation              AM-PAC PT "6 Clicks" Mobility   Outcome Measure  Help needed turning from your back to your side while in a flat bed without using bedrails?: A Little Help needed moving from lying on your back to sitting on the side of a flat bed without using bedrails?: A Little Help needed moving to and from a bed to a chair (including a wheelchair)?: A Lot Help needed standing up from a chair using your arms (e.g., wheelchair or bedside chair)?: A Little Help needed to walk in hospital room?: Total Help needed climbing 3-5 steps with a railing? : Total 6 Click Score: 13    End of Session Equipment Utilized During Treatment: Gait belt Activity Tolerance: Patient tolerated treatment well Patient left: in bed;with call bell/phone within reach;with bed alarm set Nurse Communication: Mobility status;Weight bearing status PT Visit Diagnosis: Unsteadiness on feet (R26.81);History of falling (Z91.81);Repeated falls (R29.6);Difficulty in walking, not elsewhere classified (R26.2);Muscle weakness (generalized) (M62.81);Pain Pain - Right/Left: Left Pain - part of body: Ankle and joints of foot     Time: 1045-1110 PT Time Calculation (min) (ACUTE ONLY): 25 min  Charges:  $Therapeutic Exercise: 23-37 mins                    D. Scott Aleighya Mcanelly PT, DPT 08/21/20, 1:08 PM

## 2020-08-21 NOTE — Progress Notes (Signed)
PHARMACY CONSULT NOTE  Pharmacy Consult for Electrolyte Monitoring and Replacement   Recent Labs: Potassium (mmol/L)  Date Value  08/21/2020 3.4 (L)   Magnesium (mg/dL)  Date Value  73/41/9379 2.1   Calcium (mg/dL)  Date Value  02/40/9735 8.1 (L)   Albumin (g/dL)  Date Value  32/99/2426 2.5 (L)   Sodium (mmol/L)  Date Value  08/21/2020 137   Corrected Ca: 9.3 mg/dL  Assessment: 68 y.o.malewith medical history significant forPVD with left foot gangrene (s/p angioplasty left dorsalis pedis 08/05/2020), DM, CKD stage III, HTN, HLD, and tobacco use who presents with gas gangrene of the left fourth and fifth toes and peripheral vascular disease with limb threatening ischemia.   Goal of Therapy:  Electrolytes WNL  Plan:   40 mEq oral KCl x 1  Re-check electrolytes in am  Lowella Bandy ,PharmD Clinical Pharmacist 08/21/2020 7:24 AM

## 2020-08-21 NOTE — Consult Note (Signed)
ANTICOAGULATION CONSULT NOTE  Pharmacy Consult for Heparin  Indication: lower extremity recanullization (angioplasty Left lower limb)  Patient Measurements: Height: 6' (182.9 cm) Weight: 86.2 kg (190 lb 0.6 oz) IBW/kg (Calculated) : 77.6  Vital Signs: Temp: 98 F (36.7 C) (12/16 0000) Temp Source: Oral (12/16 0000) BP: 152/87 (12/16 0000) Pulse Rate: 71 (12/16 0000)  Labs: Recent Labs    08/18/20 0412 08/18/20 0412 08/19/20 0712 08/20/20 0109 08/20/20 0815 08/20/20 1827 08/21/20 0200  HGB 11.8*  --   --  10.0*  --   --  10.6*  HCT 37.6*  --   --  30.8*  --   --  32.7*  PLT 404*  --   --  390  --   --  391  HEPARINUNFRC  --    < >  --  0.23* 0.20* 0.29* 0.36  CREATININE 1.58*  --  1.50* 1.64*  --   --  1.61*   < > = values in this interval not displayed.    Estimated Creatinine Clearance: 48.9 mL/min (A) (by C-G formula based on SCr of 1.61 mg/dL (H)).   Assessment: 69 y.o. male with medical history significant for PVD with left foot gangrene (s/p angioplasty left dorsalis pedis 08/05/2020), IDT2DM, CKD stage III, HTN, HLD, and tobacco use who presents with gas gangrene of the left fourth and fifth toes and peripheral vascular disease with limb threatening ischemia.  Consulted 12/14 to start heparin 2hrs after sheath removal s/p angiogram.  H&H, platelets trending down, no reported bleeding  Heparin Course: 12/14 initiation: 2500 unit bolus, then 1050 units/hr 12/15 0109 HL 0.23: increased to 1150 units/hr 12/15 0815 HL 0.20: increased to 1300 units/hr 12/15 1827 HL 0.29 increased to 1400 units/hr  12/16 0200 HL 0.36 , therapeutic X 1   Goal of Therapy:  Heparin level 0.3-0.5 units/ml Monitor platelets by anticoagulation protocol: Yes   Plan:  12/16:  HL @ 0200 = 0.36 Will continue pt on current rate and draw confirmation level in 6 hrs on 12/16 @ 0800.   Tyleigh Mahn D 08/21/2020 2:46 AM

## 2020-08-21 NOTE — Consult Note (Signed)
ANTICOAGULATION CONSULT NOTE  Pharmacy Consult for Heparin  Indication: lower extremity recanullization (angioplasty Left lower limb)  Patient Measurements: Height: 6' (182.9 cm) Weight: 86.2 kg (190 lb 0.6 oz) IBW/kg (Calculated) : 77.6  Vital Signs: Temp: 97.7 F (36.5 C) (12/16 0857) Temp Source: Oral (12/16 0857) BP: 152/92 (12/16 0857) Pulse Rate: 64 (12/16 0857)  Labs: Recent Labs    08/19/20 0712 08/20/20 0109 08/20/20 0815 08/20/20 1827 08/21/20 0200 08/21/20 0820  HGB  --  10.0*  --   --  10.6*  --   HCT  --  30.8*  --   --  32.7*  --   PLT  --  390  --   --  391  --   HEPARINUNFRC  --  0.23*   < > 0.29* 0.36 0.39  CREATININE 1.50* 1.64*  --   --  1.61*  --    < > = values in this interval not displayed.    Estimated Creatinine Clearance: 48.9 mL/min (A) (by C-G formula based on SCr of 1.61 mg/dL (H)).   Assessment: 69 y.o. male with medical history significant for PVD with left foot gangrene (s/p angioplasty left dorsalis pedis 08/05/2020), IDT2DM, CKD stage III, HTN, HLD, and tobacco use who presents with gas gangrene of the left fourth and fifth toes and peripheral vascular disease with limb threatening ischemia.  Consulted 12/14 to start heparin 2hrs after sheath removal s/p angiogram.  H&H, platelets stable, no reported bleeding  Heparin Course: 12/14 initiation: 2500 unit bolus, then 1050 units/hr 12/15 0109 HL 0.23: increased to 1150 units/hr 12/15 0815 HL 0.20: increased to 1300 units/hr 12/15 1827 HL 0.29 increased to 1400 units/hr  12/16 0200 HL 0.36: no change 12/16 0820 HL 0.39: no change  Goal of Therapy:  Heparin level 0.3-0.5 units/ml Monitor platelets by anticoagulation protocol: Yes   Plan:   Anti-Xa level remains therapeutic   Continue heparin infusion rate at1400 units/hr  Re-check anti-Xa level daily while on heparin  Continue to monitor H&H and platelets   Lowella Bandy 08/21/2020 9:22 AM

## 2020-08-22 ENCOUNTER — Encounter
Admission: EM | Disposition: A | Discharge status: Skilled Nursing Facility | Payer: Self-pay | Source: Home / Self Care | Attending: Internal Medicine

## 2020-08-22 ENCOUNTER — Inpatient Hospital Stay: Payer: Medicare HMO | Admitting: Anesthesiology

## 2020-08-22 ENCOUNTER — Encounter: Payer: Self-pay | Admitting: Internal Medicine

## 2020-08-22 HISTORY — PX: TRANSMETATARSAL AMPUTATION: SHX6197

## 2020-08-22 LAB — AEROBIC/ANAEROBIC CULTURE W GRAM STAIN (SURGICAL/DEEP WOUND)

## 2020-08-22 LAB — RENAL FUNCTION PANEL
Albumin: 1.9 g/dL — ABNORMAL LOW (ref 3.5–5.0)
Anion gap: 9 (ref 5–15)
BUN: 19 mg/dL (ref 8–23)
CO2: 25 mmol/L (ref 22–32)
Calcium: 8.7 mg/dL — ABNORMAL LOW (ref 8.9–10.3)
Chloride: 105 mmol/L (ref 98–111)
Creatinine, Ser: 1.63 mg/dL — ABNORMAL HIGH (ref 0.61–1.24)
GFR, Estimated: 46 mL/min — ABNORMAL LOW (ref >60)
Glucose, Bld: 70 mg/dL (ref 70–99)
Phosphorus: 3.3 mg/dL (ref 2.5–4.6)
Potassium: 3.8 mmol/L (ref 3.5–5.1)
Sodium: 139 mmol/L (ref 135–145)

## 2020-08-22 LAB — GLUCOSE, CAPILLARY
Glucose-Capillary: 95 mg/dL (ref 70–99)
Glucose-Capillary: 81 mg/dL (ref 70–99)
Glucose-Capillary: 98 mg/dL (ref 70–99)
Glucose-Capillary: 123 mg/dL — ABNORMAL HIGH (ref 70–99)

## 2020-08-22 LAB — CBC
HCT: 34.9 % — ABNORMAL LOW (ref 39.0–52.0)
Hemoglobin: 11.1 g/dL — ABNORMAL LOW (ref 13.0–17.0)
MCH: 25.8 pg — ABNORMAL LOW (ref 26.0–34.0)
MCHC: 31.8 g/dL (ref 30.0–36.0)
MCV: 81 fL (ref 80.0–100.0)
Platelets: 396 10*3/uL (ref 150–400)
RBC: 4.31 MIL/uL (ref 4.22–5.81)
RDW: 14.7 % (ref 11.5–15.5)
WBC: 12.4 10*3/uL — ABNORMAL HIGH (ref 4.0–10.5)
nRBC: 0 % (ref 0.0–0.2)

## 2020-08-22 LAB — HEPARIN LEVEL (UNFRACTIONATED)
Heparin Unfractionated: 0.26 IU/mL — ABNORMAL LOW (ref 0.30–0.70)
Heparin Unfractionated: <0.1 IU/mL — ABNORMAL LOW (ref 0.30–0.70)

## 2020-08-22 LAB — MAGNESIUM: Magnesium: 2 mg/dL (ref 1.7–2.4)

## 2020-08-22 SURGERY — AMPUTATION, FOOT, TRANSMETATARSAL
Anesthesia: General | Site: Foot | Laterality: Left

## 2020-08-22 MED ORDER — FENTANYL CITRATE (PF) 100 MCG/2ML IJ SOLN
INTRAMUSCULAR | Status: DC | PRN
  Administered 2020-08-22 (×2): 25 ug via INTRAVENOUS

## 2020-08-22 MED ORDER — BUPIVACAINE LIPOSOME 1.3 % IJ SUSP
INTRAMUSCULAR | Status: AC
  Filled 2020-08-22: qty 20

## 2020-08-22 MED ORDER — BUPIVACAINE-EPINEPHRINE (PF) 0.5% -1:200000 IJ SOLN
INTRAMUSCULAR | Status: DC | PRN
  Administered 2020-08-22: 10 mL
  Administered 2020-08-22: 20 mL

## 2020-08-22 MED ORDER — BUPIVACAINE LIPOSOME 1.3 % IJ SUSP
INTRAMUSCULAR | Status: DC | PRN
  Administered 2020-08-22 (×2): 10 mL

## 2020-08-22 MED ORDER — FENTANYL CITRATE (PF) 100 MCG/2ML IJ SOLN
INTRAMUSCULAR | Status: AC
  Filled 2020-08-22: qty 2

## 2020-08-22 MED ORDER — BUPIVACAINE HCL (PF) 0.5 % IJ SOLN
INTRAMUSCULAR | Status: DC | PRN
  Administered 2020-08-22: 10 mL

## 2020-08-22 MED ORDER — PROPOFOL 10 MG/ML IV BOLUS
INTRAVENOUS | Status: AC
  Filled 2020-08-22: qty 20

## 2020-08-22 MED ORDER — PROPOFOL 10 MG/ML IV BOLUS
INTRAVENOUS | Status: DC | PRN
  Administered 2020-08-22: 50 mg via INTRAVENOUS
  Administered 2020-08-22: 120 mg via INTRAVENOUS

## 2020-08-22 MED ORDER — CIPROFLOXACIN IN D5W 400 MG/200ML IV SOLN
INTRAVENOUS | Status: AC
  Filled 2020-08-22: qty 200

## 2020-08-22 MED ORDER — SODIUM CHLORIDE 0.9 % IV SOLN: INTRAVENOUS | Status: DC

## 2020-08-22 MED ORDER — LIDOCAINE HCL (CARDIAC) PF 100 MG/5ML IV SOSY
PREFILLED_SYRINGE | INTRAVENOUS | Status: DC | PRN
  Administered 2020-08-22: 100 mg via INTRAVENOUS

## 2020-08-22 MED ORDER — CIPROFLOXACIN IN D5W 400 MG/200ML IV SOLN
400.0000 mg | Freq: Two times a day (BID) | INTRAVENOUS | Status: DC
  Administered 2020-08-22 – 2020-08-24 (×5): 400 mg via INTRAVENOUS
  Filled 2020-08-22 (×6): qty 200

## 2020-08-22 SURGICAL SUPPLY — 44 items
BAG COUNTER SPONGE EZ (MISCELLANEOUS) IMPLANT
BLADE 10 SAFETY STRL DISP (BLADE) ×10 IMPLANT
BNDG COHESIVE 4X5 TAN STRL (GAUZE/BANDAGES/DRESSINGS) ×2 IMPLANT
BNDG ELASTIC 4X5.8 VLCR NS LF (GAUZE/BANDAGES/DRESSINGS) ×2 IMPLANT
BNDG ESMARK 4X12 TAN STRL LF (GAUZE/BANDAGES/DRESSINGS) ×2 IMPLANT
BNDG GAUZE 4.5X4.1 6PLY STRL (MISCELLANEOUS) ×2 IMPLANT
BNDG STRETCH 4X75 STRL LF (GAUZE/BANDAGES/DRESSINGS) ×2 IMPLANT
CANISTER SUCT 1200ML W/VALVE (MISCELLANEOUS) ×2 IMPLANT
COVER WAND RF STERILE (DRAPES) ×2 IMPLANT
CUFF TOURN SGL QUICK 12 (TOURNIQUET CUFF) ×2 IMPLANT
CUFF TOURN SGL QUICK 18X4 (TOURNIQUET CUFF) ×2 IMPLANT
DRAIN PENROSE 1/4X12 LTX STRL (WOUND CARE) IMPLANT
DURAPREP 26ML APPLICATOR (WOUND CARE) ×2 IMPLANT
ELECT REM PT RETURN 9FT ADLT (ELECTROSURGICAL) ×2
ELECTRODE REM PT RTRN 9FT ADLT (ELECTROSURGICAL) ×1 IMPLANT
GAUZE SPONGE 4X4 12PLY STRL (GAUZE/BANDAGES/DRESSINGS) ×4 IMPLANT
GAUZE XEROFORM 1X8 LF (GAUZE/BANDAGES/DRESSINGS) ×2 IMPLANT
GLOVE BIO SURGEON STRL SZ7.5 (GLOVE) ×2 IMPLANT
GLOVE INDICATOR 8.0 STRL GRN (GLOVE) ×2 IMPLANT
GOWN STRL REUS W/ TWL XL LVL3 (GOWN DISPOSABLE) ×2 IMPLANT
GOWN STRL REUS W/TWL XL LVL3 (GOWN DISPOSABLE) ×2
HANDLE YANKAUER SUCT BULB TIP (MISCELLANEOUS) IMPLANT
KIT TURNOVER KIT A (KITS) ×2 IMPLANT
LABEL OR SOLS (LABEL) IMPLANT
MANIFOLD NEPTUNE II (INSTRUMENTS) ×2 IMPLANT
NDL SAFETY ECLIPSE 18X1.5 (NEEDLE) ×1 IMPLANT
NEEDLE FILTER BLUNT 18X 1/2SAF (NEEDLE) ×1
NEEDLE FILTER BLUNT 18X1 1/2 (NEEDLE) ×1 IMPLANT
NEEDLE HYPO 18GX1.5 SHARP (NEEDLE) ×1
NS IRRIG 500ML POUR BTL (IV SOLUTION) ×2 IMPLANT
PACK EXTREMITY ARMC (MISCELLANEOUS) ×2 IMPLANT
PAD ABD DERMACEA PRESS 5X9 (GAUZE/BANDAGES/DRESSINGS) ×6 IMPLANT
SET SUCTION IRRIG HYDROSURG (IRRIGATION / IRRIGATOR) ×2 IMPLANT
SOL .9 NS 3000ML IRR  AL (IV SOLUTION) ×1
SOL .9 NS 3000ML IRR UROMATIC (IV SOLUTION) ×1 IMPLANT
SOL PREP PVP 2OZ (MISCELLANEOUS) ×2
SOLUTION PREP PVP 2OZ (MISCELLANEOUS) ×1 IMPLANT
SPONGE LAP 18X18 RF (DISPOSABLE) ×2 IMPLANT
STOCKINETTE M/LG 89821 (MISCELLANEOUS) ×2 IMPLANT
SUT ETHILON 2 0 FS 18 (SUTURE) ×6 IMPLANT
SUT VIC AB 2-0 SH 27 (SUTURE) ×2
SUT VIC AB 2-0 SH 27XBRD (SUTURE) ×2 IMPLANT
SYR 10ML LL (SYRINGE) ×4 IMPLANT
TOWEL OR 17X26 4PK STRL BLUE (TOWEL DISPOSABLE) ×2 IMPLANT

## 2020-08-22 NOTE — Progress Notes (Signed)
Report given from [facility] PACU elevate left foot NWB patient in no pain tolerated amputation  procedure well

## 2020-08-22 NOTE — Progress Notes (Signed)
Per Randa Ngo MD stop heparin infusion for procedure.

## 2020-08-22 NOTE — Consult Note (Signed)
ANTICOAGULATION CONSULT NOTE  Pharmacy Consult for Heparin  Indication: lower extremity recanullization (angioplasty Left lower limb)  Patient Measurements: Height: 6' (182.9 cm) Weight: 86.2 kg (190 lb 0.6 oz) IBW/kg (Calculated) : 77.6  Vital Signs: Temp: 98.3 F (36.8 C) (12/17 0405) Temp Source: Oral (12/17 0405) BP: 155/85 (12/17 0405) Pulse Rate: 44 (12/17 0405)  Labs: Recent Labs    08/20/20 0109 08/20/20 0815 08/21/20 0200 08/21/20 0820 08/22/20 0443  HGB 10.0*  --  10.6*  --  11.1*  HCT 30.8*  --  32.7*  --  34.9*  PLT 390  --  391  --  396  HEPARINUNFRC 0.23*   < > 0.36 0.39 0.26*  CREATININE 1.64*  --  1.61*  --  1.63*   < > = values in this interval not displayed.    Estimated Creatinine Clearance: 48.3 mL/min (A) (by C-G formula based on SCr of 1.63 mg/dL (H)).   Assessment: 69 y.o. male with medical history significant for PVD with left foot gangrene (s/p angioplasty left dorsalis pedis 08/05/2020), IDT2DM, CKD stage III, HTN, HLD, and tobacco use who presents with gas gangrene of the left fourth and fifth toes and peripheral vascular disease with limb threatening ischemia.  Consulted 12/14 to start heparin 2hrs after sheath removal s/p angiogram.  H&H, platelets stable, no reported bleeding  Heparin Course: 12/14 initiation: 2500 unit bolus, then 1050 units/hr 12/15 0109 HL 0.23: increased to 1150 units/hr 12/15 0815 HL 0.20: increased to 1300 units/hr 12/15 1827 HL 0.29 increased to 1400 units/hr  12/16 0200 HL 0.36: no change 12/16 0820 HL 0.39: no change 12/17 0443 HL 0.26 , subtherapeutic   Goal of Therapy:  Heparin level 0.3-0.5 units/ml Monitor platelets by anticoagulation protocol: Yes   Plan:  12/17:  HL @ 0443 = 0.26 Will increase heparin drip to 1500 units/hr.  Will recheck HL 6 hrs after rate change.   Errik Mitchelle D 08/22/2020 6:18 AM

## 2020-08-22 NOTE — Anesthesia Preprocedure Evaluation (Addendum)
Anesthesia Evaluation  Patient identified by MRN, date of birth, ID band Patient awake    Reviewed: Allergy & Precautions, H&P , NPO status , Patient's Chart, lab work & pertinent test results, reviewed documented beta blocker date and time   Airway Mallampati: II  TM Distance: >3 FB Neck ROM: full    Dental  (+) Teeth Intact   Pulmonary neg pulmonary ROS, Current Smoker and Patient abstained from smoking.,    Pulmonary exam normal        Cardiovascular Exercise Tolerance: Good hypertension, On Medications + Peripheral Vascular Disease  Normal cardiovascular exam Rate:Normal     Neuro/Psych negative neurological ROS  negative psych ROS   GI/Hepatic negative GI ROS, Neg liver ROS,   Endo/Other  negative endocrine ROSdiabetes  Renal/GU Renal InsufficiencyRenal disease  negative genitourinary   Musculoskeletal  (+) Arthritis , Osteoarthritis,    Abdominal   Peds  Hematology negative hematology ROS (+)   Anesthesia Other Findings Past Medical History: No date: Arthritis No date: CKD (chronic kidney disease), stage III (HCC) No date: Diabetes mellitus without complication (HCC) No date: Hyperlipidemia No date: Hypertension  Reproductive/Obstetrics negative OB ROS                             Anesthesia Physical  Anesthesia Plan  ASA: III  Anesthesia Plan: General   Post-op Pain Management:    Induction: Intravenous  PONV Risk Score and Plan:   Airway Management Planned: LMA  Additional Equipment:   Intra-op Plan:   Post-operative Plan: Extubation in OR  Informed Consent: I have reviewed the patients History and Physical, chart, labs and discussed the procedure including the risks, benefits and alternatives for the proposed anesthesia with the patient or authorized representative who has indicated his/her understanding and acceptance.       Plan Discussed with:  CRNA  Anesthesia Plan Comments:         Anesthesia Quick Evaluation

## 2020-08-22 NOTE — Progress Notes (Addendum)
[facility]  - [place] at [facility]   PATIENT NAME: William Melton    MR#:  [id number]  DATE OF BIRTH:  04/12/1951  SUBJECTIVE:   Denies any complaints.  NPO for surgery. REVIEW OF SYSTEMS:   Review of Systems  Constitutional: Negative for chills, fever and weight loss.  HENT: Negative for ear discharge, ear pain and nosebleeds.   Eyes: Negative for blurred vision, pain and discharge.  Respiratory: Negative for sputum production, shortness of breath, wheezing and stridor.   Cardiovascular: Negative for chest pain, palpitations, orthopnea and PND.  Gastrointestinal: Negative for abdominal pain, diarrhea, nausea and vomiting.  Genitourinary: Negative for frequency and urgency.  Musculoskeletal: Negative for back pain and joint pain.  Neurological: Negative for sensory change, speech change, focal weakness and weakness.  Psychiatric/Behavioral: Negative for depression and hallucinations. The patient is nervous/anxious.    Tolerating Diet: yes Tolerating PT: commence rehab  DRUG ALLERGIES:  No Known Allergies  VITALS:  Blood pressure (!) 182/90, pulse 76, temperature 98.9 F (37.2 C), temperature source Temporal, resp. rate 18, height 6' (1.829 m), weight 86.2 kg, SpO2 96 %.  PHYSICAL EXAMINATION:   Physical Exam  GENERAL:  69 y.o.-year-old patient lying in the bed with no acute distress.  LUNGS: Normal breath sounds bilaterally, no wheezing, rales, rhonchi. No use of accessory muscles of respiration.  CARDIOVASCULAR: S1, S2 normal. No murmurs, rubs, or gallops.  ABDOMEN: Soft, nontender, nondistended. Bowel sounds present. No organomegaly or mass.  EXTREMITIES: from 08/20/2020  08/21/20    PSYCHIATRIC:  patient is alert and oriented x 3.  SKIN: as above  LABORATORY PANEL:  CBC Recent Labs  Lab 08/22/20 0443  WBC 12.4*  HGB 11.1*  HCT 34.9*  PLT 396    Chemistries  Recent Labs  Lab 08/16/20 1721 08/17/20 0312 08/22/20 0443  NA 137   < >  139  K 3.0*   < > 3.8  CL 99   < > 105  CO2 24   < > 25  GLUCOSE 81   < > 70  BUN 31*   < > 19  CREATININE 1.81*   < > 1.63*  CALCIUM 9.1   < > 8.7*  MG  --    < > 2.0  AST 16  --   --   ALT 11  --   --   ALKPHOS 72  --   --   BILITOT 1.3*  --   --    < > = values in this interval not displayed.   Cardiac Enzymes No results for input(s): TROPONINI in the last 168 hours. RADIOLOGY:  No results found. ASSESSMENT AND PLAN:  William Melton is a 69 y.o. 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 - 12/13--Taken to the operating room  for amputation by podiatry by Dr. Excell Seltzer.  - 12/14-- s/p  angiogram today with Dr. Gilda Crease vascular surgery PTA of left distal anterior tibial and dorsalis pedis artery. Pt is on IV heparin gtt per Dr Eustace Quail recommendations   --12/15-- patient's left surgical first appears dusky looking with nonviable tissue around. Podiatry has discussed with patient and son regarding possible consideration of BKA. Patient is not keen on getting  it done. --12/16-- awaiting Podiatry evaluation of foot and further recs --12/17-- for trans metatarsal amputation today --IV cipro and Po flagyl--given Wound culture growing Morganella and Providencia  Acute kidney injury on chronic kidney disease stage IIIa.   -creatinine down from 1.81 down to 1.50.  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   PAD/Diabetic foot -Patient on low-dose glargine insulin 20 units. -  Hopefully can go back on higher dose insulin once eating 3 meals a day.  Essential hypertension - on Norvasc, metoprolol  Hyperlipidemia unspecified - on  pravastatin  Tobacco abuse on nicotine patch    Procedures: left fourth and fifth toes amputation, status post angiogram left lower extremity Family communication : son William Melton on 12/15 Consults : CODE STATUS: full DVT Prophylaxis :Heparin gtt  Status is: Inpatient  Remains inpatient appropriate because:Inpatient level of care appropriate due to severity of illness   Dispo: The patient is from: Home              Anticipated d/c is to: rehab              Anticipated d/c date is: 3 days              Patient currently is not medically stable to d/c. patient undergoing trans metatarsal amputation. Discharge to rehab will be determined by podiatry once pt is stable  TOTAL TIME TAKING CARE OF THIS PATIENT: 25 minutes.  >50% time spent on counselling and coordination of care  Note: This dictation was prepared with Dragon dictation along with smaller phrase technology. Any transcriptional errors that result from this process are unintentional.  William Melton M.D    [facility]   CC: Primary care physician; William Melton, FNPPatient ID: William Melton, male   DOB: 1951-02-03, 69 y.o.   MRN: [id number]

## 2020-08-22 NOTE — Consult Note (Signed)
ANTICOAGULATION CONSULT NOTE  Pharmacy Consult for Heparin  Indication: lower extremity recanullization (angioplasty Left lower limb)  Patient Measurements: Height: 6' (182.9 cm) Weight: 86.2 kg (190 lb 0.6 oz) IBW/kg (Calculated) : 77.6  Vital Signs: Temp: 98.3 F (36.8 C) (12/17 0405) Temp Source: Oral (12/17 0405) BP: 155/85 (12/17 0405) Pulse Rate: 44 (12/17 0405)  Labs: Recent Labs    08/20/20 0109 08/20/20 0815 08/21/20 0200 08/21/20 0820 08/22/20 0443  HGB 10.0*  --  10.6*  --  11.1*  HCT 30.8*  --  32.7*  --  34.9*  PLT 390  --  391  --  396  HEPARINUNFRC 0.23*   < > 0.36 0.39 0.26*  CREATININE 1.64*  --  1.61*  --  1.63*   < > = values in this interval not displayed.    Estimated Creatinine Clearance: 48.3 mL/min (A) (by C-G formula based on SCr of 1.63 mg/dL (H)).   Assessment: 69 y.o. male with medical history significant for PVD with left foot gangrene (s/p angioplasty left dorsalis pedis 08/05/2020), IDT2DM, CKD stage III, HTN, HLD, and tobacco use who presents with gas gangrene of the left fourth and fifth toes and peripheral vascular disease with limb threatening ischemia.  Consulted 12/14 to start heparin 2hrs after sheath removal s/p angiogram, debridement of 4th and 5th rays and upcoming transmetatarsal amputation today.  H&H, platelets stable, no reported bleeding  Heparin Course: 12/14 initiation: 2500 unit bolus, then 1050 units/hr 12/15 0109 HL 0.23: increased to 1150 units/hr 12/15 0815 HL 0.20: increased to 1300 units/hr 12/15 1827 HL 0.29 increased to 1400 units/hr  12/16 0200 HL 0.36: no change 12/16 0820 HL 0.39: no change 12/17 0443 HL 0.26: rate increased to 1500 units/hr 12/17 0728 heparin paused for upcoming surgery  Goal of Therapy:  Heparin level 0.3-0.5 units/ml Monitor platelets by anticoagulation protocol: Yes   Plan:  Following surgery today we will contact Dr Ether Griffins for guidance regarding restarting heparin   Lowella Bandy 08/22/2020 7:39 AM

## 2020-08-22 NOTE — Transfer of Care (Signed)
Immediate Anesthesia Transfer of Care Note  Patient: William Melton  Procedure(s) Performed: TRANSMETATARSAL AMPUTATION-Left Foot (Left Foot)  Patient Location: PACU  Anesthesia Type:General  Level of Consciousness: drowsy  Airway & Oxygen Therapy: Patient Spontanous Breathing and Patient connected to face mask oxygen  Post-op Assessment: Report given to RN and Post -op Vital signs reviewed and stable  Post vital signs: Reviewed and stable  Last Vitals:  Vitals Value Taken Time  BP 159/90 08/22/20 1522  Temp    Pulse 72 08/22/20 1525  Resp 14 08/22/20 1525  SpO2 93 % 08/22/20 1525  Vitals shown include unvalidated device data.  Last Pain:  Vitals:   08/22/20 1314  TempSrc: Temporal  PainSc: 0-No pain      Patients Stated Pain Goal: 0 (08/21/20 2259)  Complications: No complications documented.

## 2020-08-22 NOTE — Care Management Important Message (Signed)
Important Message  Patient Details  Name: William Melton MRN: [id number] Date of Birth: March 29, 1951   Medicare Important Message Given:  Yes     [name] 08/22/2020, 12:08 PM

## 2020-08-22 NOTE — Op Note (Signed)
Operative note   Surgeon:Mahitha Hickling [employer] forces logistics/support/administrative officer: None    Preop diagnosis: Gangrene left forefoot    Postop diagnosis: Same    Procedure: 1.  Transmetatarsal amputation left forefoot 2.  Local rotational skin flap for primary closure left forefoot    EBL: Minimal    Anesthesia:local and general    Hemostasis: None    Specimen: Necrotic bone and tissue    Complications: None    Operative indications:William Melton is an 69 y.o. that presents today for surgical intervention.  The risks/benefits/alternatives/complications have been discussed and consent has been given.  Patient understands this is an attempt for limb salvage.  There is a very high risk of undergoing amputation if the skin flap does not take.   Procedure:  Patient was brought into the OR and placed on the operating table in thesupine position. After anesthesia was obtained theleft lower extremity was prepped and draped in usual sterile fashion.  Attention was directed to the left foot where the previous fourth and fifth ray amputations were noted and open.  There was necrotic tissue throughout the wound bed in this area as well as along the skin edges.  The third toe was completely necrotic with exposed bone to this area.  The first and second toes had good perfusion especially the most medial aspect of the foot.  At this time full-thickness skin flaps were carried from the base laterally of the fifth metatarsal to the level of the metatarsophalangeal joint region.  Full-thickness flaps were created.  At this time at the base of all metatarsals osteotomies were created and the bones were removed from the surgical field in toto.  Minimal bleeding was noted throughout.  At this time the creation of the skin flaps was then performed.  Rotational flaps with primary closure was performed to this wound.  Areas of local tissue were performed to allow for primary closure.  At this time the subcutaneous tissue was closed with a 2-0  Vicryl and the skin closed with a 2-0 nylon.  Perfusion was noted afterwards without any areas of dusky appearance.  A large bulky sterile dressing was applied to the left foot.    Patient tolerated the procedure and anesthesia well.  Was transported from the OR to the PACU with all vital signs stable and vascular status intact. To be discharged per routine protocol.  Will follow up in approximately 1 week in the outpatient clinic.

## 2020-08-22 NOTE — Progress Notes (Signed)
PHARMACY CONSULT NOTE  Pharmacy Consult for Electrolyte Monitoring and Replacement   Recent Labs: Potassium (mmol/L)  Date Value  08/22/2020 3.8   Magnesium (mg/dL)  Date Value  31/54/0086 2.0   Calcium (mg/dL)  Date Value  76/19/5093 8.7 (L)   Albumin (g/dL)  Date Value  26/71/2458 1.9 (L)   Phosphorus (mg/dL)  Date Value  09/98/3382 3.3   Sodium (mmol/L)  Date Value  08/22/2020 139   Corrected Ca: 10.4 mg/dL  Assessment: 68 y.o.malewith medical history significant forPVD with left foot gangrene (s/p angioplasty left dorsalis pedis 08/05/2020), DM, CKD stage III, HTN, HLD, and tobacco use who presents with gas gangrene of the left fourth and fifth toes and peripheral vascular disease with limb threatening ischemia, now s/p 4th and 5th ray amputation  Goal of Therapy:  Electrolytes WNL  Plan:   No electrolyte replacement warranted for today  Re-check electrolytes in am  Lowella Bandy ,PharmD Clinical Pharmacist 08/22/2020 7:39 AM

## 2020-08-22 NOTE — Anesthesia Procedure Notes (Addendum)
Procedure Name: LMA Insertion Date/Time: 08/22/2020 2:09 PM Performed by: Milagros Reap, CRNA Pre-anesthesia Checklist: Patient identified, Emergency Drugs available, Suction available, Patient being monitored and Timeout performed Patient Re-evaluated:Patient Re-evaluated prior to induction Oxygen Delivery Method: Circle system utilized Preoxygenation: Pre-oxygenation with 100% oxygen Induction Type: IV induction Ventilation: Mask ventilation without difficulty LMA: LMA inserted LMA Size: 4.0 Number of attempts: 1 Tube secured with: Tape

## 2020-08-22 NOTE — Progress Notes (Signed)
PT Cancellation Note  Patient Details Name: William Melton MRN: [id number] DOB: 07-02-1951   Cancelled Treatment:     PT hold this date. Having surgery under general anesthesia. Will re-eval tomorrow and continue to follow once new POC established.    Rushie Chestnut 08/22/2020, 1:06 PM

## 2020-08-23 LAB — GLUCOSE, CAPILLARY
Glucose-Capillary: 67 mg/dL — ABNORMAL LOW (ref 70–99)
Glucose-Capillary: 97 mg/dL (ref 70–99)
Glucose-Capillary: 107 mg/dL — ABNORMAL HIGH (ref 70–99)
Glucose-Capillary: 197 mg/dL — ABNORMAL HIGH (ref 70–99)
Glucose-Capillary: 166 mg/dL — ABNORMAL HIGH (ref 70–99)

## 2020-08-23 LAB — CBC
HCT: 33.6 % — ABNORMAL LOW (ref 39.0–52.0)
Hemoglobin: 10.6 g/dL — ABNORMAL LOW (ref 13.0–17.0)
MCH: 25.2 pg — ABNORMAL LOW (ref 26.0–34.0)
MCHC: 31.5 g/dL (ref 30.0–36.0)
MCV: 80 fL (ref 80.0–100.0)
Platelets: 378 10*3/uL (ref 150–400)
RBC: 4.2 MIL/uL — ABNORMAL LOW (ref 4.22–5.81)
RDW: 14.8 % (ref 11.5–15.5)
WBC: 12.6 10*3/uL — ABNORMAL HIGH (ref 4.0–10.5)
nRBC: 0 % (ref 0.0–0.2)

## 2020-08-23 LAB — HEPARIN LEVEL (UNFRACTIONATED)
Heparin Unfractionated: 0.11 IU/mL — ABNORMAL LOW (ref 0.30–0.70)
Heparin Unfractionated: <0.1 IU/mL — ABNORMAL LOW (ref 0.30–0.70)

## 2020-08-23 LAB — BASIC METABOLIC PANEL
Anion gap: 9 (ref 5–15)
BUN: 14 mg/dL (ref 8–23)
CO2: 25 mmol/L (ref 22–32)
Calcium: 8.4 mg/dL — ABNORMAL LOW (ref 8.9–10.3)
Chloride: 105 mmol/L (ref 98–111)
Creatinine, Ser: 1.23 mg/dL (ref 0.61–1.24)
GFR, Estimated: >60 mL/min (ref >60)
Glucose, Bld: 80 mg/dL (ref 70–99)
Potassium: 3.1 mmol/L — ABNORMAL LOW (ref 3.5–5.1)
Sodium: 139 mmol/L (ref 135–145)

## 2020-08-23 LAB — MAGNESIUM: Magnesium: 1.7 mg/dL (ref 1.7–2.4)

## 2020-08-23 MED ORDER — ALLOPURINOL 100 MG PO TABS
100.0000 mg | ORAL_TABLET | Freq: Every day | ORAL | Status: DC
  Administered 2020-08-23 – 2020-08-25 (×3): 100 mg via ORAL
  Filled 2020-08-23 (×3): qty 1

## 2020-08-23 MED ORDER — INSULIN GLARGINE 100 UNIT/ML ~~LOC~~ SOLN
15.0000 [IU] | Freq: Every day | SUBCUTANEOUS | Status: DC
  Administered 2020-08-23 – 2020-08-24 (×2): 15 [IU] via SUBCUTANEOUS
  Filled 2020-08-23 (×3): qty 0.15

## 2020-08-23 MED ORDER — CLOPIDOGREL BISULFATE 75 MG PO TABS
75.0000 mg | ORAL_TABLET | Freq: Every day | ORAL | Status: DC
  Administered 2020-08-23 – 2020-08-25 (×3): 75 mg via ORAL
  Filled 2020-08-23 (×3): qty 1

## 2020-08-23 MED ORDER — ASPIRIN EC 81 MG PO TBEC
81.0000 mg | DELAYED_RELEASE_TABLET | Freq: Every day | ORAL | Status: DC
  Administered 2020-08-23 – 2020-08-25 (×3): 81 mg via ORAL
  Filled 2020-08-23 (×3): qty 1

## 2020-08-23 MED ORDER — HEPARIN BOLUS VIA INFUSION
2600.0000 [IU] | Freq: Once | INTRAVENOUS | Status: DC
  Filled 2020-08-23: qty 2600

## 2020-08-23 MED ORDER — ENOXAPARIN SODIUM 40 MG/0.4ML ~~LOC~~ SOLN
40.0000 mg | SUBCUTANEOUS | Status: DC
  Administered 2020-08-23 – 2020-08-24 (×2): 40 mg via SUBCUTANEOUS
  Filled 2020-08-23 (×2): qty 0.4

## 2020-08-23 MED ORDER — POTASSIUM CHLORIDE CRYS ER 20 MEQ PO TBCR
40.0000 meq | EXTENDED_RELEASE_TABLET | Freq: Two times a day (BID) | ORAL | Status: AC
  Administered 2020-08-23 (×2): 40 meq via ORAL
  Filled 2020-08-23 (×2): qty 2

## 2020-08-23 MED ORDER — MAGNESIUM SULFATE 2 GM/50ML IV SOLN
2.0000 g | Freq: Once | INTRAVENOUS | Status: AC
  Administered 2020-08-23: 11:00:00 2 g via INTRAVENOUS
  Filled 2020-08-23: qty 50

## 2020-08-23 NOTE — Evaluation (Signed)
Physical Therapy Evaluation Patient Details Name: William Melton MRN: [id number] DOB: [birth year] Today's Date: 08/23/2020   History of Present Illness  Pt is a 69 yo male with a PMH that includes CKD, DM, HTN, and PVD.  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  Clinical Impression  Pt requires minA to complete supine > sit with heavy use of HOB elevated and handrails. Patient requires minA to initation STS transfer with heavy cuing for DME use, and sequencing. Once standing requires ModA to remain complete pivoting on RLE to maintain LLE NWB precautions. PT recommendation is SNF d/t decreased conditioning and ability to complete basic transfers and amb maintaining NWB. Would benefit from skilled PT to address above deficits and promote optimal return to PLOF.     Follow Up Recommendations SNF;Supervision for mobility/OOB    Equipment Recommendations  Rolling walker with 5" wheels;3in1 (PT)    Recommendations for Other Services       Precautions / Restrictions Precautions Precautions: Fall Restrictions Weight Bearing Restrictions: Yes LLE Weight Bearing: Non weight bearing      Mobility  Bed Mobility Overal bed mobility: Needs Assistance Bed Mobility: Supine to Sit     Supine to sit: Min assist Sit to supine: Min assist   General bed mobility comments: Min A for BLE management during sup to sit    Transfers Overall transfer level: Needs assistance Equipment used: Rolling walker (2 wheeled) Transfers: Sit to/from [employer] Sit to Stand: Min assist;From elevated surface Stand pivot transfers: Mod assist       General transfer comment: MinA needed mostly for initiation, cuing for hand placemend and sequencing, reminder of precuations. For LLE pivoting toward chair modA to  maintain balance with maintaining LLE NWBP  Ambulation/Gait             General Gait Details: unable to take any true hop steps  Stairs            Wheelchair Mobility    Modified Rankin (Stroke Patients Only)       Balance Overall balance assessment: Needs assistance Sitting-balance support: Bilateral upper extremity supported Sitting balance-Leahy Scale: Good     Standing balance support: Bilateral upper extremity supported;During functional activity Standing balance-Leahy Scale: Poor Standing balance comment: Physical assist required for stability during standing activities                             Pertinent Vitals/Pain Pain Assessment: No/denies pain    Home Living Family/patient expects to be discharged to:: Private residence Living Arrangements: Alone Available Help at Discharge: Family;Available PRN/intermittently Type of Home: House Home Access: Ramped entrance     Home Layout: One level Home Equipment: Cane - single point      Prior Function Level of Independence: Independent with assistive device(s)         Comments: Mod Ind amb community distances with a SPC, 5-6 falls in the last year secondary to LOB, Ind with ADLs     Hand Dominance        Extremity/Trunk Assessment                Communication   Communication: No difficulties  Cognition Arousal/Alertness: Awake/alert Behavior During Therapy: WFL for tasks assessed/performed Overall Cognitive Status: Within Functional Limits for tasks assessed  General Comments      Exercises Other Exercises Other Exercises: supine to sit minA with cuing for use of bedrails to promote ind Other Exercises: STS minA with modA through pivoting transfer to chair; cuing needed for NWB and hand plcaement/set up Other Exercises: NWB precautions review   Assessment/Plan    PT Assessment Patient needs continued PT  services  PT Problem List Decreased strength;Decreased activity tolerance;Decreased balance;Decreased mobility;Decreased knowledge of precautions;Decreased knowledge of use of DME;Pain       PT Treatment Interventions DME instruction;Gait training;Functional mobility training;Therapeutic activities;Therapeutic exercise;Balance training;Patient/family education    PT Goals (Current goals can be found in the Care Plan section)  Acute Rehab PT Goals Patient Stated Goal: To get home and be independent PT Goal Formulation: With patient Time For Goal Achievement: 09/06/20    Frequency 7X/week   Barriers to discharge Inaccessible home environment;Decreased caregiver support      Co-evaluation               AM-PAC PT "6 Clicks" Mobility  Outcome Measure Help needed turning from your back to your side while in a flat bed without using bedrails?: A Little Help needed moving from lying on your back to sitting on the side of a flat bed without using bedrails?: A Little Help needed moving to and from a bed to a chair (including a wheelchair)?: A Lot Help needed standing up from a chair using your arms (e.g., wheelchair or bedside chair)?: A Little Help needed to walk in hospital room?: Total Help needed climbing 3-5 steps with a railing? : Total 6 Click Score: 13    End of Session Equipment Utilized During Treatment: Gait belt Activity Tolerance: Patient tolerated treatment well Patient left: in bed;with call bell/phone within reach;with bed alarm set Nurse Communication: Mobility status;Weight bearing status PT Visit Diagnosis: Unsteadiness on feet (R26.81);History of falling (Z91.81);Repeated falls (R29.6);Difficulty in walking, not elsewhere classified (R26.2);Muscle weakness (generalized) (M62.81);Pain Pain - Right/Left: Left Pain - part of body: Ankle and joints of foot    Time: 1125-1140 PT Time Calculation (min) (ACUTE ONLY): 15 min   Charges:   PT Evaluation $PT Eval  Moderate Complexity: 1 Mod PT Treatments $Therapeutic Activity: 8-22 mins        Hilda Lias DPT  Hilda Lias 08/23/2020, 11:51 AM

## 2020-08-23 NOTE — Consult Note (Addendum)
ANTICOAGULATION CONSULT NOTE  Pharmacy Consult for Heparin  Indication: lower extremity recanullization (angioplasty Left lower limb)  Patient Measurements: Height: 6' (182.9 cm) Weight: 86.2 kg (190 lb 0.6 oz) IBW/kg (Calculated) : 77.6  Vital Signs: Temp: 98.5 F (36.9 C) (12/18 0429) Temp Source: Oral (12/18 0429) BP: 162/84 (12/18 0429) Pulse Rate: 65 (12/18 0429)  Labs: Recent Labs    08/21/20 0200 08/21/20 0820 08/22/20 0443 08/22/20 1254 08/23/20 0449  HGB 10.6*  --  11.1*  --  10.6*  HCT 32.7*  --  34.9*  --  33.6*  PLT 391  --  396  --  378  HEPARINUNFRC 0.36   < > 0.26* <0.10* 0.11*  CREATININE 1.61*  --  1.63*  --  1.23   < > = values in this interval not displayed.    Estimated Creatinine Clearance: 64 mL/min (by C-G formula based on SCr of 1.23 mg/dL).   Assessment: 69 y.o. male with medical history significant for PVD with left foot gangrene (s/p angioplasty left dorsalis pedis 08/05/2020), IDT2DM, CKD stage III, HTN, HLD, and tobacco use who presents with gas gangrene of the left fourth and fifth toes and peripheral vascular disease with limb threatening ischemia.  Consulted 12/14 to start heparin 2hrs after sheath removal s/p angiogram, debridement of 4th and 5th rays and upcoming transmetatarsal amputation today.  H&H, platelets stable, no reported bleeding  Heparin Course: 12/14 initiation: 2500 unit bolus, then 1050 units/hr 12/15 0109 HL 0.23: increased to 1150 units/hr 12/15 0815 HL 0.20: increased to 1300 units/hr 12/15 1827 HL 0.29 increased to 1400 units/hr  12/16 0200 HL 0.36: no change 12/16 0820 HL 0.39: no change 12/17 0443 HL 0.26: rate increased to 1500 units/hr 12/17 0728 heparin paused for upcoming surgery 12/18 0449 HL 0.11   Goal of Therapy:  Heparin level 0.3-0.5 units/ml Monitor platelets by anticoagulation protocol: Yes   Plan:  12/18:  Heparin gtt restarted on 12/17 @ 1700 at 1500 units/hr. 12/18: HL @ 0449 = 0.11 Will  increase drip rate to 1650 units/hr.  Will recheck HL 6 hrs after rate change.   - Heparin 2600 unit IV X 1 bolus ordered by mistake , cancelled the order and called RN and told her to not give but she mistakenly gave the 2600 units anyways.    Celestino Ackerman D 08/23/2020 6:36 AM

## 2020-08-23 NOTE — Progress Notes (Signed)
[facility]  - [place] at [facility]   PATIENT NAME: William Melton    MR#:  [id number]  DATE OF BIRTH:  1951-03-09  SUBJECTIVE:  patient sitting out in the chair. Has had some loose bowel movement.  REVIEW OF SYSTEMS:   Review of Systems  Constitutional: Negative for chills, fever and weight loss.  HENT: Negative for ear discharge, ear pain and nosebleeds.   Eyes: Negative for blurred vision, pain and discharge.  Respiratory: Negative for sputum production, shortness of breath, wheezing and stridor.   Cardiovascular: Negative for chest pain, palpitations, orthopnea and PND.  Gastrointestinal: Negative for abdominal pain, diarrhea, nausea and vomiting.  Genitourinary: Negative for frequency and urgency.  Musculoskeletal: Negative for back pain and joint pain.  Neurological: Negative for sensory change, speech change, focal weakness and weakness.  Psychiatric/Behavioral: Negative for depression and hallucinations. The patient is nervous/anxious.    Tolerating Diet: yes Tolerating PT:  rehab  DRUG ALLERGIES:  No Known Allergies  VITALS:  Blood pressure (!) 150/85, pulse 74, temperature 98.5 F (36.9 C), temperature source Oral, resp. rate 18, height 6' (1.829 m), weight 86.2 kg, SpO2 97 %.  PHYSICAL EXAMINATION:   Physical Exam  GENERAL:  69 y.o.-year-old patient lying in the bed with no acute distress.  LUNGS: Normal breath sounds bilaterally, no wheezing, rales, rhonchi. No use of accessory muscles of respiration.  CARDIOVASCULAR: S1, S2 normal. No murmurs, rubs, or gallops.  ABDOMEN: Soft, nontender, nondistended. Bowel sounds present. No organomegaly or mass.  EXTREMITIES: from 08/20/2020  08/21/20   08/23/2020   PSYCHIATRIC:  patient is alert and oriented x 3.  SKIN: as above  LABORATORY PANEL:  CBC Recent Labs  Lab 08/23/20 0449  WBC 12.6*  HGB 10.6*  HCT 33.6*  PLT 378    Chemistries  Recent Labs  Lab 08/16/20 1721 08/17/20 0312  08/23/20 0449  NA 137   < > 139  K 3.0*   < > 3.1*  CL 99   < > 105  CO2 24   < > 25  GLUCOSE 81   < > 80  BUN 31*   < > 14  CREATININE 1.81*   < > 1.23  CALCIUM 9.1   < > 8.4*  MG  --    < > 1.7  AST 16  --   --   ALT 11  --   --   ALKPHOS 72  --   --   BILITOT 1.3*  --   --    < > = values in this interval not displayed.   Cardiac Enzymes No results for input(s): TROPONINI in the last 168 hours. RADIOLOGY:  No results found. ASSESSMENT AND PLAN:  William Melton is a 69 y.o. 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 - 12/13--Taken to the operating room  for amputation by podiatry by Dr. Excell Seltzer.  - 12/14-- s/p  angiogram today with Dr. Gilda Crease vascular surgery PTA of left distal anterior tibial and dorsalis pedis artery. Pt is on IV heparin gtt per Dr Eustace Quail recommendations   --12/15-- patient's left surgical first appears dusky looking with nonviable tissue around. Podiatry has discussed with patient and son regarding possible consideration of  BKA. Patient is not keen on getting it done. --12/16-- awaiting Podiatry evaluation of foot and further recs --12/17-- for trans metatarsal amputation today --IV cipro and Po flagyl--given Wound culture growing Morganella and Providencia --12/18--POD #1-- left trans metatarsal amputation. Stump looks okay. Resume physical therapy. -- Discuss with vascular surgery Dr. Erich Montane to  DC heparin drip. Will resume aspirin and Plavix.  Acute kidney injury on chronic kidney disease stage IIIa.   -creatinine down from 1.81 down to 1.50.  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    PAD/Diabetic foot --Patient on low-dose glargine insulin 15 units. -- Sugars stable  Essential hypertension - on Norvasc, metoprolol  Hyperlipidemia unspecified - on pravastatin  Tobacco abuse on nicotine patch    Procedures: left fourth and fifth toes amputation, status post angiogram left lower extremity Family communication : son William Melton  Consults : podiatry CODE STATUS: full DVT Prophylaxis : Lovenox Status is: Inpatient  Remains inpatient appropriate because:Inpatient level of care appropriate due to severity of illness   Dispo: The patient is from: Home              Anticipated d/c is to: rehab              Anticipated d/c date is: 3 days              Patient currently is not medically stable to d/c. Patient is s/p trans metatarsal amputation. Discharge to rehab will be determined by podiatry once pt is stable-- likely Monday if amputation stump appears stable.  TOTAL TIME TAKING CARE OF THIS PATIENT: 25 minutes.  >50% time spent on counselling and coordination of care  Note: This dictation was prepared with Dragon dictation along with smaller phrase technology. Any transcriptional errors that result from this process are unintentional.  Enedina Finner M.D    [facility]   CC: Primary care physician; Altamease Oiler, FNPPatient ID: William Melton, male   DOB: 1951/04/04, 69 y.o.   MRN: [id number]

## 2020-08-23 NOTE — Progress Notes (Signed)
Daily Progress Note   Subjective  - 1 Day Post-Op  Follow-up transmetatarsal amputation left foot.  No pain  Objective Vitals:   08/22/20 1920 08/22/20 2302 08/23/20 0429 08/23/20 0834  BP: 138/75 (!) 158/86 (!) 162/84 (!) 153/83  Pulse: 72 74 65 70  Resp: 17 18 18 18   Temp: 98.2 F (36.8 C) 98.6 F (37 C) 98.5 F (36.9 C) 98.5 F (36.9 C)  TempSrc: Oral Oral Oral Oral  SpO2: 100% 98% 98% 98%  Weight:      Height:        Physical Exam: Sutures intact.  Mild to moderate amount of blood on the bandage.  No purulence.  No obvious dusky appearance to the flap but small area of discoloration to the lateral aspect of the large flap translocated from the medial foot.          Laboratory CBC    Component Value Date/Time   WBC 12.6 (H) 08/23/2020 0449   HGB 10.6 (L) 08/23/2020 0449   HCT 33.6 (L) 08/23/2020 0449   PLT 378 08/23/2020 0449    BMET    Component Value Date/Time   NA 139 08/23/2020 0449   K 3.1 (L) 08/23/2020 0449   CL 105 08/23/2020 0449   CO2 25 08/23/2020 0449   GLUCOSE 80 08/23/2020 0449   BUN 14 08/23/2020 0449   CREATININE 1.23 08/23/2020 0449   CALCIUM 8.4 (L) 08/23/2020 0449   GFRNONAA >60 08/23/2020 0449    Assessment/Planning: Status post transmetatarsal amputation with local flap   Recommend strict nonweightbearing to the foot.  Wound today is stable.  No signs of necrosis at this time.  To reevaluate tomorrow.  If wound continues to look stable at that time then likely okay to discharge from podiatry standpoint.   08/25/2020 A  08/23/2020, 10:48 AM

## 2020-08-23 NOTE — Progress Notes (Signed)
PHARMACY CONSULT NOTE  Pharmacy Consult for Electrolyte Monitoring and Replacement   Recent Labs: Potassium (mmol/L)  Date Value  08/23/2020 3.1 (L)   Magnesium (mg/dL)  Date Value  05/39/7673 1.7   Calcium (mg/dL)  Date Value  41/93/7902 8.4 (L)   Albumin (g/dL)  Date Value  40/97/3532 1.9 (L)   Phosphorus (mg/dL)  Date Value  99/24/2683 3.3   Sodium (mmol/L)  Date Value  08/23/2020 139   Corrected Ca: 10.4 mg/dL  Assessment: 68 y.o.malewith medical history significant forPVD with left foot gangrene (s/p angioplasty left dorsalis pedis 08/05/2020), DM, CKD stage III, HTN, HLD, and tobacco use who presents with gas gangrene of the left fourth and fifth toes and peripheral vascular disease with limb threatening ischemia, now s/p 4th and 5th ray amputation  On NS @ 50 mL/hr.   Goal of Therapy:  Electrolytes WNL  Plan:   Will give Mg 2 g IV x 1   Will give Kcl 40 mEq x 2.   Re-check electrolytes in am  Ronnald Ramp ,PharmD Clinical Pharmacist 08/23/2020 8:34 AM

## 2020-08-24 LAB — GLUCOSE, CAPILLARY
Glucose-Capillary: 125 mg/dL — ABNORMAL HIGH (ref 70–99)
Glucose-Capillary: 169 mg/dL — ABNORMAL HIGH (ref 70–99)
Glucose-Capillary: 183 mg/dL — ABNORMAL HIGH (ref 70–99)
Glucose-Capillary: 181 mg/dL — ABNORMAL HIGH (ref 70–99)

## 2020-08-24 LAB — MAGNESIUM: Magnesium: 1.9 mg/dL (ref 1.7–2.4)

## 2020-08-24 LAB — BASIC METABOLIC PANEL
Anion gap: 7 (ref 5–15)
BUN: 18 mg/dL (ref 8–23)
CO2: 21 mmol/L — ABNORMAL LOW (ref 22–32)
Calcium: 8.1 mg/dL — ABNORMAL LOW (ref 8.9–10.3)
Chloride: 106 mmol/L (ref 98–111)
Creatinine, Ser: 1.43 mg/dL — ABNORMAL HIGH (ref 0.61–1.24)
GFR, Estimated: 54 mL/min — ABNORMAL LOW (ref >60)
Glucose, Bld: 136 mg/dL — ABNORMAL HIGH (ref 70–99)
Potassium: 3.4 mmol/L — ABNORMAL LOW (ref 3.5–5.1)
Sodium: 134 mmol/L — ABNORMAL LOW (ref 135–145)

## 2020-08-24 MED ORDER — MAGNESIUM SULFATE 2 GM/50ML IV SOLN
2.0000 g | Freq: Once | INTRAVENOUS | Status: AC
  Administered 2020-08-24: 16:00:00 2 g via INTRAVENOUS
  Filled 2020-08-24: qty 50

## 2020-08-24 MED ORDER — POTASSIUM CHLORIDE CRYS ER 20 MEQ PO TBCR
40.0000 meq | EXTENDED_RELEASE_TABLET | Freq: Two times a day (BID) | ORAL | Status: AC
  Administered 2020-08-24 (×2): 40 meq via ORAL
  Filled 2020-08-24 (×2): qty 2

## 2020-08-24 MED ORDER — CIPROFLOXACIN HCL 500 MG PO TABS
500.0000 mg | ORAL_TABLET | Freq: Two times a day (BID) | ORAL | Status: DC
  Administered 2020-08-24 – 2020-08-25 (×2): 500 mg via ORAL
  Filled 2020-08-24 (×2): qty 1

## 2020-08-24 NOTE — TOC Progression Note (Signed)
Transition of Care [facility]) - Progression Note    Patient Details  Name: EDER MACEK MRN: [id number] Date of Birth: Dec 27, 1950  [facility]) CM/SW Contact  Eilleen Kempf, LCSW Phone Number: 08/24/2020, 12:37 PM  Clinical Narrative:    CSW submitted authorization request via [employer] portal. [id number], patient accepted [employer], waiting for insurance auth approval   Expected Discharge Plan: Skilled Nursing Facility Barriers to Discharge: Continued Medical Work up  Expected Discharge Plan and Services Expected Discharge Plan: Skilled Nursing Facility   Discharge Planning Services: CM Consult   Living arrangements for the past 2 months: Single Family Home                                       Social Determinants of Health (SDOH) Interventions    Readmission Risk Interventions No flowsheet data found.

## 2020-08-24 NOTE — Progress Notes (Signed)
PHARMACY CONSULT NOTE  Pharmacy Consult for Electrolyte Monitoring and Replacement   Recent Labs: Potassium (mmol/L)  Date Value  08/24/2020 3.4 (L)   Magnesium (mg/dL)  Date Value  94/85/4627 1.9   Calcium (mg/dL)  Date Value  03/50/0938 8.1 (L)   Albumin (g/dL)  Date Value  18/29/9371 1.9 (L)   Phosphorus (mg/dL)  Date Value  69/67/8938 3.3   Sodium (mmol/L)  Date Value  08/24/2020 134 (L)   Corrected Ca: 10.4 mg/dL  Assessment: 69 y.o.malewith medical history significant forPVD with left foot gangrene (s/p angioplasty left dorsalis pedis 08/05/2020), DM, CKD stage III, HTN, HLD, and tobacco use who presents with gas gangrene of the left fourth and fifth toes and peripheral vascular disease with limb threatening ischemia, now s/p 4th and 5th ray amputation  On NS @ 50 mL/hr.   Goal of Therapy:  Electrolytes WNL  Plan:   Will give Mg 2 g IV x 1   Will give Kcl 40 mEq x 2.   Re-check electrolytes in am  Ronnald Ramp ,PharmD Clinical Pharmacist 08/24/2020 9:41 AM

## 2020-08-24 NOTE — Progress Notes (Signed)
[facility]  - [place] at [facility]   PATIENT NAME: William Melton    MR#:  [id number]  DATE OF BIRTH:  1950/12/26  SUBJECTIVE:  no new complaints. Seen by podiatry earlier.  REVIEW OF SYSTEMS:   Review of Systems  Constitutional: Negative for chills, fever and weight loss.  HENT: Negative for ear discharge, ear pain and nosebleeds.   Eyes: Negative for blurred vision, pain and discharge.  Respiratory: Negative for sputum production, shortness of breath, wheezing and stridor.   Cardiovascular: Negative for chest pain, palpitations, orthopnea and PND.  Gastrointestinal: Negative for abdominal pain, diarrhea, nausea and vomiting.  Genitourinary: Negative for frequency and urgency.  Musculoskeletal: Negative for back pain and joint pain.  Neurological: Negative for sensory change, speech change, focal weakness and weakness.  Psychiatric/Behavioral: Negative for depression and hallucinations. The patient is nervous/anxious.    Tolerating Diet: yes Tolerating PT:  rehab  DRUG ALLERGIES:  No Known Allergies  VITALS:  Blood pressure (!) (P) 171/89, pulse (P) 73, temperature (P) 97.8 F (36.6 C), temperature source (P) Oral, resp. rate (P) 18, height 6' (1.829 m), weight 86.2 kg, SpO2 (P) 98 %.  PHYSICAL EXAMINATION:   Physical Exam  GENERAL:  69 y.o.-year-old patient lying in the bed with no acute distress.  LUNGS: Normal breath sounds bilaterally, no wheezing, rales, rhonchi. No use of accessory muscles of respiration.  CARDIOVASCULAR: S1, S2 normal. No murmurs, rubs, or gallops.  ABDOMEN: Soft, nontender, nondistended. Bowel sounds present. No organomegaly or mass.  EXTREMITIES: from 08/20/2020  08/21/20   08/23/2020 08/24/2020     PSYCHIATRIC:  patient is alert and oriented x 3.  SKIN: as above  LABORATORY PANEL:  CBC Recent Labs  Lab 08/23/20 0449  WBC 12.6*  HGB 10.6*  HCT 33.6*  PLT 378    Chemistries  Recent Labs  Lab 08/24/20 0847   NA 134*  K 3.4*  CL 106  CO2 21*  GLUCOSE 136*  BUN 18  CREATININE 1.43*  CALCIUM 8.1*  MG 1.9   Cardiac Enzymes No results for input(s): TROPONINI in the last 168 hours. RADIOLOGY:  No results found. ASSESSMENT AND PLAN:  William Melton is a 69 y.o. 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 - 12/13--Taken to the operating room  for amputation by podiatry by Dr. Excell Seltzer.  - 12/14-- s/p  angiogram today with Dr. Gilda Crease vascular surgery PTA of left distal anterior tibial and dorsalis pedis artery. Pt is on IV heparin gtt per Dr Eustace Quail recommendations   --12/15-- patient's left surgical first appears dusky looking with nonviable tissue around. Podiatry has discussed with patient and son regarding possible consideration of BKA. Patient is not keen on getting it done. --12/16-- awaiting Podiatry evaluation of foot and further recs --12/17-- for trans metatarsal amputation today --IV cipro and Po flagyl--given Wound culture growing Morganella and Providencia --12/18--POD #1-- left trans metatarsal amputation. Stump looks okay. Resume physical therapy. -- Discuss with vascular surgery Dr. Erich Montane to  DC heparin drip. Will resume aspirin and Plavix. --12/19-- change to oral Cipro. Will continue Cipro Flagyl for five more days. Oh okay from podiatry standpoint for discharge.  Acute kidney injury on chronic kidney disease stage IIIa.   -  creatinine down from 1.81 down to 1.50.  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   PAD/Diabetic foot --Patient on low-dose glargine insulin 15 units. -- Sugars stable  Essential hypertension -  on Norvasc, metoprolol  Hyperlipidemia unspecified - on pravastatin  Tobacco abuse on nicotine patch    Procedures: left fourth and fifth toes amputation, status post angiogram left lower extremity Family communication : son [name]  Consults : podiatry CODE STATUS: full DVT Prophylaxis : Lovenox Status is: Inpatient  Remains inpatient appropriate because:Inpatient level of care appropriate due to severity of illness   Dispo: The patient is from: Home              Anticipated d/c is to: rehab              Anticipated d/c date is: 3 days              Patient currently is not medically stable to d/c. Patient is s/p trans metatarsal amputation. Discharge to rehab when insurance authorization approved. Patient has chosen [employer].  TOTAL TIME TAKING CARE OF THIS PATIENT: 20 minutes.  >50% time spent on counselling and coordination of care  Note: This dictation was prepared with Dragon dictation along with smaller phrase technology. Any transcriptional errors that result from this process are unintentional.  Enedina Finner M.D    [facility]   CC: Primary care physician; Altamease Oiler, FNPPatient ID: William Melton, male   DOB: 03-08-1951, 69 y.o.   MRN: [id number]

## 2020-08-24 NOTE — Progress Notes (Signed)
PT Cancellation Note  Patient Details Name: William Melton MRN: [id number] DOB: 26-Jul-1951   Cancelled Treatment:    Reason Eval/Treat Not Completed: Other (comment)   Attempted x 2 this am.  Declined both attempts.  Will continue as appropriate.   Danielle Dess 08/24/2020, 1:21 PM

## 2020-08-24 NOTE — Progress Notes (Signed)
Daily Progress Note   Subjective  - 2 Days Post-Op  Follow-up transmetatarsal amputation.  Continues to have no complaints.  Objective Vitals:   08/23/20 1218 08/23/20 2209 08/24/20 0452 08/24/20 0814  BP: (!) 150/85 (!) 166/88 (!) 159/83 135/80  Pulse: 74 79 72 74  Resp:  14 16 18   Temp:  99 F (37.2 C) 99 F (37.2 C) 97.7 F (36.5 C)  TempSrc:  Oral Oral Oral  SpO2: 97% 96% 96% 97%  Weight:      Height:        Physical Exam: Wound once again is basically stable.  Some areas of bruising along the wound flaps.  No dehiscence or obvious necrosis.  Couple of dark areas but these are consistent with bleeding directly under the skin.  See clinical       picture.   Laboratory CBC    Component Value Date/Time   WBC 12.6 (H) 08/23/2020 0449   HGB 10.6 (L) 08/23/2020 0449   HCT 33.6 (L) 08/23/2020 0449   PLT 378 08/23/2020 0449    BMET    Component Value Date/Time   NA 134 (L) 08/24/2020 0847   K 3.4 (L) 08/24/2020 0847   CL 106 08/24/2020 0847   CO2 21 (L) 08/24/2020 0847   GLUCOSE 136 (H) 08/24/2020 0847   BUN 18 08/24/2020 0847   CREATININE 1.43 (H) 08/24/2020 0847   CALCIUM 8.1 (L) 08/24/2020 0847   GFRNONAA 54 (L) 08/24/2020 0847    Assessment/Planning: Status post transmetatarsal amputation for gangrene and gas producing infection   At this point we will just continue to monitor.  I would suggest continue nonweightbearing.  At this time we will have dressings changed while in house every other day and then can change weekly outpatient.  We will see patient in the clinic in the next 2 to 3 weeks to further monitor skin.  If skin flap continues to necrosis patient understands he would need amputation either below the knee or above the knee based on circulation and vascular surgery recommendations.  Would recommend 3 to 5 days of p.o. antibiotics upon discharge.  From podiatry standpoint okay to DC.    08/26/2020 A  08/24/2020, 12:24 PM

## 2020-08-24 NOTE — TOC Progression Note (Signed)
Transition of Care [facility]) - Progression Note    Patient Details  Name: William Melton MRN: [id number] Date of Birth: November 02, 1950  Transition of [facility]) CM/SW Contact  Eilleen Kempf, LCSW Phone Number: 08/24/2020, 2:53 PM  Clinical Narrative:    CSW received notice of insurance approval Auth # [id number]               Ref# [id number]    Expected Discharge Plan: Skilled Nursing Facility Barriers to Discharge: Continued Medical Work up  Expected Discharge Plan and Services Expected Discharge Plan: Skilled Nursing Facility   Discharge Planning Services: CM Consult   Living arrangements for the past 2 months: Single Family Home                                       Social Determinants of Health (SDOH) Interventions    Readmission Risk Interventions No flowsheet data found.

## 2020-08-25 ENCOUNTER — Encounter: Payer: Self-pay | Admitting: Podiatry

## 2020-08-25 LAB — BASIC METABOLIC PANEL
Anion gap: 6 (ref 5–15)
BUN: 16 mg/dL (ref 8–23)
CO2: 23 mmol/L (ref 22–32)
Calcium: 8.2 mg/dL — ABNORMAL LOW (ref 8.9–10.3)
Chloride: 105 mmol/L (ref 98–111)
Creatinine, Ser: 1.38 mg/dL — ABNORMAL HIGH (ref 0.61–1.24)
GFR, Estimated: 56 mL/min — ABNORMAL LOW (ref >60)
Glucose, Bld: 159 mg/dL — ABNORMAL HIGH (ref 70–99)
Potassium: 3.6 mmol/L (ref 3.5–5.1)
Sodium: 134 mmol/L — ABNORMAL LOW (ref 135–145)

## 2020-08-25 LAB — GLUCOSE, CAPILLARY
Glucose-Capillary: 153 mg/dL — ABNORMAL HIGH (ref 70–99)
Glucose-Capillary: 142 mg/dL — ABNORMAL HIGH (ref 70–99)

## 2020-08-25 LAB — SARS CORONAVIRUS 2 BY RT PCR (HOSPITAL ORDER, PERFORMED IN ~~LOC~~ HOSPITAL LAB): SARS Coronavirus 2: NEGATIVE

## 2020-08-25 LAB — MAGNESIUM: Magnesium: 2.1 mg/dL (ref 1.7–2.4)

## 2020-08-25 MED ORDER — INSULIN GLARGINE 100 UNIT/ML ~~LOC~~ SOLN
18.0000 [IU] | Freq: Every day | SUBCUTANEOUS | Status: DC
  Filled 2020-08-25: qty 0.18

## 2020-08-25 MED ORDER — CIPROFLOXACIN HCL 500 MG PO TABS: 500.0000 mg | ORAL_TABLET | Freq: Two times a day (BID) | ORAL | 0 refills | Status: AC

## 2020-08-25 MED ORDER — METRONIDAZOLE 500 MG PO TABS: 500.0000 mg | ORAL_TABLET | Freq: Three times a day (TID) | ORAL | 0 refills | Status: AC

## 2020-08-25 MED ORDER — METOPROLOL SUCCINATE ER 25 MG PO TB24: 25.0000 mg | ORAL_TABLET | Freq: Every day | ORAL | 0 refills | Status: AC

## 2020-08-25 MED ORDER — INSULIN GLARGINE 100 UNIT/ML ~~LOC~~ SOLN: 20.0000 [IU] | Freq: Every day | SUBCUTANEOUS | 11 refills | Status: AC

## 2020-08-25 NOTE — Care Management Important Message (Signed)
Important Message  Patient Details  Name: William Melton MRN: [id number] Date of Birth: May 17, 1951   Medicare Important Message Given:  Yes     [name] 08/25/2020, 11:07 AM

## 2020-08-25 NOTE — Progress Notes (Signed)
Patient left with EMS to d/c to [facility] gave report to facility staff. Dressing changes completed per order by Dr.Fowler. Iv taken out prior to d/c tolerated well.

## 2020-08-25 NOTE — Discharge Summary (Signed)
[facility] [place] [facility]   PATIENT NAME: William Melton    MR#:  [id number]  DATE OF BIRTH:  30-Jun-1951  DATE OF ADMISSION:  08/16/2020 ADMITTING PHYSICIAN: Charlsie Quest, MD  DATE OF DISCHARGE: 08/25/2020  PRIMARY CARE PHYSICIAN: Altamease Oiler, FNP    ADMISSION DIAGNOSIS:  Gas gangrene of foot (HCC) [A48.0] Chronic kidney disease, unspecified CKD stage [N18.9] Gangrene of extremity (HCC) [I96]  DISCHARGE DIAGNOSIS:  left diabetic foot with gas gangrene status post trans metatarsal amputation  SECONDARY DIAGNOSIS:   Past Medical History:  Diagnosis Date  . Arthritis   . CKD (chronic kidney disease), stage III (HCC)   . Diabetes mellitus without complication (HCC)   . Hyperlipidemia   . Hypertension     HOSPITAL COURSE:   William Betty Jonesis a 68 y.o.malewith medical history significant forPVD with left foot gangrene (s/p angioplasty left dorsalis pedis 08/05/2020),IDT2DM, CKD stage III, HTN, HLD, and tobacco use who presents to the ED for evaluation of worsening left foot infection  Gas gangrene of the left fourth and fifth toes and peripheral vascular disease with limb threatening ischemia. - 12/13--Taken to the operating room  for amputation by podiatry by Dr. Excell Seltzer.  -12/14-- s/p  angiogram today with Dr. Gilda Crease vascular surgery PTA of left distal anterior tibial and dorsalis pedis artery. Pt is on IV heparin gtt per Dr Eustace Quail recommendations  --12/15-- patient's left surgical first appears dusky looking with nonviable tissue around. Podiatry has discussed with patient and son regarding possible consideration of BKA. Patient is not keen on getting it done. --12/16-- awaiting Podiatry evaluation of foot and further recs --12/17-- for trans metatarsal amputation today --IV cipro and Po flagyl--given Wound culture growing Morganella and Providencia --12/18--POD #1-- left trans metatarsal amputation. Stump looks okay.  Resume physical therapy. -- Discuss with vascular surgery Dr. Erich Montane to  DC heparin drip. Will resume aspirin and Plavix. --12/19-- change to oral Cipro. Will continue Cipro Flagyl for five more days. Oh okay from podiatry standpoint for discharge. --12/20--awaiting insurance auth. Dressing change 3x weekly, dry dressing and NWB--per dr Ether Griffins per TOC--insurance auth obtained  Acute kidney injury on chronic kidney disease stage IIIa.  -creatinine down from 1.81 down to 1.50.   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  PAD/Diabetic foot --Patient on low-dose glargine insulin 15 units. -- Sugars stable  Essential hypertension - on Norvasc, metoprolol  Hyperlipidemia unspecified - on pravastatin  Tobacco abuse on nicotine patch    Procedures: left fourth and fifth toes amputation, status post angiogram left lower extremity Family communication : son William Melton today Consults : podiatry CODE STATUS: full DVT Prophylaxis : Lovenox Status is: Inpatien  Dispo: The patient is from: Home  Anticipated d/c is to: rehab  Anticipated d/c date is: 3 days  Patient currently is ready for d/c--insurance auth obtained Patient is s/p trans metatarsal amputation. Discharge to rehab--[facility] has [facility]  Dr Ether Griffins aware CONSULTS OBTAINED:    DRUG ALLERGIES:  No Known Allergies  DISCHARGE MEDICATIONS:   Allergies as of 08/25/2020   No Known Allergies     Medication List    STOP taking these medications   indomethacin 50 MG capsule Commonly known as: INDOCIN     TAKE these medications   allopurinol 100 MG tablet Commonly known as: ZYLOPRIM Take 100 mg by mouth daily.   amLODipine 10 MG tablet Commonly known as: NORVASC Take 10 mg by mouth daily.  aspirin EC 81 MG tablet Take 1 tablet (81 mg total) by mouth daily. Swallow whole.   ciprofloxacin 500 MG tablet Commonly known  as: CIPRO Take 1 tablet (500 mg total) by mouth 2 (two) times daily for 4 days.   clopidogrel 75 MG tablet Commonly known as: Plavix Take 1 tablet (75 mg total) by mouth daily.   insulin glargine 100 UNIT/ML injection Commonly known as: Lantus Inject 0.2 mLs (20 Units total) into the skin daily. What changed: how much to take   lovastatin 20 MG tablet Commonly known as: MEVACOR Take 20 mg by mouth at bedtime.   metoprolol succinate 25 MG 24 hr tablet Commonly known as: TOPROL-XL Take 1 tablet (25 mg total) by mouth daily. Start taking on: August 26, 2020   metroNIDAZOLE 500 MG tablet Commonly known as: FLAGYL Take 1 tablet (500 mg total) by mouth every 8 (eight) hours for 4 days.            Discharge Care Instructions  (From admission, onward)         Start     Ordered   08/25/20 0000  Discharge wound care:       Comments: Per Dr Ether Griffins--- dry dressing changes three times a week nonweightbearing left foot   08/25/20 0958          If you experience worsening of your admission symptoms, develop shortness of breath, life threatening emergency, suicidal or homicidal thoughts you must seek medical attention immediately by calling 911 or calling your MD immediately  if symptoms less severe.  You Must read complete instructions/literature along with all the possible adverse reactions/side effects for all the Medicines you take and that have been prescribed to you. Take any new Medicines after you have completely understood and accept all the possible adverse reactions/side effects.   Please note  You were cared for by a hospitalist during your hospital stay. If you have any questions about your discharge medications or the care you received while you were in the hospital after you are discharged, you can call the unit and asked to speak with the hospitalist on call if the hospitalist that took care of you is not available. Once you are discharged, your primary care  physician will handle any further medical issues. Please note that NO REFILLS for any discharge medications will be authorized once you are discharged, as it is imperative that you return to your primary care physician (or establish a relationship with a primary care physician if you do not have one) for your aftercare needs so that they can reassess your need for medications and monitor your lab values. Today   SUBJECTIVE     VITAL SIGNS:  Blood pressure (!) 145/78, pulse 76, temperature 97.9 F (36.6 C), resp. rate 18, height 6' (1.829 m), weight 86.2 kg, SpO2 100 %.  I/O:    Intake/Output Summary (Last 24 hours) at 08/25/2020 1000 Last data filed at 08/25/2020 0504 Gross per 24 hour  Intake 480 ml  Output 1650 ml  Net -1170 ml    PHYSICAL EXAMINATION:   GENERAL:  69 y.o.-year-old patient lying in the bed with no acute distress.  LUNGS: Normal breath sounds bilaterally, no wheezing, rales, rhonchi. No use of accessory muscles of respiration.  CARDIOVASCULAR: S1, S2 normal. No murmurs, rubs, or gallops.  ABDOMEN: Soft, nontender, nondistended. Bowel sounds present. No organomegaly or mass.  EXTREMITIES: from 08/20/2020  08/21/20   08/23/2020 08/24/2020     PSYCHIATRIC:  patient  is alert and oriented x 3.  SKIN: as above  DATA REVIEW:   CBC  Recent Labs  Lab 08/23/20 0449  WBC 12.6*  HGB 10.6*  HCT 33.6*  PLT 378    Chemistries  Recent Labs  Lab 08/25/20 0224  NA 134*  K 3.6  CL 105  CO2 23  GLUCOSE 159*  BUN 16  CREATININE 1.38*  CALCIUM 8.2*  MG 2.1    Microbiology Results   Recent Results (from the past 240 hour(s))  Blood culture (routine x 2)     Status: None   Collection Time: 08/16/20  7:40 PM   Specimen: BLOOD RIGHT HAND  Result Value Ref Range Status   Specimen Description BLOOD RIGHT HAND  Final   Special Requests   Final    BOTTLES DRAWN AEROBIC AND ANAEROBIC Blood Culture adequate volume   Culture   Final    NO GROWTH 5  DAYS Performed at [facility], [address] [place], [place] [address]    Report Status 08/21/2020 FINAL  Final  Blood culture (routine x 2)     Status: None   Collection Time: 08/16/20  7:40 PM   Specimen: Left Antecubital; Blood  Result Value Ref Range Status   Specimen Description LEFT ANTECUBITAL  Final   Special Requests   Final    BOTTLES DRAWN AEROBIC ONLY Blood Culture adequate volume   Culture   Final    NO GROWTH 5 DAYS Performed at [facility], [address] [place], [place] [address]    Report Status 08/21/2020 FINAL  Final  Resp Panel by RT-PCR (Flu A&B, Covid) Nasopharyngeal Swab     Status: None   Collection Time: 08/16/20  7:40 PM   Specimen: Nasopharyngeal Swab; Nasopharyngeal(NP) swabs in vial transport medium  Result Value Ref Range Status   SARS Coronavirus 2 by RT PCR NEGATIVE NEGATIVE Final    Comment: (NOTE) SARS-CoV-2 target nucleic acids are NOT DETECTED.  The SARS-CoV-2 RNA is generally detectable in upper respiratory specimens during the acute phase of infection. The lowest concentration of SARS-CoV-2 viral copies this assay can detect is 138 copies/mL. A negative result does not preclude SARS-Cov-2 infection and should not be used as the sole basis for treatment or other patient management decisions. A negative result may occur with  improper specimen collection/handling, submission of specimen other than nasopharyngeal swab, presence of viral mutation(s) within the areas targeted by this assay, and inadequate number of viral copies(<138 copies/mL). A negative result must be combined with clinical observations, patient history, and epidemiological information. The expected result is Negative.  Fact Sheet for Patients:  BloggerCourse.com  Fact Sheet for Healthcare Providers:  SeriousBroker.it  This test is no t yet approved or cleared by the [place] FDA and  has  been authorized for detection and/or diagnosis of SARS-CoV-2 by FDA under an Emergency Use Authorization (EUA). This EUA will remain  in effect (meaning this test can be used) for the duration of the COVID-19 declaration under Section 564(b)(1) of the Act, 21 U.S.C.section 360bbb-3(b)(1), unless the authorization is terminated  or revoked sooner.       Influenza A by PCR NEGATIVE NEGATIVE Final   Influenza B by PCR NEGATIVE NEGATIVE Final    Comment: (NOTE) The Xpert Xpress SARS-CoV-2/FLU/RSV plus assay is intended as an aid in the diagnosis of influenza from Nasopharyngeal swab specimens and should not be used as a sole basis for treatment. Nasal washings and aspirates are unacceptable for  Xpert Xpress SARS-CoV-2/FLU/RSV testing.  Fact Sheet for Patients: BloggerCourse.com  Fact Sheet for Healthcare Providers: SeriousBroker.it  This test is not yet approved or cleared by the [place] FDA and has been authorized for detection and/or diagnosis of SARS-CoV-2 by FDA under an Emergency Use Authorization (EUA). This EUA will remain in effect (meaning this test can be used) for the duration of the COVID-19 declaration under Section 564(b)(1) of the Act, 21 U.S.C. section 360bbb-3(b)(1), unless the authorization is terminated or revoked.  Performed at [facility], [address] [place], [place] [address]   Surgical pcr screen     Status: None   Collection Time: 08/17/20  5:30 PM   Specimen: Nasal Mucosa; Nasal Swab  Result Value Ref Range Status   MRSA, PCR NEGATIVE NEGATIVE Final   Staphylococcus aureus NEGATIVE NEGATIVE Final    Comment: (NOTE) The Xpert SA Assay (FDA approved for NASAL specimens in patients 59 years of age and older), is one component of a comprehensive surveillance program. It is not intended to diagnose infection nor to guide or monitor treatment. Performed at [facility], [address] [place], [place] [address]   Aerobic/Anaerobic Culture (surgical/deep wound)     Status: None   Collection Time: 08/18/20  5:28 PM   Specimen: Wound  Result Value Ref Range Status   Specimen Description WOUND  Final   Special Requests LEFT FOOT SPEC A  Final   Gram Stain   Final    FEW WBC PRESENT, PREDOMINANTLY PMN FEW GRAM NEGATIVE RODS    Culture   Final    ABUNDANT MORGANELLA MORGANII ABUNDANT PROVIDENCIA RETTGERI ABUNDANT BACTEROIDES FRAGILIS BETA LACTAMASE POSITIVE WITHIN MIXED CULTURE Performed at [facility], [address], [place], [place] [address]    Report Status 08/22/2020 FINAL  Final   Organism ID, Bacteria MORGANELLA MORGANII  Final   Organism ID, Bacteria PROVIDENCIA RETTGERI  Final      Susceptibility   Morganella morganii - MIC*    AMPICILLIN >=32 RESISTANT Resistant     CEFAZOLIN >=64 RESISTANT Resistant     CEFTAZIDIME <=1 SENSITIVE Sensitive     CIPROFLOXACIN <=0.25 SENSITIVE Sensitive     GENTAMICIN <=1 SENSITIVE Sensitive     IMIPENEM 1 SENSITIVE Sensitive     TRIMETH/SULFA <=20 SENSITIVE Sensitive     AMPICILLIN/SULBACTAM >=32 RESISTANT Resistant     PIP/TAZO <=4 SENSITIVE Sensitive     * ABUNDANT MORGANELLA MORGANII   Providencia rettgeri - MIC*    AMPICILLIN RESISTANT Resistant     CEFAZOLIN >=64 RESISTANT Resistant     CEFEPIME <=0.12 SENSITIVE Sensitive     CEFTAZIDIME <=1 SENSITIVE Sensitive     CEFTRIAXONE <=0.25 SENSITIVE Sensitive     CIPROFLOXACIN <=0.25 SENSITIVE Sensitive     GENTAMICIN <=1 SENSITIVE Sensitive     IMIPENEM 2 SENSITIVE Sensitive     TRIMETH/SULFA <=20 SENSITIVE Sensitive     AMPICILLIN/SULBACTAM <=2 SENSITIVE Sensitive     PIP/TAZO <=4 SENSITIVE Sensitive     * ABUNDANT PROVIDENCIA RETTGERI  Surgical pcr screen     Status: None   Collection Time: 08/21/20  6:54 PM   Specimen: Nasal Mucosa; Nasal Swab  Result Value Ref Range Status   MRSA, PCR NEGATIVE NEGATIVE Final   Staphylococcus aureus  NEGATIVE NEGATIVE Final    Comment: (NOTE) The Xpert SA Assay (FDA approved for NASAL specimens in patients 45 years of age and older), is one component of a comprehensive surveillance program. It is  not intended to diagnose infection nor to guide or monitor treatment. Performed at [facility], [address] [place], [place] [address]     RADIOLOGY:  No results found.   CODE STATUS:     Code Status Orders  (From admission, onward)         Start     Ordered   08/16/20 1927  Full code  Continuous        08/16/20 1928        Code Status History    Date Active Date Inactive Code Status Order ID Comments User Context   08/05/2020 0945 08/05/2020 1627 Full Code [id number]  Schnier, Latina CraverGregory G, MD Inpatient   Advance Care Planning Activity       TOTAL TIME TAKING CARE OF THIS PATIENT: **35 minutes.    Enedina FinnerSona Kelbie Moro M.D  [facility]    CC: Primary care physician; Altamease OilerHarris, Meredith L, FNP

## 2020-08-25 NOTE — Progress Notes (Signed)
Mobility Specialist - Progress Note   08/25/20 1114  Mobility  Activity  (seated exercises)  Level of Assistance Standby assist, set-up cues, supervision of patient - no hands on  Assistive Device None  Mobility Response Tolerated well  Mobility performed by Mobility specialist  $Mobility charge 1 Mobility    Pt sitting on recliner upon arrival. Pt only agreeable to seated exercises. Pt performed: ankle pumps x 10, kicks x 10, and marches x 10. Noted ROM limited by weakness and pain (3/10) in LLE. Overall, pt tolerated session well. Pt remained sitting on recliner at the end of session. All needs placed in reach.    Conna Terada Mobility Specialist  08/25/20, 11:17 AM

## 2020-08-25 NOTE — Discharge Instructions (Signed)
Nonweightbearing on left foot patient to follow-up with Dr. Ether Griffins in 10 days to two weeks dressing change per instruction-- dry dressing three times a week

## 2020-08-25 NOTE — Progress Notes (Signed)
Physical Therapy Treatment Patient Details Name: William Melton MRN: [id number] DOB: [birth year] Today's Date: 08/25/2020    History of Present Illness Pt is a 69 yo male with a PMH that includes CKD, DM, HTN, and PVD.  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    PT Comments    Pt agrees to session today.  While initiating exercises discovered external cath was off and pt was laying in saturated bed.  Did not alert staff that he was wet despite being checked on by RN several times.  She entered room just after I did.  Education provided regarding alerting staff as he said he was aware of being soiled in regards to skin integrity issues.  Pt was assisted to EOB with encouragement to complete on his own.  Pt put his arm out "Pull me up" but was unable to do so on his own with cues.  Sitting EOB for bathing with cues to do what he could on his own.  Pt with post lean and cues to correct.  Assisted for back and bottom while standing.  Stood with min a x 1 with post op shoe donned.  Stood with min a x 1 and was able to stand for care then transfer to recliner at bedside.  Pt with difficulty maintaining NWB status and unable to take any true steps and more of a shuffle/pivot to chair.  Remained in recliner. Pt encouraged to sit longer today as he asked to get back to bed quickly on Saturday per nursing. Encouraged several times to remain up for 2-3 hours for lunch.  When questioned how long to stay up before leaving he hesitated and said "1 hour?".     Follow Up Recommendations  SNF;Supervision for mobility/OOB     Equipment Recommendations  Rolling walker with 5" wheels;3in1 (PT)    Recommendations for Other Services       Precautions / Restrictions Precautions Precautions: Fall Restrictions Weight Bearing Restrictions:  Yes LLE Weight Bearing: Non weight bearing    Mobility  Bed Mobility Overal bed mobility: Needs Assistance Bed Mobility: Supine to Sit     Supine to sit: Min guard        Transfers Overall transfer level: Needs assistance Equipment used: Rolling walker (2 wheeled) Transfers: Sit to/from Stand Sit to Stand: Min assist;From elevated surface            Ambulation/Gait Ambulation/Gait assistance: Editor, commissioning (Feet): 2 Feet Assistive device: Rolling walker (2 wheeled) Gait Pattern/deviations: Shuffle Gait velocity: decreased   General Gait Details: unable to take any true hop steps   Stairs             Wheelchair Mobility    Modified Rankin (Stroke Patients Only)       Balance Overall balance assessment: Needs assistance Sitting-balance support: Feet supported Sitting balance-Leahy Scale: Fair Sitting balance - Comments: verbal cues to correct posture due to post lean   Standing balance support: Bilateral upper extremity supported;During functional activity Standing balance-Leahy Scale: Poor Standing balance comment: Physical assist required for stability during standing activities                            Cognition Arousal/Alertness: Awake/alert Behavior During Therapy: WFL for tasks assessed/performed;Flat affect Overall Cognitive Status: No family/caregiver  present to determine baseline cognitive functioning                                 General Comments: some confusion and memory deficits noted      Exercises Total Joint Exercises Ankle Circles/Pumps: AROM;Both;10 reps;5 reps (manual resistance on the RLE) Quad Sets: Strengthening;Both;10 reps;5 reps Gluteal Sets: Strengthening;Both;10 reps;5 reps Hip ABduction/ADduction: Strengthening;Both;10 reps;5 reps (with manual resistance) Straight Leg Raises: Strengthening;Both;10 reps;5 reps Long Arc Quad: Strengthening;Both;10 reps Knee Flexion:  Strengthening;Both;10 reps    General Comments        Pertinent Vitals/Pain Pain Assessment: No/denies pain    Home Living                      Prior Function            PT Goals (current goals can now be found in the care plan section) Progress towards PT goals: Progressing toward goals    Frequency    7X/week      PT Plan Current plan remains appropriate    Co-evaluation              AM-PAC PT "6 Clicks" Mobility   Outcome Measure  Help needed turning from your back to your side while in a flat bed without using bedrails?: A Little Help needed moving from lying on your back to sitting on the side of a flat bed without using bedrails?: A Little Help needed moving to and from a bed to a chair (including a wheelchair)?: A Lot Help needed standing up from a chair using your arms (e.g., wheelchair or bedside chair)?: A Little Help needed to walk in hospital room?: Total Help needed climbing 3-5 steps with a railing? : Total 6 Click Score: 13    End of Session Equipment Utilized During Treatment: Gait belt Activity Tolerance: Patient tolerated treatment well Patient left: in chair;with call bell/phone within reach;with chair alarm set Nurse Communication: Mobility status;Weight bearing status Pain - Right/Left: Left Pain - part of body: Ankle and joints of foot     Time: 3818-2993 PT Time Calculation (min) (ACUTE ONLY): 25 min  Charges:  $Therapeutic Exercise: 8-22 mins $Therapeutic Activity: 8-22 mins                    Danielle Dess, PTA 08/25/20, 10:24 AM

## 2020-08-25 NOTE — Anesthesia Postprocedure Evaluation (Signed)
Anesthesia Post Note  Patient: William Melton  Procedure(s) Performed: TRANSMETATARSAL AMPUTATION-Left Foot (Left Foot)  Patient location during evaluation: PACU Anesthesia Type: General Level of consciousness: awake and alert and oriented Pain management: pain level controlled Vital Signs Assessment: post-procedure vital signs reviewed and stable Respiratory status: spontaneous breathing Cardiovascular status: blood pressure returned to baseline Anesthetic complications: no   No complications documented.   Last Vitals:  Vitals:   08/25/20 1142 08/25/20 1407  BP: 138/75 (!) 144/80  Pulse: 74 76  Resp: 16 18  Temp: 36.4 C 36.8 C  SpO2: 99% 100%    Last Pain:  Vitals:   08/25/20 1407  TempSrc:   PainSc: 2                  Heron Pitcock

## 2020-08-25 NOTE — TOC Transition Note (Addendum)
Transition of Care [facility]) - CM/SW Discharge Note   Patient Details  Name: EDVIN ALBUS MRN: [id number] Date of Birth: 1951-06-25  Transition of Care [facility]) CM/SW Contact:  Chapman Fitch, RN Phone Number: 08/25/2020, 2:18 PM   Clinical Narrative:     Patient to discharge to [facility] today DC info sent in hub  Repeat covid test negative   EMS packet on chart.  EMS transport arranged for 2pm  Patient states that he will update his family   Final next level of care: Skilled Nursing Facility Barriers to Discharge: No Barriers Identified   Patient Goals and CMS Choice        Discharge Placement              Patient chooses bed at: [facility] Patient to be transferred to facility by: EMS Name of family member notified: Patient to update family    Discharge Plan and Services   Discharge Planning Services: CM Consult                                 Social Determinants of Health (SDOH) Interventions     Readmission Risk Interventions No flowsheet data found.

## 2020-08-25 NOTE — Progress Notes (Signed)
[facility]  - [place] [facility]   PATIENT NAME: William Melton    MR#:  [id number]  DATE OF BIRTH:  [birth year]  SUBJECTIVE:  no new complaints.   REVIEW OF SYSTEMS:   Review of Systems  Constitutional: Negative for chills, fever and weight loss.  HENT: Negative for ear discharge, ear pain and nosebleeds.   Eyes: Negative for blurred vision, pain and discharge.  Respiratory: Negative for sputum production, shortness of breath, wheezing and stridor.   Cardiovascular: Negative for chest pain, palpitations, orthopnea and PND.  Gastrointestinal: Negative for abdominal pain, diarrhea, nausea and vomiting.  Genitourinary: Negative for frequency and urgency.  Musculoskeletal: Negative for back pain and joint pain.  Neurological: Negative for sensory change, speech change, focal weakness and weakness.  Psychiatric/Behavioral: Negative for depression and hallucinations. The patient is nervous/anxious.    Tolerating Diet: yes Tolerating PT:  rehab  DRUG ALLERGIES:  No Known Allergies  VITALS:  Blood pressure (!) 145/78, pulse 76, temperature 97.9 F (36.6 C), resp. rate 18, height 6' (1.829 m), weight 86.2 kg, SpO2 100 %.  PHYSICAL EXAMINATION:   Physical Exam  GENERAL:  69 y.o.-year-old patient lying in the bed with no acute distress.  LUNGS: Normal breath sounds bilaterally, no wheezing, rales, rhonchi. No use of accessory muscles of respiration.  CARDIOVASCULAR: S1, S2 normal. No murmurs, rubs, or gallops.  ABDOMEN: Soft, nontender, nondistended. Bowel sounds present. No organomegaly or mass.  EXTREMITIES: from 08/20/2020  08/21/20   08/23/2020 08/24/2020     PSYCHIATRIC:  patient is alert and oriented x 3.  SKIN: as above  LABORATORY PANEL:  CBC Recent Labs  Lab 08/23/20 0449  WBC 12.6*  HGB 10.6*  HCT 33.6*  PLT 378    Chemistries  Recent Labs  Lab 08/25/20 0224  NA 134*  K 3.6  CL 105  CO2 23  GLUCOSE 159*  BUN 16  CREATININE  1.38*  CALCIUM 8.2*  MG 2.1   Cardiac Enzymes No results for input(s): TROPONINI in the last 168 hours. RADIOLOGY:  No results found. ASSESSMENT AND PLAN:  William Melton is a 69 y.o. 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 - 12/13--Taken to the operating room  for amputation by podiatry by Dr. Excell Seltzer.  - 12/14-- s/p  angiogram today with Dr. Gilda Crease vascular surgery PTA of left distal anterior tibial and dorsalis pedis artery. Pt is on IV heparin gtt per Dr Eustace Quail recommendations   --12/15-- patient's left surgical first appears dusky looking with nonviable tissue around. Podiatry has discussed with patient and son regarding possible consideration of BKA. Patient is not keen on getting it done. --12/16-- awaiting Podiatry evaluation of foot and further recs --12/17-- for trans metatarsal amputation today --IV cipro and Po flagyl--given Wound culture growing Morganella and Providencia --12/18--POD #1-- left trans metatarsal amputation. Stump looks okay. Resume physical therapy. -- Discuss with vascular surgery Dr. Erich Montane to  DC heparin drip. Will resume aspirin and Plavix. --12/19-- change to oral Cipro. Will continue Cipro Flagyl for five more days. Oh okay from podiatry standpoint for discharge. --12/20--awaiting insurance auth. Dressing change 3x weekly, dry dressing and NWB--per dr fowler  Acute kidney injury on chronic kidney disease stage  IIIa.   -creatinine down from 1.81 down to 1.50.   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   PAD/Diabetic foot --Patient on low-dose glargine insulin 15 units. -- Sugars stable  Essential  hypertension - on Norvasc, metoprolol  Hyperlipidemia unspecified - on pravastatin  Tobacco abuse on nicotine patch    Procedures: left fourth and fifth toes amputation, status post angiogram left lower extremity Family communication : son William Melton  Consults : podiatry CODE STATUS: full DVT Prophylaxis : Lovenox Status is: Inpatien  Dispo: The patient is from: Home              Anticipated d/c is to: rehab              Anticipated d/c date is: 3 days              Patient currently is ready for d/c--waiting insurance auth Patient is s/p trans metatarsal amputation. Discharge to rehab when insurance authorization approved. [facility] has [facility]  Dr Ether Griffins aware  TOTAL TIME TAKING CARE OF THIS PATIENT: 20 minutes.  >50% time spent on counselling and coordination of care  Note: This dictation was prepared with Dragon dictation along with smaller phrase technology. Any transcriptional errors that result from this process are unintentional.  Enedina Finner M.D    [facility]   CC: Primary care physician; Altamease Oiler, FNPPatient ID: William Melton, male   DOB: [birth year], 69 y.o.   MRN: [id number]

## 2020-08-26 DIAGNOSIS — Z720 Tobacco use: Secondary | ICD-10-CM

## 2020-08-26 DIAGNOSIS — Z89512 Acquired absence of left leg below knee: Secondary | ICD-10-CM | POA: Insufficient documentation

## 2020-08-26 DIAGNOSIS — Z89422 Acquired absence of other left toe(s): Secondary | ICD-10-CM | POA: Insufficient documentation

## 2020-08-26 DIAGNOSIS — Z794 Long term (current) use of insulin: Secondary | ICD-10-CM | POA: Insufficient documentation

## 2020-08-26 DIAGNOSIS — I739 Peripheral vascular disease, unspecified: Secondary | ICD-10-CM

## 2020-08-26 LAB — SURGICAL PATHOLOGY

## 2020-09-03 ENCOUNTER — Ambulatory Visit (INDEPENDENT_AMBULATORY_CARE_PROVIDER_SITE_OTHER): Payer: Medicare HMO | Admitting: Nurse Practitioner

## 2020-09-03 ENCOUNTER — Encounter (INDEPENDENT_AMBULATORY_CARE_PROVIDER_SITE_OTHER): Payer: Medicare HMO

## 2020-09-15 ENCOUNTER — Ambulatory Visit (INDEPENDENT_AMBULATORY_CARE_PROVIDER_SITE_OTHER): Payer: Medicare HMO | Admitting: Vascular Surgery

## 2020-09-18 ENCOUNTER — Other Ambulatory Visit: Payer: Self-pay

## 2020-09-18 ENCOUNTER — Encounter (INDEPENDENT_AMBULATORY_CARE_PROVIDER_SITE_OTHER): Payer: Self-pay | Admitting: Nurse Practitioner

## 2020-09-18 ENCOUNTER — Ambulatory Visit (INDEPENDENT_AMBULATORY_CARE_PROVIDER_SITE_OTHER): Payer: Medicare HMO | Admitting: Nurse Practitioner

## 2020-09-18 VITALS — BP 134/74 | HR 77 | Resp 16 | Wt 175.0 lb

## 2020-09-18 DIAGNOSIS — I70262 Atherosclerosis of native arteries of extremities with gangrene, left leg: Secondary | ICD-10-CM

## 2020-09-18 DIAGNOSIS — I10 Essential (primary) hypertension: Secondary | ICD-10-CM | POA: Diagnosis not present

## 2020-09-18 DIAGNOSIS — E1152 Type 2 diabetes mellitus with diabetic peripheral angiopathy with gangrene: Secondary | ICD-10-CM | POA: Diagnosis not present

## 2020-09-18 DIAGNOSIS — Z794 Long term (current) use of insulin: Secondary | ICD-10-CM | POA: Insufficient documentation

## 2020-09-18 DIAGNOSIS — M109 Gout, unspecified: Secondary | ICD-10-CM | POA: Insufficient documentation

## 2020-09-18 NOTE — Progress Notes (Signed)
 Subjective:    [name], male    DOB: 06/14/1951, 70 y.o.   MRN: [id number] Chief Complaint  Patient presents with  . Follow-up    Ref fowler gangrene of foot,consult for bka    The patient presents today to evaluation regarding necrotic left transmetatarsal amputation.  The patient has underwent multiple revascularizations and in reviewing his previous revascularizations the patient had adequate ability for wound healing.  He currently denies any fever, chills, nausea, vomiting or diarrhea.  He denies any chest pain or shortness of breath.  The necrotic area is dry with no moist areas.  The patient does note that the wound is painful as well.   Review of Systems  Skin: Positive for wound.  Neurological: Positive for weakness.  All other systems reviewed and are negative.      Objective:   Physical Exam Vitals reviewed.  HENT:     Head: Normocephalic.  Cardiovascular:     Rate and Rhythm: Normal rate.  Pulmonary:     Effort: Pulmonary effort is normal.  Musculoskeletal:        General: Signs of injury present.     Left Lower Extremity: Left leg is amputated below ankle.  Skin:    General: Skin is dry.     Comments: Necrotic at distal portion of transmetatarsal amputation  Neurological:     Mental Status: He is alert and oriented to person, place, and time.     Motor: Weakness present.     Gait: Gait abnormal.  Psychiatric:        Mood and Affect: Mood normal.        Behavior: Behavior normal.        Thought Content: Thought content normal.        Judgment: Judgment normal.     BP 134/74 (BP Location: Right Arm)   Pulse 77   Resp 16   Wt 175 lb (79.4 kg)   BMI 23.73 kg/m   Past Medical History:  Diagnosis Date  . Arthritis   . CKD (chronic kidney disease), stage III (HCC)   . Diabetes mellitus without complication (HCC)   . Hyperlipidemia   . Hypertension     Social History   Socioeconomic History  . Marital status: Widowed     Spouse name: Not on file  . Number of children: 1  . Years of education: Not on file  . Highest education level: Not on file  Occupational History  . Occupation: Retired     Comment: Material handler  Tobacco Use  . Smoking status: Former Smoker    Packs/day: 1.00    Types: Cigarettes  . Smokeless tobacco: Never Used  Vaping Use  . Vaping Use: Never used  Substance and Sexual Activity  . Alcohol use: Yes    Alcohol/week: 4.0 standard drinks    Types: 4 Cans of beer per week  . Drug use: Never  . Sexual activity: Not on file  Other Topics Concern  . Not on file  Social History Narrative  . Not on file   Social Determinants of Health   Financial Resource Strain: Not on file  Food Insecurity: Not on file  Transportation Needs: Not on file  Physical Activity: Not on file  Stress: Not on file  Social Connections: Not on file  Intimate Partner Violence: Not on file    Past Surgical History:  Procedure Laterality Date  . AMPUTATION Left 08/18/2020   Procedure: PARTIAL AMPUTATION  OF   4TH AND 5TH RAY;  Surgeon: Baker, Andrew, DPM;  Location: [facility] ORS;  Service: Podiatry;  Laterality: Left;  . LOWER EXTREMITY ANGIOGRAPHY Left 08/05/2020   Procedure: LOWER EXTREMITY ANGIOGRAPHY;  Surgeon: Schnier, Gregory G, MD;  Location: [facility]VASIVE CV LAB;  Service: Cardiovascular;  Laterality: Left;  . LOWER EXTREMITY ANGIOGRAPHY Left 08/19/2020   Procedure: Lower Extremity Angiography;  Surgeon: Schnier, Gregory G, MD;  Location: [facility] INVAS[facility] CV LAB;  Service: Cardiovascular;  Laterality: Left;  . TRANSMETATARSAL AMPUTATION Left 08/22/2020   Procedure: TRANSMETATARSAL AMPUTATION-Left Foot;  Surgeon: Fowler, Justin, DPM;  Location: [facility] ORS;  Service: Podiatry;  Laterality: Left;    Family History  Problem Relation Age of Onset  . Heart disease Mother   . COPD Father   . Hyperlipidemia Father     No Known Allergies  CBC Latest Ref Rng & Units 08/23/2020 08/22/2020 08/21/2020  WBC  4.0 - 10.5 K/uL 12.6(H) 12.4(H) 11.1(H)  Hemoglobin 13.0 - 17.0 g/dL 10.6(L) 11.1(L) 10.6(L)  Hematocrit 39.0 - 52.0 % 33.6(L) 34.9(L) 32.7(L)  Platelets 150 - 400 K/uL 378 396 391      CMP     Component Value Date/Time   NA 134 (L) 08/25/2020 0224   K 3.6 08/25/2020 0224   CL 105 08/25/2020 0224   CO2 23 08/25/2020 0224   GLUCOSE 159 (H) 08/25/2020 0224   BUN 16 08/25/2020 0224   CREATININE 1.38 (H) 08/25/2020 0224   CALCIUM 8.2 (L) 08/25/2020 0224   PROT 7.8 08/16/2020 1721   ALBUMIN 1.9 (L) 08/22/2020 0443   AST 16 08/16/2020 1721   ALT 11 08/16/2020 1721   ALKPHOS 72 08/16/2020 1721   BILITOT 1.3 (H) 08/16/2020 1721   GFRNONAA 56 (L) 08/25/2020 0224     No results found.     Assessment & Plan:   1. Atherosclerosis of native artery of left lower extremity with gangrene (HCC) After the patient's multiple revascularizations he should have had adequate perfusion to be able to heal his wounds however it is evident that the entire transmetatarsal amputation has become necrotic.  At this point, the present transmetatarsal amputation will not heal and so the best course of action would be a left below-knee amputation.  I discussed the importance of being able to keep his knee straight post amputation as well as the importance of working with physical therapy.  Discussed the possibility of contracture of the patient does not keep his knee straight which would ultimately result in a revision of his stump to an above-knee amputation.  The patient understands.  This was also discussed separately with the son via phone, and he also understands.  The risk, benefits and alternatives were discussed with the patient.  At this time he wishes to proceed with a left below-knee amputation.  We will have the patient follow-up in our office after his procedure.  2. Essential hypertension Continue antihypertensive medications as already ordered, these medications have been reviewed and there are no  changes at this time.   3. Type 2 diabetes mellitus with diabetic peripheral angiopathy with gangrene, unspecified whether long term insulin use (HCC) Continue hypoglycemic medications as already ordered, these medications have been reviewed and there are no changes at this time.  Hgb A1C to be monitored as already arranged by primary service   Approximately 54 minutes spent in the care of this patient including reviewing studies, discussing case with patient face-to-face office visit as well as phone conversations with patient's son at his request. Current Outpatient   Medications on File Prior to Visit  Medication Sig Dispense Refill  . Alcohol Swabs 70 % PADS to check fasting blood sugar    . allopurinol (ZYLOPRIM) 100 MG tablet Take 100 mg by mouth daily.     . amLODipine (NORVASC) 10 MG tablet Take 10 mg by mouth daily.     . aspirin EC 81 MG tablet Take 1 tablet (81 mg total) by mouth daily. Swallow whole. 150 tablet 2  . Blood Glucose Monitoring Suppl (TRUE METRIX AIR GLUCOSE METER) w/Device KIT to check fasting blood sugar    . clopidogrel (PLAVIX) 75 MG tablet Take 1 tablet (75 mg total) by mouth daily. 30 tablet 4  . insulin glargine (LANTUS) 100 UNIT/ML injection Inject 0.2 mLs (20 Units total) into the skin daily. 10 mL 11  . lovastatin (MEVACOR) 20 MG tablet Take 20 mg by mouth at bedtime.    . metoprolol succinate (TOPROL-XL) 25 MG 24 hr tablet Take 1 tablet (25 mg total) by mouth daily. 30 tablet 0  . nicotine (NICODERM CQ - DOSED IN MG/24 HOURS) 21 mg/24hr patch Place 21 mg onto the skin daily.    . oxyCODONE-acetaminophen (PERCOCET/ROXICET) 5-325 MG tablet Take 1 tablet by mouth every 4 (four) hours as needed for severe pain.     No current facility-administered medications on file prior to visit.    There are no Patient Instructions on file for this visit. No follow-ups on file.   Shalan Neault E Roylee Chaffin, NP   

## 2020-09-18 NOTE — H&P (View-Only) (Signed)
 Subjective:    [name], male    DOB: 03/28/1951, 69 y.o.   MRN: [id number] Chief Complaint  Patient presents with  . Follow-up    Ref fowler gangrene of foot,consult for bka    The patient presents today to evaluation regarding necrotic left transmetatarsal amputation.  The patient has underwent multiple revascularizations and in reviewing his previous revascularizations the patient had adequate ability for wound healing.  He currently denies any fever, chills, nausea, vomiting or diarrhea.  He denies any chest pain or shortness of breath.  The necrotic area is dry with no moist areas.  The patient does note that the wound is painful as well.   Review of Systems  Skin: Positive for wound.  Neurological: Positive for weakness.  All other systems reviewed and are negative.      Objective:   Physical Exam Vitals reviewed.  HENT:     Head: Normocephalic.  Cardiovascular:     Rate and Rhythm: Normal rate.  Pulmonary:     Effort: Pulmonary effort is normal.  Musculoskeletal:        General: Signs of injury present.     Left Lower Extremity: Left leg is amputated below ankle.  Skin:    General: Skin is dry.     Comments: Necrotic at distal portion of transmetatarsal amputation  Neurological:     Mental Status: He is alert and oriented to person, place, and time.     Motor: Weakness present.     Gait: Gait abnormal.  Psychiatric:        Mood and Affect: Mood normal.        Behavior: Behavior normal.        Thought Content: Thought content normal.        Judgment: Judgment normal.     BP 134/74 (BP Location: Right Arm)   Pulse 77   Resp 16   Wt 175 lb (79.4 kg)   BMI 23.73 kg/m   Past Medical History:  Diagnosis Date  . Arthritis   . CKD (chronic kidney disease), stage III (HCC)   . Diabetes mellitus without complication (HCC)   . Hyperlipidemia   . Hypertension     Social History   Socioeconomic History  . Marital status: Widowed     Spouse name: Not on file  . Number of children: 1  . Years of education: Not on file  . Highest education level: Not on file  Occupational History  . Occupation: Retired     Comment: Material handler  Tobacco Use  . Smoking status: Former Smoker    Packs/day: 1.00    Types: Cigarettes  . Smokeless tobacco: Never Used  Vaping Use  . Vaping Use: Never used  Substance and Sexual Activity  . Alcohol use: Yes    Alcohol/week: 4.0 standard drinks    Types: 4 Cans of beer per week  . Drug use: Never  . Sexual activity: Not on file  Other Topics Concern  . Not on file  Social History Narrative  . Not on file   Social Determinants of Health   Financial Resource Strain: Not on file  Food Insecurity: Not on file  Transportation Needs: Not on file  Physical Activity: Not on file  Stress: Not on file  Social Connections: Not on file  Intimate Partner Violence: Not on file    Past Surgical History:  Procedure Laterality Date  . AMPUTATION Left 08/18/2020   Procedure: PARTIAL AMPUTATION  OF   4TH AND 5TH RAY;  Surgeon: Baker, Andrew, DPM;  Location: [facility] ORS;  Service: Podiatry;  Laterality: Left;  . LOWER EXTREMITY ANGIOGRAPHY Left 08/05/2020   Procedure: LOWER EXTREMITY ANGIOGRAPHY;  Surgeon: Schnier, Gregory G, MD;  Location: [facility];  Service: Cardiovascular;  Laterality: Left;  . LOWER EXTREMITY ANGIOGRAPHY Left 08/19/2020   Procedure: Lower Extremity Angiography;  Surgeon: Schnier, Gregory G, MD;  Location: [facility];  Service: Cardiovascular;  Laterality: Left;  . TRANSMETATARSAL AMPUTATION Left 08/22/2020   Procedure: TRANSMETATARSAL AMPUTATION-Left Foot;  Surgeon: Fowler, Justin, DPM;  Location: [facility] ORS;  Service: Podiatry;  Laterality: Left;    Family History  Problem Relation Age of Onset  . Heart disease Mother   . COPD Father   . Hyperlipidemia Father     No Known Allergies  CBC Latest Ref Rng & Units 08/23/2020 08/22/2020 08/21/2020  WBC  4.0 - 10.5 K/uL 12.6(H) 12.4(H) 11.1(H)  Hemoglobin 13.0 - 17.0 g/dL 10.6(L) 11.1(L) 10.6(L)  Hematocrit 39.0 - 52.0 % 33.6(L) 34.9(L) 32.7(L)  Platelets 150 - 400 K/uL 378 396 391      CMP     Component Value Date/Time   NA 134 (L) 08/25/2020 0224   K 3.6 08/25/2020 0224   CL 105 08/25/2020 0224   CO2 23 08/25/2020 0224   GLUCOSE 159 (H) 08/25/2020 0224   BUN 16 08/25/2020 0224   CREATININE 1.38 (H) 08/25/2020 0224   CALCIUM 8.2 (L) 08/25/2020 0224   PROT 7.8 08/16/2020 1721   ALBUMIN 1.9 (L) 08/22/2020 0443   AST 16 08/16/2020 1721   ALT 11 08/16/2020 1721   ALKPHOS 72 08/16/2020 1721   BILITOT 1.3 (H) 08/16/2020 1721   GFRNONAA 56 (L) 08/25/2020 0224     No results found.     Assessment & Plan:   1. Atherosclerosis of native artery of left lower extremity with gangrene (HCC) After the patient's multiple revascularizations he should have had adequate perfusion to be able to heal his wounds however it is evident that the entire transmetatarsal amputation has become necrotic.  At this point, the present transmetatarsal amputation will not heal and so the best course of action would be a left below-knee amputation.  I discussed the importance of being able to keep his knee straight post amputation as well as the importance of working with physical therapy.  Discussed the possibility of contracture of the patient does not keep his knee straight which would ultimately result in a revision of his stump to an above-knee amputation.  The patient understands.  This was also discussed separately with the son via phone, and he also understands.  The risk, benefits and alternatives were discussed with the patient.  At this time he wishes to proceed with a left below-knee amputation.  We will have the patient follow-up in our office after his procedure.  2. Essential hypertension Continue antihypertensive medications as already ordered, these medications have been reviewed and there are no  changes at this time.   3. Type 2 diabetes mellitus with diabetic peripheral angiopathy with gangrene, unspecified whether long term insulin use (HCC) Continue hypoglycemic medications as already ordered, these medications have been reviewed and there are no changes at this time.  Hgb A1C to be monitored as already arranged by primary service   Approximately 54 minutes spent in the care of this patient including reviewing studies, discussing case with patient face-to-face office visit as well as phone conversations with patient's son at his request. Current Outpatient   Medications on File Prior to Visit  Medication Sig Dispense Refill  . Alcohol Swabs 70 % PADS to check fasting blood sugar    . allopurinol (ZYLOPRIM) 100 MG tablet Take 100 mg by mouth daily.     . amLODipine (NORVASC) 10 MG tablet Take 10 mg by mouth daily.     . aspirin EC 81 MG tablet Take 1 tablet (81 mg total) by mouth daily. Swallow whole. 150 tablet 2  . Blood Glucose Monitoring Suppl (TRUE METRIX AIR GLUCOSE METER) w/Device KIT to check fasting blood sugar    . clopidogrel (PLAVIX) 75 MG tablet Take 1 tablet (75 mg total) by mouth daily. 30 tablet 4  . insulin glargine (LANTUS) 100 UNIT/ML injection Inject 0.2 mLs (20 Units total) into the skin daily. 10 mL 11  . lovastatin (MEVACOR) 20 MG tablet Take 20 mg by mouth at bedtime.    . metoprolol succinate (TOPROL-XL) 25 MG 24 hr tablet Take 1 tablet (25 mg total) by mouth daily. 30 tablet 0  . nicotine (NICODERM CQ - DOSED IN MG/24 HOURS) 21 mg/24hr patch Place 21 mg onto the skin daily.    . oxyCODONE-acetaminophen (PERCOCET/ROXICET) 5-325 MG tablet Take 1 tablet by mouth every 4 (four) hours as needed for severe pain.     No current facility-administered medications on file prior to visit.    There are no Patient Instructions on file for this visit. No follow-ups on file.   Fallon E Brown, NP   

## 2020-09-19 ENCOUNTER — Telehealth (INDEPENDENT_AMBULATORY_CARE_PROVIDER_SITE_OTHER): Payer: Self-pay

## 2020-09-19 NOTE — Telephone Encounter (Signed)
Spoke with the patient's son to let him know the patient is scheduled with Dr. Gilda Crease for a left BKA on 09/26/20 at [facility]. Pre-op phone call on 09/23/20 between 8-1 pm and covid testing on 09/24/20 between 8-1 pm at the [facility]. Pre-surgical instructions will be mailed to the son's address at [address] [place], [address].

## 2020-09-21 ENCOUNTER — Other Ambulatory Visit (INDEPENDENT_AMBULATORY_CARE_PROVIDER_SITE_OTHER): Payer: Self-pay | Admitting: Nurse Practitioner

## 2020-09-23 ENCOUNTER — Inpatient Hospital Stay
Admission: RE | Admit: 2020-09-23 | Discharge: 2020-09-23 | Disposition: A | Discharge status: Home / Self Care | Payer: Medicare HMO | Source: Ambulatory Visit | Attending: Vascular Surgery | Admitting: Vascular Surgery

## 2020-09-23 NOTE — Patient Instructions (Signed)
Your procedure is scheduled on:   Report to THE FIRST FLOOR REGISTRATION DESK IN THE MEDICAL MALL ON THE MORNING OF SURGERY FIRST, THEN YOU WILL CHECK IN AT THE SURGERY INFORMATION DESK LOCATED OUTSIDE THE SAME DAY SURGERY DEPARTMENT LOCATED ON 2ND FLOOR MEDICAL MALL ENTRANCE. To find out your arrival time please call [phone] between 1PM - 3PM on    Remember: Instructions that are not followed completely may result in serious medical risk, up to and including death, or upon the discretion of your surgeon and anesthesiologist your surgery may need to be rescheduled.     __X__ 1. Do not eat food after midnight the night before your procedure.                 No gum chewing or hard candies. You may drink clear liquids up to 2 hours                 before you are scheduled to arrive for your surgery- DO NOT drink clear                 liquids within 2 hours of the start of your surgery.                 Clear Liquids include:  water, apple juice without pulp, clear carbohydrate                 drink such as Clearfast or Gatorade, Black Coffee or Tea (Do not add                 milk or creamer to coffee or tea).  __X__2.  On the morning of surgery brush your teeth with toothpaste and water, you may rinse your mouth with mouthwash if you wish.  Do not swallow any toothpaste or mouthwash.    __X__ 3.  No Alcohol for 24 hours before or after surgery.  __X__ 4.  Do Not Smoke or use e-cigarettes For 24 Hours Prior to Your Surgery.                 Do not use any chewable tobacco products for at least 6 hours prior to                 surgery.  __X__5.  Notify your doctor if there is any change in your medical condition      (cold, fever, infections).      Do NOT wear jewelry, make-up, hairpins, clips or nail polish. Do NOT wear lotions, powders, or perfumes.  Do NOT shave 48 hours prior to surgery. Men may shave face and neck. Do NOT bring valuables to the hospital.     [facility] is  not responsible for any belongings or valuables.   Contacts, dentures/partials or body piercings may not be worn into surgery. Bring a case for your contacts, glasses or hearing aids, a denture cup will be supplied. Leave your suitcase in the car. After surgery it may be brought to your room.   For patients admitted to the hospital, discharge time is determined by your treatment team.    Patients discharged the day of surgery will not be allowed to drive home.     __X__ Take these medicines the morning of surgery with A SIP OF WATER:     1. allopurinol (ZYLOPRIM)   2. amLODipine (NORVASC)   3. metoprolol succinate (TOPROL-XL)   4. oxyCODONE-acetaminophen (PERCOCET/ROXICET) if needed     __X__ [employer]  Enema (as directed)   __X__ Use CHG Soap/SAGE wipes as directed  __X__ Use inhalers on the day of surgery. Also bring the inhaler with you to the hospital on the morning of surgery.  __X__ Stop metformin/Janumet/Farxiga 2 days prior to surgery    __X__ Take 1/2 of usual insulin dose the night before surgery. No insulin the morning of surgery.   __X__ Stop Blood Thinners Coumadin/Plavix/Xarelto/Pleta/Pradaxa/Eliquis/Effient/Aspirin   __X__ Stop Anti-inflammatories 7 days before surgery such as Advil, Ibuprofen, Motrin, BC or Goodies Powder, Naprosyn, Naproxen, Aleve, Aspirin, Meloxicam. May take Tylenol if needed for pain or discomfort.   __X__Do not start taking any new herbal supplements or vitamins prior to your procedure.  __X__ Stop the following herbal supplements or vitamins:   Wear comfortable clothing (specific to your surgery type) to the hospital.  Plan for stool softeners for home use; pain medications have a tendency to cause constipation. You can also help prevent constipation by eating foods high in fiber such as fruits and vegetables and drinking plenty of fluids as your diet allows.  After surgery, you can prevent lung complications by doing breathing  exercises.Take deep breaths and cough every 1-2 hours. Your doctor may order a device called an Incentive Spirometer to help you take deep breaths.  Please call the Pre-Admissions Testing Department at [phone] if you have any questions about these instructions

## 2020-09-23 NOTE — Pre-Procedure Instructions (Signed)
This patient is a resident at [facility]. I first called to speak with a nurse on Monday 09/22/20 to verify history and relay instructions for his upcomming procedure. At that time I was connected to a nurse that stated she was an Psychiatric nurse and it was her first day on the job, she did not know anything about the scheduled procedure for this patient or receiving any information but that she would text the DON, Lafayette Dragon. She also advised me to call back on Tuesday 09/23/20 in the morning to speak with Lafayette Dragon. I called and asked for Tresa Endo, but was advised that she is not in yet. I left a message with the secretary who assured that Tresa Endo would return the call today when she arrived.   I explained that this patient would need labs drawn. She said that they would be able to obtain a CBC, BMP, PT/PTT at [facility]. Their NP could order them.    A nurse will attempt contact again on Wednesday morning 09/24/20.

## 2020-09-24 ENCOUNTER — Inpatient Hospital Stay: Admission: RE | Admit: 2020-09-24 | Payer: Medicare HMO | Source: Ambulatory Visit

## 2020-09-24 ENCOUNTER — Encounter
Admission: RE | Admit: 2020-09-24 | Discharge: 2020-09-24 | Disposition: A | Discharge status: Home / Self Care | Payer: Medicare HMO | Source: Ambulatory Visit | Attending: Vascular Surgery | Admitting: Vascular Surgery

## 2020-09-24 NOTE — Pre-Procedure Instructions (Signed)
This Clinical research associate spoke with DON of [facility] regarding patient medications and to review pre admission instructions for surgery scheduled for 09/26/20. Per DON- Lafayette Dragon , patient has not stop taking his Plavix and was in fact administered Plavix today. This writer instructed DON to fax patients medication list to me for reviewing and to complete pre admission instructions . Once instructions are complete , they will be sent to [employer], attention [employer]. This Clinical research associate continues to wait for list of medications.

## 2020-09-24 NOTE — Patient Instructions (Addendum)
Your procedure is scheduled on: 09/26/20- FRIDAY Report to the Registration Desk on the 1st floor of the Medical Mall. To find out your arrival time, please call [phone] between 1PM - 3PM on: 09/25/20  REMEMBER: Instructions that are not followed completely may result in serious medical risk, up to and including death; or upon the discretion of your surgeon and anesthesiologist your surgery may need to be rescheduled.  Do not eat food after midnight the night before surgery.  No gum chewing, lozengers or hard candies.  You may however, drink CLEAR liquids up to 2 hours before you are scheduled to arrive for your surgery. Do not drink anything within 2 hours of your scheduled arrival time.Type 1 and Type 2 diabetics should only drink water.  TAKE THESE MEDICATIONS THE MORNING OF SURGERY WITH A SIP OF WATER: - amLODipine (NORVASC) 10 MG tablet - metoprolol succinate (TOPROL-XL) 25 MG 24 hr  - allopurinol (ZYLOPRIM) 100 MG tablet - oxyCODONE-acetaminophen (PERCOCET/ROXICET) 5-325 MG tablet MAY TAKE AS DIRECTED IF NEEDED.   Insulin glargine (LANTUS) 100 UNIT/ML injection :   IF INSULIN IS GIVEN IN THE AM, DO NOT GIVE THE MORNING OF SURGERY 09/26/20, IF INSULIN IS ADMINISTERED AT BEDTIME, ONLY GIVE 1/2 DOSE THE NIGHT BEFORE SURGERY 09/25/20.  Follow recommendations from Cardiologist, Pulmonologist or PCP regarding stopping Aspirin, Coumadin, Plavix, Eliquis, Pradaxa, or Pletal. LAST DOSE GIVEN 09/24/20.  One week prior to surgery: Stop Anti-inflammatories (NSAIDS) such as Advil, Aleve, Ibuprofen, Motrin, Naproxen, Naprosyn and Aspirin based products such as Excedrin, Goodys Powder, BC Powder. Stop ANY OVER THE COUNTER supplements until after surgery. (However, you may continue taking Vitamin D, Vitamin B, and multivitamin up until the day before surgery.)  No Alcohol for 24 hours before or after surgery.  No Smoking including e-cigarettes for 24 hours prior to surgery.  No chewable  tobacco products for at least 6 hours prior to surgery.  No nicotine patches on the day of surgery.  Do not use any "recreational" drugs for at least a week prior to your surgery.  Please be advised that the combination of cocaine and anesthesia may have negative outcomes, up to and including death. If you test positive for cocaine, your surgery will be cancelled.  On the morning of surgery brush your teeth with toothpaste and water, you may rinse your mouth with mouthwash if you wish. Do not swallow any toothpaste or mouthwash.  Do not wear jewelry, make-up, hairpins, clips or nail polish.  Do not wear lotions, powders, or perfumes.   Do not shave body from the neck down 48 hours prior to surgery just in case you cut yourself which could leave a site for infection.  Also, freshly shaved skin may become irritated if using the CHG soap.  Contact lenses, hearing aids and dentures may not be worn into surgery.  Do not bring valuables to the hospital. [facility] is not responsible for any missing/lost belongings or valuables.   Notify your doctor if there is any change in your medical condition (cold, fever, infection).  Wear comfortable clothing (specific to your surgery type) to the hospital.  Plan for stool softeners for home use; pain medications have a tendency to cause constipation. You can also help prevent constipation by eating foods high in fiber such as fruits and vegetables and drinking plenty of fluids as your diet allows.  After surgery, you can help prevent lung complications by doing breathing exercises.  Take deep breaths and cough every 1-2 hours. Your  doctor may order a device called an Incentive Spirometer to help you take deep breaths. When coughing or sneezing, hold a pillow firmly against your incision with both hands. This is called "splinting." Doing this helps protect your incision. It also decreases belly discomfort.  If you are being admitted to the hospital  overnight, leave your suitcase in the car. After surgery it may be brought to your room.  If you are being discharged the day of surgery, you will not be allowed to drive home. You will need a responsible adult (18 years or older) to drive you home and stay with you that night.   If you are taking public transportation, you will need to have a responsible adult (18 years or older) with you. Please confirm with your physician that it is acceptable to use public transportation.   Please call the Pre-admissions Testing Dept. at [phone] if you have any questions about these instructions.  Visitation Policy:  Patients undergoing a surgery or procedure may have one family member or support person with them as long as that person is not COVID-19 positive or experiencing its symptoms.  That person may remain in the waiting area during the procedure.  Inpatient Visitation:    Visiting hours are 7 a.m. to 8 p.m. Patients will be allowed one visitor. The visitor may change daily. The visitor must pass COVID-19 screenings, use hand sanitizer when entering and exiting the patient's room and wear a mask at all times, including in the patient's room. Patients must also wear a mask when staff or their visitor are in the room. Masking is required regardless of vaccination status. Systemwide, no visitors 17 or younger.

## 2020-09-25 ENCOUNTER — Encounter (INDEPENDENT_AMBULATORY_CARE_PROVIDER_SITE_OTHER): Payer: Medicare HMO

## 2020-09-25 ENCOUNTER — Ambulatory Visit (INDEPENDENT_AMBULATORY_CARE_PROVIDER_SITE_OTHER): Payer: Medicare HMO | Admitting: Vascular Surgery

## 2020-09-25 NOTE — Progress Notes (Signed)
[facility] Perioperative Services: Pre-Admission/Anesthesia Testing     Date: 09/25/20  Name: William Melton MRN:   [id number]  Re: Plans for surgery   Case: [id number] Date: 09/26/20   Procedure: AMPUTATION BELOW KNEE (Left Knee)   Anesthesia type: General   Pre-op diagnosis: GANGRENE   Location: [facility] ORS FOR ANESTHESIA [facility]   Surgeons: Renford Dills, MD    Patient scheduled for the above procedure on 09/26/2020 with Dr. [name]. Patient is in a SNF and we have encountered significant difficulties communicating with them regarding patient's POC. The PAT RN has been in contact with the facility director to request labs, however as of 1545 today results have not been received. Call received from [facility] staff to advise that they have been unable to speak with anyone regarding patient's arrival time for his procedure. I was asked to become involved at this point.    PAT RN directed to call patient's family for assistance. [place], RN was able to speak with the patient's son, who was understandingly upset. Son advised that he would "take care of it" and that someone would be calling us soon from the facility.   Around 1600 today, I received a call from [employer] who identified themselves as only "the nurse". SNF nurse was questioned about the pre-operative labs that were ordered to be done. She stated, "I don't know. I do see no results. If they were done I should have results by now". Nurse was advised that labs could be obtained at the hospital prior to surgery. She was then asked how the patient would be transported to the hospital for his surgery. Initially, I was advised that she didn't know. After some research she noted that there was a "transportation sheet in the book" and patient would be arriving via medical transport service. I had already spoken with [facility] and obtained arrival time of 0700 for a 0940 procedure; will need to be early arrival as  patient will require rapid SARS-CoV-2 (novel coronavirus) testing. This was communicated to nurse as [facility] has been unable to contact. Nurse stated, "we arranged transportation for 1030. With that change, I don't know if they will bring him. They need 24 hours notice". Unsure if family can transport. Nurse to contact medical transport company to determine if earlier pick up for a 0700 arrival to [facility] would be possible. She was provided with my personal cell number for follow up.    Contacted [facility] to make them aware of that labs were not done and that patient potentially has transportation issues. I was forwarded to OR administration Judith Blonder, Charity fundraiser) with whom I discussed the above. Reviewed OR schedule. Discussed option of patient's case being done later in the day. I was directed to speak with attending surgeon.    Message sent to surgeon Gilda Crease, MD) to make him aware of the lab and transportation issue. Asked if case could be moved. I also included office APP Manson Passey, NP-C) to the conversation as scheduler was not in the office today. Received communication from [employer], PA-C at [employer]; case issues discussed/reviewed. I advised PA of my plans to go by SNF to speak with nursing staff after my shift today in efforts to prevent unnecessary cancellation of the patient's procedure tomorrow. Per [place], PA, "we are going to cancel him for tomorrow. Unsure if our outpatient office will be open tomorrow with inclement weather approaching. We will reach out to reschedule him". PA contacted OR to advise that patient's case  will be canceled for tomorrow (09/26/2020).    Attempted several times to contact [facility] to discuss cancellation and POC going forward. I was finally able to speak with patient's nurse at the facility. Nurse advised me that they were able to arrange for transportation to pick patient up in the morning at 0630, allowing for him to make his 0700 arrival time at the hospital. I advised the nurse  that surgeon had decided to cancel patient's case for tomorrow, however in light of the new information regarding his transportation, I would reach back out to vascular team to discuss.    Secure message sent to Dr. Gilda Crease and Geri Seminole, PA-C at [phone] to advise them that facility was able to arrange appropriate transportation. I inquired if patient's case should be placed back on the OR schedule for tomorrow. Return message received from PA that stated, "he has been canceled. Our office will reach out to reschedule".    Following numerous attempts, Psychologist, occupational able to be contacted and was updated on response from surgical team. Patient's case awaiting reschedule at this time. AV&V to contact facility with details regarding rescheduling of procedure.   Quentin Mulling, MSN, APRN, FNP-C, CEN [facility]  Peri-operative Services Nurse Practitioner Phone: [phone] 09/25/20 8:21 PM

## 2020-09-26 ENCOUNTER — Encounter (INDEPENDENT_AMBULATORY_CARE_PROVIDER_SITE_OTHER): Payer: Self-pay

## 2020-09-26 ENCOUNTER — Telehealth: Payer: Self-pay | Admitting: Urgent Care

## 2020-09-26 ENCOUNTER — Telehealth (INDEPENDENT_AMBULATORY_CARE_PROVIDER_SITE_OTHER): Payer: Self-pay

## 2020-09-26 NOTE — Progress Notes (Signed)
°  [facility] Perioperative Services: Pre-Admission/Anesthesia Testing     Date: 09/26/20  Name: William Melton MRN:   [id number]  Re: Anticoagulation therapy and plans for surgery  Patient initially scheduled for surgery on 09/26/2020, however due to medication, laboratory, and transportation issues patient's procedure was canceled.  AV&V has been in contact with patient's  SNF today and he has been rescheduled for LEFT BKA on 09/30/2020 with Dr. [name].  Patient still needs preoperative testing including labs, EKG, and SARS-CoV-2 testing.  In efforts to ensure that everything required for surgery is in place, patient to present to [facility] on 09/29/2020 for the aforementioned testing.  Facility is aware and transportation has been confirmed per facility nurse.  In efforts to ensure that there are no issues with patient's surgical course on 09/30/2020, I called the SNF and spoke to patient's nurse. Nurse stated, "so he needs to stop his Plavix starting today". Unsure if medication was being held as previously ordered. VERBAL orders were provided today,  however to further ensure that care directives were being carried out the following document was prepared, signed, and faxed to patient's SNF to the attention of his primary nurse William Melton) as follows:   PLAN: Will plan on following up on patient care testing on Monday (09/29/2020). Nurse advised to return call to surgeon or myself if questions or concerns arise pertaining to the care of this patient. Nurse appreciative of call and collaboration between facility and [facility]. Spoke with [employer], PA-C to make her aware of efforts being made to ensure an safe and appropriate surgical/anesthetic course.   William Mulling, MSN, APRN, FNP-C, CEN [facility]  Peri-operative Services Nurse Practitioner Phone: [phone] 09/26/20 4:46 PM

## 2020-09-26 NOTE — Telephone Encounter (Signed)
Patient left below knee amputation has been reschedule to 09/30/20 and Covid-19 testing to 09/29/20. I spoke with the patient son Saud and [name] with [facility]. Pre-surgical instructions has been faxed to [facility].

## 2020-09-29 ENCOUNTER — Encounter
Admission: RE | Admit: 2020-09-29 | Discharge: 2020-09-29 | Disposition: A | Discharge status: Home / Self Care | Payer: Medicare HMO | Source: Ambulatory Visit | Attending: Vascular Surgery | Admitting: Vascular Surgery

## 2020-09-29 ENCOUNTER — Other Ambulatory Visit
Admission: RE | Admit: 2020-09-29 | Discharge: 2020-09-29 | Disposition: A | Discharge status: Home / Self Care | Payer: Medicare HMO | Source: Ambulatory Visit | Attending: Vascular Surgery | Admitting: Vascular Surgery

## 2020-09-29 ENCOUNTER — Other Ambulatory Visit: Payer: Self-pay

## 2020-09-29 DIAGNOSIS — Z01810 Encounter for preprocedural cardiovascular examination: Secondary | ICD-10-CM | POA: Diagnosis not present

## 2020-09-29 DIAGNOSIS — R799 Abnormal finding of blood chemistry, unspecified: Secondary | ICD-10-CM | POA: Insufficient documentation

## 2020-09-29 DIAGNOSIS — Z20822 Contact with and (suspected) exposure to covid-19: Secondary | ICD-10-CM | POA: Insufficient documentation

## 2020-09-29 DIAGNOSIS — Z01818 Encounter for other preprocedural examination: Secondary | ICD-10-CM | POA: Insufficient documentation

## 2020-09-29 DIAGNOSIS — Z01812 Encounter for preprocedural laboratory examination: Secondary | ICD-10-CM | POA: Insufficient documentation

## 2020-09-29 LAB — BASIC METABOLIC PANEL
Anion gap: 10 (ref 5–15)
BUN: 24 mg/dL — ABNORMAL HIGH (ref 8–23)
CO2: 24 mmol/L (ref 22–32)
Calcium: 9.2 mg/dL (ref 8.9–10.3)
Chloride: 105 mmol/L (ref 98–111)
Creatinine, Ser: 1.49 mg/dL — ABNORMAL HIGH (ref 0.61–1.24)
GFR, Estimated: 51 mL/min — ABNORMAL LOW (ref >60)
Glucose, Bld: 92 mg/dL (ref 70–99)
Potassium: 2.9 mmol/L — ABNORMAL LOW (ref 3.5–5.1)
Sodium: 139 mmol/L (ref 135–145)

## 2020-09-29 LAB — CBC WITH DIFFERENTIAL/PLATELET
Abs Immature Granulocytes: 0.09 10*3/uL — ABNORMAL HIGH (ref 0.00–0.07)
Basophils Absolute: 0.1 10*3/uL (ref 0.0–0.1)
Basophils Relative: 0 %
Eosinophils Absolute: 0.4 10*3/uL (ref 0.0–0.5)
Eosinophils Relative: 2 %
HCT: 25.4 % — ABNORMAL LOW (ref 39.0–52.0)
Hemoglobin: 7.8 g/dL — ABNORMAL LOW (ref 13.0–17.0)
Immature Granulocytes: 1 %
Lymphocytes Relative: 21 %
Lymphs Abs: 3.1 10*3/uL (ref 0.7–4.0)
MCH: 24.7 pg — ABNORMAL LOW (ref 26.0–34.0)
MCHC: 30.7 g/dL (ref 30.0–36.0)
MCV: 80.4 fL (ref 80.0–100.0)
Monocytes Absolute: 0.9 10*3/uL (ref 0.1–1.0)
Monocytes Relative: 6 %
Neutro Abs: 10.2 10*3/uL — ABNORMAL HIGH (ref 1.7–7.7)
Neutrophils Relative %: 70 %
Platelets: 514 10*3/uL — ABNORMAL HIGH (ref 150–400)
RBC: 3.16 MIL/uL — ABNORMAL LOW (ref 4.22–5.81)
RDW: 16 % — ABNORMAL HIGH (ref 11.5–15.5)
WBC: 14.7 10*3/uL — ABNORMAL HIGH (ref 4.0–10.5)
nRBC: 0 % (ref 0.0–0.2)

## 2020-09-29 LAB — APTT: aPTT: 40 seconds — ABNORMAL HIGH (ref 24–36)

## 2020-09-29 LAB — PROTIME-INR
INR: 1.1 (ref 0.8–1.2)
Prothrombin Time: 14.2 seconds (ref 11.4–15.2)

## 2020-09-29 NOTE — Progress Notes (Addendum)
  [facility] Perioperative Services: Pre-Admission/Anesthesia Testing  Abnormal Lab Notification   Date: 09/29/20  Name: William Melton MRN:   [id number]  Re: Abnormal labs noted during PAT appointment   Provider(s) Notified: Schnier, Latina Craver, MD, Sheppard Plumber, NP-C, and Hulen Luster, [place] Notification mode: Routed and/or faxed via [facility]   ABNORMAL LAB VALUE(S): Lab Results  Component Value Date   WBC 14.7 (H) 09/29/2020   HGB 7.8 (L) 09/29/2020   HCT 25.4 (L) 09/29/2020   PLT 514 (H) 09/29/2020    Lab Results  Component Value Date   K 2.9 (L) 09/29/2020   Notes:  Patient is scheduled for a LEFT BELOW KNEE AMPUTATION on 09/30/2020 (tomorrow). The above labs were noted to be abnormal when checked as part of his pre-operative work up. Type and screen obtained and on file. Will forward results to attending surgeon, as well as the inpatient and outpatient APPs, for review and optimization of the patient's K+ level. Will place order to have K+ rechecked prior to his procedure. This is a Personal assistant; no formal response is required.  Quentin Mulling, MSN, APRN, FNP-C, CEN [facility]  Peri-operative Services Nurse Practitioner Phone: [phone] Fax: [phone] 09/29/20 1:17 PM

## 2020-09-29 NOTE — H&P (View-Only) (Signed)
Can we call [facility] and see if we fax an RX for potassium if they can be sure to give the medication as his surgery is tomorrow and it may be cancelled due to a low potassium

## 2020-09-29 NOTE — Progress Notes (Signed)
I spoke with Tresa Endo from [facility] and made her aware with medical advise.  The prescription for Potassium Chloride er po 1 tablet dose was giving to William Melton and also I refax the pre procedure instructions.

## 2020-09-29 NOTE — [employer] Notes (Signed)
Can we call [facility] and see if we fax an RX for potassium if they can be sure to give the medication as his surgery is tomorrow and it may be cancelled due to a low potassium

## 2020-09-30 ENCOUNTER — Inpatient Hospital Stay: Payer: Medicare HMO | Admitting: Urgent Care

## 2020-09-30 ENCOUNTER — Inpatient Hospital Stay
Admission: RE | Admit: 2020-09-30 | Discharge: 2020-10-03 | DRG: 240 | Disposition: A | Discharge status: Skilled Nursing Facility | Payer: Medicare HMO | Source: Skilled Nursing Facility | Attending: Vascular Surgery | Admitting: Vascular Surgery

## 2020-09-30 ENCOUNTER — Encounter
Admission: RE | Disposition: A | Discharge status: Skilled Nursing Facility | Payer: Self-pay | Source: Skilled Nursing Facility | Attending: Vascular Surgery

## 2020-09-30 ENCOUNTER — Other Ambulatory Visit: Payer: Self-pay

## 2020-09-30 ENCOUNTER — Encounter: Payer: Self-pay | Admitting: Vascular Surgery

## 2020-09-30 DIAGNOSIS — Z7982 Long term (current) use of aspirin: Secondary | ICD-10-CM

## 2020-09-30 DIAGNOSIS — Z825 Family history of asthma and other chronic lower respiratory diseases: Secondary | ICD-10-CM | POA: Diagnosis not present

## 2020-09-30 DIAGNOSIS — I70269 Atherosclerosis of native arteries of extremities with gangrene, unspecified extremity: Secondary | ICD-10-CM | POA: Diagnosis not present

## 2020-09-30 DIAGNOSIS — I96 Gangrene, not elsewhere classified: Secondary | ICD-10-CM | POA: Diagnosis not present

## 2020-09-30 DIAGNOSIS — Z7902 Long term (current) use of antithrombotics/antiplatelets: Secondary | ICD-10-CM

## 2020-09-30 DIAGNOSIS — Z8249 Family history of ischemic heart disease and other diseases of the circulatory system: Secondary | ICD-10-CM | POA: Diagnosis not present

## 2020-09-30 DIAGNOSIS — E785 Hyperlipidemia, unspecified: Secondary | ICD-10-CM | POA: Diagnosis present

## 2020-09-30 DIAGNOSIS — Z89432 Acquired absence of left foot: Secondary | ICD-10-CM | POA: Diagnosis not present

## 2020-09-30 DIAGNOSIS — E1122 Type 2 diabetes mellitus with diabetic chronic kidney disease: Secondary | ICD-10-CM | POA: Diagnosis present

## 2020-09-30 DIAGNOSIS — I129 Hypertensive chronic kidney disease with stage 1 through stage 4 chronic kidney disease, or unspecified chronic kidney disease: Secondary | ICD-10-CM | POA: Diagnosis present

## 2020-09-30 DIAGNOSIS — Z20822 Contact with and (suspected) exposure to covid-19: Secondary | ICD-10-CM | POA: Diagnosis present

## 2020-09-30 DIAGNOSIS — Z79899 Other long term (current) drug therapy: Secondary | ICD-10-CM

## 2020-09-30 DIAGNOSIS — N183 Chronic kidney disease, stage 3 unspecified: Secondary | ICD-10-CM | POA: Diagnosis present

## 2020-09-30 DIAGNOSIS — Z83438 Family history of other disorder of lipoprotein metabolism and other lipidemia: Secondary | ICD-10-CM | POA: Diagnosis not present

## 2020-09-30 DIAGNOSIS — I70262 Atherosclerosis of native arteries of extremities with gangrene, left leg: Secondary | ICD-10-CM | POA: Diagnosis present

## 2020-09-30 DIAGNOSIS — E1152 Type 2 diabetes mellitus with diabetic peripheral angiopathy with gangrene: Principal | ICD-10-CM | POA: Diagnosis present

## 2020-09-30 DIAGNOSIS — Z794 Long term (current) use of insulin: Secondary | ICD-10-CM | POA: Diagnosis not present

## 2020-09-30 DIAGNOSIS — Z87891 Personal history of nicotine dependence: Secondary | ICD-10-CM | POA: Diagnosis not present

## 2020-09-30 HISTORY — PX: AMPUTATION: SHX166

## 2020-09-30 LAB — PREPARE RBC (CROSSMATCH)

## 2020-09-30 LAB — CREATININE, SERUM
Creatinine, Ser: 1.66 mg/dL — ABNORMAL HIGH (ref 0.61–1.24)
GFR, Estimated: 45 mL/min — ABNORMAL LOW (ref >60)

## 2020-09-30 LAB — CBC
HCT: 19.9 % — ABNORMAL LOW (ref 39.0–52.0)
Hemoglobin: 6.1 g/dL — ABNORMAL LOW (ref 13.0–17.0)
MCH: 25.2 pg — ABNORMAL LOW (ref 26.0–34.0)
MCHC: 30.7 g/dL (ref 30.0–36.0)
MCV: 82.2 fL (ref 80.0–100.0)
Platelets: 390 10*3/uL (ref 150–400)
RBC: 2.42 MIL/uL — ABNORMAL LOW (ref 4.22–5.81)
RDW: 15.9 % — ABNORMAL HIGH (ref 11.5–15.5)
WBC: 13.9 10*3/uL — ABNORMAL HIGH (ref 4.0–10.5)
nRBC: 0 % (ref 0.0–0.2)

## 2020-09-30 LAB — POCT I-STAT, CHEM 8
BUN: 21 mg/dL (ref 8–23)
Calcium, Ion: 1.2 mmol/L (ref 1.15–1.40)
Chloride: 109 mmol/L (ref 98–111)
Creatinine, Ser: 1.6 mg/dL — ABNORMAL HIGH (ref 0.61–1.24)
Glucose, Bld: 67 mg/dL — ABNORMAL LOW (ref 70–99)
HCT: 21 % — ABNORMAL LOW (ref 39.0–52.0)
Hemoglobin: 7.1 g/dL — ABNORMAL LOW (ref 13.0–17.0)
Potassium: 3.1 mmol/L — ABNORMAL LOW (ref 3.5–5.1)
Sodium: 141 mmol/L (ref 135–145)
TCO2: 20 mmol/L — ABNORMAL LOW (ref 22–32)

## 2020-09-30 LAB — ABO/RH: ABO/RH(D): AB POS

## 2020-09-30 LAB — GLUCOSE, CAPILLARY
Glucose-Capillary: 83 mg/dL (ref 70–99)
Glucose-Capillary: 235 mg/dL — ABNORMAL HIGH (ref 70–99)
Glucose-Capillary: 319 mg/dL — ABNORMAL HIGH (ref 70–99)

## 2020-09-30 LAB — SARS CORONAVIRUS 2 (TAT 6-24 HRS): SARS Coronavirus 2: NEGATIVE

## 2020-09-30 SURGERY — AMPUTATION BELOW KNEE
Anesthesia: General | Site: Knee | Laterality: Left

## 2020-09-30 MED ORDER — SORBITOL 70 % SOLN
30.0000 mL | Freq: Every day | Status: DC | PRN
  Filled 2020-09-30: qty 30

## 2020-09-30 MED ORDER — DIPHENHYDRAMINE HCL 50 MG/ML IJ SOLN: 25.0000 mg | Freq: Once | INTRAMUSCULAR | Status: AC

## 2020-09-30 MED ORDER — MORPHINE SULFATE (PF) 4 MG/ML IV SOLN: 2.0000 mg | INTRAVENOUS | Status: DC | PRN

## 2020-09-30 MED ORDER — BUPIVACAINE LIPOSOME 1.3 % IJ SUSP
INTRAMUSCULAR | Status: AC
  Filled 2020-09-30: qty 20

## 2020-09-30 MED ORDER — CEFAZOLIN SODIUM-DEXTROSE 2-4 GM/100ML-% IV SOLN: 2.0000 g | Freq: Three times a day (TID) | INTRAVENOUS | Status: AC

## 2020-09-30 MED ORDER — AMLODIPINE BESYLATE 10 MG PO TABS
10.0000 mg | ORAL_TABLET | Freq: Every day | ORAL | Status: DC
Start: 2020-09-30 — End: 2020-10-03
  Administered 2020-10-01 – 2020-10-03 (×3): 10 mg via ORAL
  Filled 2020-09-30 (×4): qty 1

## 2020-09-30 MED ORDER — PRAVASTATIN SODIUM 20 MG PO TABS
20.0000 mg | ORAL_TABLET | Freq: Every day | ORAL | Status: DC
  Administered 2020-09-30 – 2020-10-02 (×3): 20 mg via ORAL
  Filled 2020-09-30 (×5): qty 1

## 2020-09-30 MED ORDER — INSULIN ASPART 100 UNIT/ML ~~LOC~~ SOLN: 0.0000 [IU] | Freq: Three times a day (TID) | SUBCUTANEOUS | Status: DC

## 2020-09-30 MED ORDER — MAGNESIUM SULFATE 2 GM/50ML IV SOLN
2.0000 g | Freq: Every day | INTRAVENOUS | Status: DC | PRN
  Filled 2020-09-30: qty 50

## 2020-09-30 MED ORDER — CHLORHEXIDINE GLUCONATE CLOTH 2 % EX PADS: 6.0000 | MEDICATED_PAD | Freq: Once | CUTANEOUS | Status: DC

## 2020-09-30 MED ORDER — ONDANSETRON HCL 4 MG/2ML IJ SOLN: 4.0000 mg | Freq: Once | INTRAMUSCULAR | Status: DC | PRN

## 2020-09-30 MED ORDER — ONDANSETRON HCL 4 MG/2ML IJ SOLN
INTRAMUSCULAR | Status: DC | PRN
  Administered 2020-09-30: 4 mg via INTRAVENOUS

## 2020-09-30 MED ORDER — CEFAZOLIN SODIUM-DEXTROSE 2-4 GM/100ML-% IV SOLN
2.0000 g | INTRAVENOUS | Status: AC
  Administered 2020-09-30: 2 g via INTRAVENOUS

## 2020-09-30 MED ORDER — SODIUM CHLORIDE 0.9 % IV SOLN: INTRAVENOUS | Status: AC

## 2020-09-30 MED ORDER — PHENYLEPHRINE HCL (PRESSORS) 10 MG/ML IV SOLN
INTRAVENOUS | Status: DC | PRN
  Administered 2020-09-30 (×6): 100 ug via INTRAVENOUS

## 2020-09-30 MED ORDER — SODIUM CHLORIDE 0.9 % IV SOLN: INTRAVENOUS | Status: DC

## 2020-09-30 MED ORDER — FUROSEMIDE 10 MG/ML IJ SOLN: 20.0000 mg | Freq: Once | INTRAMUSCULAR | Status: AC

## 2020-09-30 MED ORDER — OXYCODONE HCL 5 MG PO TABS
ORAL_TABLET | ORAL | Status: AC
  Filled 2020-09-30: qty 2

## 2020-09-30 MED ORDER — ONDANSETRON HCL 4 MG/2ML IJ SOLN: 4.0000 mg | Freq: Four times a day (QID) | INTRAMUSCULAR | Status: DC | PRN

## 2020-09-30 MED ORDER — FAMOTIDINE IN NACL 20-0.9 MG/50ML-% IV SOLN
20.0000 mg | Freq: Two times a day (BID) | INTRAVENOUS | Status: DC
  Administered 2020-09-30 – 2020-10-03 (×6): 20 mg via INTRAVENOUS
  Filled 2020-09-30 (×7): qty 50

## 2020-09-30 MED ORDER — ALUM & MAG HYDROXIDE-SIMETH 200-200-20 MG/5ML PO SUSP: 15.0000 mL | ORAL | Status: DC | PRN

## 2020-09-30 MED ORDER — INSULIN ASPART 100 UNIT/ML ~~LOC~~ SOLN
SUBCUTANEOUS | Status: AC
  Administered 2020-09-30: 4 [IU] via SUBCUTANEOUS
  Filled 2020-09-30: qty 1

## 2020-09-30 MED ORDER — FENTANYL CITRATE (PF) 100 MCG/2ML IJ SOLN
INTRAMUSCULAR | Status: DC | PRN
  Administered 2020-09-30: 25 ug via INTRAVENOUS
  Administered 2020-09-30: 50 ug via INTRAVENOUS
  Administered 2020-09-30: 25 ug via INTRAVENOUS

## 2020-09-30 MED ORDER — SUCCINYLCHOLINE CHLORIDE 200 MG/10ML IV SOSY
PREFILLED_SYRINGE | INTRAVENOUS | Status: AC
  Filled 2020-09-30: qty 10

## 2020-09-30 MED ORDER — LIDOCAINE HCL (CARDIAC) PF 100 MG/5ML IV SOSY
PREFILLED_SYRINGE | INTRAVENOUS | Status: DC | PRN
  Administered 2020-09-30: 100 mg via INTRAVENOUS

## 2020-09-30 MED ORDER — HYDROMORPHONE HCL 1 MG/ML IJ SOLN: 1.0000 mg | Freq: Once | INTRAMUSCULAR | Status: DC | PRN

## 2020-09-30 MED ORDER — ONDANSETRON HCL 4 MG/2ML IJ SOLN
INTRAMUSCULAR | Status: AC
  Filled 2020-09-30: qty 2

## 2020-09-30 MED ORDER — CEFAZOLIN SODIUM-DEXTROSE 2-4 GM/100ML-% IV SOLN
INTRAVENOUS | Status: AC
  Administered 2020-10-01: 2 g via INTRAVENOUS
  Filled 2020-09-30: qty 100

## 2020-09-30 MED ORDER — PROPOFOL 10 MG/ML IV BOLUS
INTRAVENOUS | Status: DC | PRN
  Administered 2020-09-30: 150 mg via INTRAVENOUS

## 2020-09-30 MED ORDER — DIPHENHYDRAMINE HCL 50 MG/ML IJ SOLN
INTRAMUSCULAR | Status: AC
  Administered 2020-09-30: 25 mg via INTRAVENOUS
  Filled 2020-09-30: qty 1

## 2020-09-30 MED ORDER — ACETAMINOPHEN 325 MG PO TABS: 650.0000 mg | ORAL_TABLET | Freq: Once | ORAL | Status: AC

## 2020-09-30 MED ORDER — HEPARIN SODIUM (PORCINE) 5000 UNIT/ML IJ SOLN
5000.0000 [IU] | Freq: Three times a day (TID) | INTRAMUSCULAR | Status: DC
  Administered 2020-10-01: 5000 [IU] via SUBCUTANEOUS

## 2020-09-30 MED ORDER — SUGAMMADEX SODIUM 200 MG/2ML IV SOLN
INTRAVENOUS | Status: DC | PRN
  Administered 2020-09-30: 160 mg via INTRAVENOUS

## 2020-09-30 MED ORDER — FENTANYL CITRATE (PF) 100 MCG/2ML IJ SOLN: 25.0000 ug | INTRAMUSCULAR | Status: DC | PRN

## 2020-09-30 MED ORDER — GUAIFENESIN-DM 100-10 MG/5ML PO SYRP
15.0000 mL | ORAL_SOLUTION | ORAL | Status: DC | PRN
  Filled 2020-09-30: qty 15

## 2020-09-30 MED ORDER — FENTANYL CITRATE (PF) 100 MCG/2ML IJ SOLN
INTRAMUSCULAR | Status: AC
  Filled 2020-09-30: qty 2

## 2020-09-30 MED ORDER — OXYCODONE-ACETAMINOPHEN 5-325 MG PO TABS
ORAL_TABLET | ORAL | Status: AC
  Filled 2020-09-30: qty 2

## 2020-09-30 MED ORDER — SUCCINYLCHOLINE CHLORIDE 20 MG/ML IJ SOLN
INTRAMUSCULAR | Status: DC | PRN
  Administered 2020-09-30: 100 mg via INTRAVENOUS

## 2020-09-30 MED ORDER — SODIUM CHLORIDE (PF) 0.9 % IJ SOLN
INTRAMUSCULAR | Status: AC
  Filled 2020-09-30: qty 50

## 2020-09-30 MED ORDER — ACETAMINOPHEN 325 MG PO TABS
ORAL_TABLET | ORAL | Status: AC
  Administered 2020-09-30: 650 mg via ORAL
  Filled 2020-09-30: qty 2

## 2020-09-30 MED ORDER — DOCUSATE SODIUM 100 MG PO CAPS
100.0000 mg | ORAL_CAPSULE | Freq: Every day | ORAL | Status: DC
  Administered 2020-10-01 – 2020-10-03 (×3): 100 mg via ORAL
  Filled 2020-09-30 (×3): qty 1

## 2020-09-30 MED ORDER — NICOTINE 21 MG/24HR TD PT24
21.0000 mg | MEDICATED_PATCH | Freq: Every day | TRANSDERMAL | Status: DC
  Administered 2020-10-01 – 2020-10-03 (×3): 21 mg via TRANSDERMAL
  Filled 2020-09-30 (×4): qty 1

## 2020-09-30 MED ORDER — ROCURONIUM BROMIDE 100 MG/10ML IV SOLN
INTRAVENOUS | Status: DC | PRN
  Administered 2020-09-30: 45 mg via INTRAVENOUS
  Administered 2020-09-30: 5 mg via INTRAVENOUS

## 2020-09-30 MED ORDER — HEPARIN SODIUM (PORCINE) 5000 UNIT/ML IJ SOLN
INTRAMUSCULAR | Status: AC
  Administered 2020-09-30: 5000 [IU] via SUBCUTANEOUS
  Filled 2020-09-30: qty 1

## 2020-09-30 MED ORDER — POLYETHYLENE GLYCOL 3350 17 G PO PACK
17.0000 g | PACK | Freq: Every day | ORAL | Status: DC | PRN
  Filled 2020-09-30: qty 1

## 2020-09-30 MED ORDER — LIDOCAINE HCL (PF) 2 % IJ SOLN
INTRAMUSCULAR | Status: AC
  Filled 2020-09-30: qty 5

## 2020-09-30 MED ORDER — INSULIN GLARGINE 100 UNIT/ML ~~LOC~~ SOLN
20.0000 [IU] | Freq: Every day | SUBCUTANEOUS | Status: AC
  Administered 2020-10-01 – 2020-10-03 (×3): 20 [IU] via SUBCUTANEOUS
  Filled 2020-09-30 (×3): qty 0.2

## 2020-09-30 MED ORDER — DEXAMETHASONE SODIUM PHOSPHATE 10 MG/ML IJ SOLN
INTRAMUSCULAR | Status: AC
  Filled 2020-09-30: qty 1

## 2020-09-30 MED ORDER — PHENOL 1.4 % MT LIQD
1.0000 | OROMUCOSAL | Status: DC | PRN
  Filled 2020-09-30: qty 177

## 2020-09-30 MED ORDER — ACETAMINOPHEN 650 MG RE SUPP: 325.0000 mg | RECTAL | Status: DC | PRN

## 2020-09-30 MED ORDER — ROCURONIUM BROMIDE 10 MG/ML (PF) SYRINGE
PREFILLED_SYRINGE | INTRAVENOUS | Status: AC
  Filled 2020-09-30: qty 10

## 2020-09-30 MED ORDER — SODIUM CHLORIDE 0.9 % IV SOLN
INTRAVENOUS | Status: DC | PRN
  Administered 2020-09-30: 55 mL

## 2020-09-30 MED ORDER — CEFAZOLIN SODIUM-DEXTROSE 2-4 GM/100ML-% IV SOLN
INTRAVENOUS | Status: AC
  Administered 2020-09-30: 2 g via INTRAVENOUS
  Filled 2020-09-30: qty 100

## 2020-09-30 MED ORDER — POTASSIUM CHLORIDE CRYS ER 20 MEQ PO TBCR: 20.0000 meq | EXTENDED_RELEASE_TABLET | Freq: Every day | ORAL | Status: DC | PRN

## 2020-09-30 MED ORDER — SODIUM CHLORIDE 0.9% IV SOLUTION: Freq: Once | INTRAVENOUS | Status: AC

## 2020-09-30 MED ORDER — FAMOTIDINE 20 MG PO TABS
ORAL_TABLET | ORAL | Status: AC
  Administered 2020-09-30: 20 mg via ORAL
  Filled 2020-09-30: qty 1

## 2020-09-30 MED ORDER — CHLORHEXIDINE GLUCONATE 0.12 % MT SOLN: 15.0000 mL | Freq: Once | OROMUCOSAL | Status: AC

## 2020-09-30 MED ORDER — DEXAMETHASONE SODIUM PHOSPHATE 10 MG/ML IJ SOLN
INTRAMUSCULAR | Status: DC | PRN
  Administered 2020-09-30: 10 mg via INTRAVENOUS

## 2020-09-30 MED ORDER — FAMOTIDINE 20 MG PO TABS: 20.0000 mg | ORAL_TABLET | Freq: Once | ORAL | Status: AC

## 2020-09-30 MED ORDER — INSULIN GLARGINE 100 UNIT/ML ~~LOC~~ SOLN: 20.0000 [IU] | Freq: Every day | SUBCUTANEOUS | Status: DC

## 2020-09-30 MED ORDER — METOPROLOL SUCCINATE ER 25 MG PO TB24
25.0000 mg | ORAL_TABLET | Freq: Every day | ORAL | Status: DC
  Administered 2020-10-01 – 2020-10-03 (×3): 25 mg via ORAL
  Filled 2020-09-30 (×4): qty 1

## 2020-09-30 MED ORDER — INSULIN ASPART 100 UNIT/ML ~~LOC~~ SOLN: 0.0000 [IU] | Freq: Every day | SUBCUTANEOUS | Status: DC

## 2020-09-30 MED ORDER — ORAL CARE MOUTH RINSE: 15.0000 mL | Freq: Once | OROMUCOSAL | Status: AC

## 2020-09-30 MED ORDER — HEPARIN SODIUM (PORCINE) 5000 UNIT/ML IJ SOLN
INTRAMUSCULAR | Status: AC
  Filled 2020-09-30: qty 1

## 2020-09-30 MED ORDER — OXYCODONE-ACETAMINOPHEN 5-325 MG PO TABS
1.0000 | ORAL_TABLET | ORAL | Status: DC | PRN
  Administered 2020-09-30 (×2): 2 via ORAL
  Administered 2020-10-01: 1 via ORAL
  Administered 2020-10-01 – 2020-10-02 (×2): 2 via ORAL

## 2020-09-30 MED ORDER — BUPIVACAINE HCL (PF) 0.5 % IJ SOLN
INTRAMUSCULAR | Status: AC
  Filled 2020-09-30: qty 30

## 2020-09-30 MED ORDER — FUROSEMIDE 10 MG/ML IJ SOLN
INTRAMUSCULAR | Status: AC
  Administered 2020-09-30: 20 mg via INTRAVENOUS
  Filled 2020-09-30: qty 2

## 2020-09-30 MED ORDER — ACETAMINOPHEN 325 MG PO TABS
325.0000 mg | ORAL_TABLET | ORAL | Status: DC | PRN
  Administered 2020-10-01: 650 mg via ORAL

## 2020-09-30 MED ORDER — PROPOFOL 10 MG/ML IV BOLUS
INTRAVENOUS | Status: AC
  Filled 2020-09-30: qty 20

## 2020-09-30 MED ORDER — ASPIRIN EC 81 MG PO TBEC
81.0000 mg | DELAYED_RELEASE_TABLET | Freq: Every day | ORAL | Status: DC
  Administered 2020-10-01 – 2020-10-03 (×2): 81 mg via ORAL
  Filled 2020-09-30 (×4): qty 1

## 2020-09-30 MED ORDER — CHLORHEXIDINE GLUCONATE 0.12 % MT SOLN
OROMUCOSAL | Status: AC
  Administered 2020-09-30: 15 mL via OROMUCOSAL
  Filled 2020-09-30: qty 15

## 2020-09-30 SURGICAL SUPPLY — 52 items
BAG COUNTER SPONGE EZ (MISCELLANEOUS) IMPLANT
BLADE SAGITTAL WIDE XTHICK NO (BLADE) ×2 IMPLANT
BLADE SURG SZ10 CARB STEEL (BLADE) ×2 IMPLANT
BNDG COHESIVE 4X5 TAN STRL (GAUZE/BANDAGES/DRESSINGS) ×2 IMPLANT
BNDG ELASTIC 4X5.8 VLCR NS LF (GAUZE/BANDAGES/DRESSINGS) ×2 IMPLANT
BNDG GAUZE 4.5X4.1 6PLY STRL (MISCELLANEOUS) ×2 IMPLANT
BRUSH SCRUB EZ  4% CHG (MISCELLANEOUS) ×1
BRUSH SCRUB EZ 4% CHG (MISCELLANEOUS) ×1 IMPLANT
CANISTER SUCT 1200ML W/VALVE (MISCELLANEOUS) IMPLANT
CANISTER WOUND CARE 500ML ATS (WOUND CARE) IMPLANT
CHLORAPREP W/TINT 26 (MISCELLANEOUS) ×2 IMPLANT
COVER WAND RF STERILE (DRAPES) ×2 IMPLANT
DERMABOND ADVANCED (GAUZE/BANDAGES/DRESSINGS) ×2
DERMABOND ADVANCED .7 DNX12 (GAUZE/BANDAGES/DRESSINGS) ×2 IMPLANT
DRAPE INCISE IOBAN 66X45 STRL (DRAPES) ×2 IMPLANT
DRSG GAUZE FLUFF 36X18 (GAUZE/BANDAGES/DRESSINGS) ×2 IMPLANT
DRSG VAC ATS MED SENSATRAC (GAUZE/BANDAGES/DRESSINGS) IMPLANT
ELECT CAUTERY BLADE 6.4 (BLADE) ×2 IMPLANT
ELECT REM PT RETURN 9FT ADLT (ELECTROSURGICAL) ×2
ELECTRODE REM PT RTRN 9FT ADLT (ELECTROSURGICAL) ×1 IMPLANT
GLOVE INDICATOR 7.5 STRL GRN (GLOVE) ×2 IMPLANT
GLOVE SURG ENC MOIS LTX SZ7 (GLOVE) ×2 IMPLANT
GLOVE SURG SYN 8.0 (GLOVE) ×2 IMPLANT
GOWN STRL REUS W/ TWL LRG LVL3 (GOWN DISPOSABLE) ×2 IMPLANT
GOWN STRL REUS W/ TWL XL LVL3 (GOWN DISPOSABLE) ×1 IMPLANT
GOWN STRL REUS W/TWL LRG LVL3 (GOWN DISPOSABLE) ×2
GOWN STRL REUS W/TWL XL LVL3 (GOWN DISPOSABLE) ×1
HANDLE YANKAUER SUCT BULB TIP (MISCELLANEOUS) ×2 IMPLANT
KIT TURNOVER KIT A (KITS) ×2 IMPLANT
LABEL OR SOLS (LABEL) ×2 IMPLANT
MANIFOLD NEPTUNE II (INSTRUMENTS) ×2 IMPLANT
NS IRRIG 500ML POUR BTL (IV SOLUTION) ×2 IMPLANT
PACK EXTREMITY ARMC (MISCELLANEOUS) ×2 IMPLANT
PAD ABD DERMACEA PRESS 5X9 (GAUZE/BANDAGES/DRESSINGS) ×2 IMPLANT
PAD PREP 24X41 OB/GYN DISP (PERSONAL CARE ITEMS) ×2 IMPLANT
SPONGE LAP 18X18 RF (DISPOSABLE) ×6 IMPLANT
STAPLER SKIN PROX 35W (STAPLE) IMPLANT
STOCKINETTE M/LG 89821 (MISCELLANEOUS) ×2 IMPLANT
SUT MNCRL 4-0 (SUTURE) ×2
SUT MNCRL 4-0 27XMFL (SUTURE) ×2
SUT SILK 2 0 (SUTURE) ×1
SUT SILK 2-0 18XBRD TIE 12 (SUTURE) ×1 IMPLANT
SUT SILK 3 0 (SUTURE) ×3
SUT SILK 3-0 18XBRD TIE 12 (SUTURE) ×3 IMPLANT
SUT VIC AB 0 CT1 36 (SUTURE) ×8 IMPLANT
SUT VIC AB 3-0 SH 27 (SUTURE) ×2
SUT VIC AB 3-0 SH 27X BRD (SUTURE) ×2 IMPLANT
SUT VICRYL PLUS ABS 0 54 (SUTURE) ×2 IMPLANT
SUTURE MNCRL 4-0 27XMF (SUTURE) ×2 IMPLANT
SYR 20ML LL LF (SYRINGE) ×4 IMPLANT
TAPE UMBIL 1/8X18 RADIOPA (MISCELLANEOUS) ×2 IMPLANT
TOWEL OR 17X26 4PK STRL BLUE (TOWEL DISPOSABLE) ×4 IMPLANT

## 2020-09-30 NOTE — Anesthesia Preprocedure Evaluation (Addendum)
Anesthesia Evaluation  Patient identified by MRN, date of birth, ID band Patient awake    Reviewed: Allergy & Precautions, H&P , NPO status , Patient's Chart, lab work & pertinent test results, reviewed documented beta blocker date and time   Airway Mallampati: II  TM Distance: >3 FB Neck ROM: full    Dental  (+) Teeth Intact   Pulmonary neg pulmonary ROS, Current Smoker and Patient abstained from smoking., former smoker,    Pulmonary exam normal        Cardiovascular Exercise Tolerance: Good hypertension, On Medications + Peripheral Vascular Disease  Normal cardiovascular exam Rate:Normal     Neuro/Psych negative neurological ROS  negative psych ROS   GI/Hepatic negative GI ROS, Neg liver ROS,   Endo/Other  negative endocrine ROSdiabetes  Renal/GU Renal InsufficiencyRenal disease  negative genitourinary   Musculoskeletal  (+) Arthritis , Osteoarthritis,    Abdominal   Peds  Hematology negative hematology ROS (+)   Anesthesia Other Findings Past Medical History: No date: Arthritis No date: CKD (chronic kidney disease), stage III (HCC) No date: Diabetes mellitus without complication (HCC) No date: Hyperlipidemia No date: Hypertension  Reproductive/Obstetrics negative OB ROS                             Anesthesia Physical  Anesthesia Plan  ASA: III  Anesthesia Plan: General   Post-op Pain Management:    Induction: Intravenous  PONV Risk Score and Plan:   Airway Management Planned: Oral ETT  Additional Equipment:   Intra-op Plan:   Post-operative Plan: Extubation in OR  Informed Consent: I have reviewed the patients History and Physical, chart, labs and discussed the procedure including the risks, benefits and alternatives for the proposed anesthesia with the patient or authorized representative who has indicated his/her understanding and acceptance.       Plan  Discussed with: CRNA  Anesthesia Plan Comments:         Anesthesia Quick Evaluation

## 2020-09-30 NOTE — Interval H&P Note (Signed)
History and Physical Interval Note:  09/30/2020 9:59 AM  William Melton  has presented today for surgery, with the diagnosis of GANGRENE.  The various methods of treatment have been discussed with the patient and family. After consideration of risks, benefits and other options for treatment, the patient has consented to  Procedure(s): AMPUTATION BELOW KNEE (Left) as a surgical intervention.  The patient's history has been reviewed, patient examined, no change in status, stable for surgery.  I have reviewed the patient's chart and labs.  Questions were answered to the patient's satisfaction.     [name]

## 2020-09-30 NOTE — Op Note (Addendum)
  [facility]   OPERATIVE NOTE   PROCEDURE:  Left below-the-knee amputation  PRE-OPERATIVE DIAGNOSIS: Left foot gangrene  POST-OPERATIVE DIAGNOSIS: same as above  SURGEON: Renford Dills, MD  ASSISTANT(S): Ms. Raul Del  ANESTHESIA: general  ESTIMATED BLOOD LOSS: 100 cc  FINDING(S): Viable muscle bellies  SPECIMEN(S):  Left below-the-knee amputation  INDICATIONS:   [facility] is a 70 y.o. male who presents with left forefoot gangrene.  The patient is scheduled for a left below-the-knee amputation.  I discussed in depth with the patient the risks, benefits, and alternatives to this procedure.  The patient is aware that the risk of this operation included but are not limited to:  bleeding, infection, myocardial infarction, stroke, death, failure to heal amputation wound, and possible need for more proximal amputation.  The patient is aware of the risks and agrees proceed forward with the procedure.  DESCRIPTION: After full informed written consent was obtained from the patient, the patient was taken to the operating room, and placed supine upon the operating table.  Prior to induction, the patient received IV antibiotics.  The patient was then prepped and draped in the standard fashion for a left below-the-knee amputation.  After obtaining adequate anesthesia, the patient was prepped and draped in the standard fashion for a below-the-knee amputation.  I marked out the anterior and posterior flaps for a posterior flap type of amputation.  I made the incisions for these flaps, and then dissected through the subcutaneous tissue, fascia, and muscles circumferentially.  I elevated  the periosteal tissue from both the tibia as well as the fibula 4-5 cm more proximal than the anterior skin flap.  I then transected the tibia with a power saw at this level I transected the fibula with a bone cutter making sure that the proximal cut was several centimeters above the  level of the tibia.  Then I smoothed out the rough edges of the bone.  At this point, the specimen was passed off the field as the below-the-knee amputation.  At this point, I clamped all visibly bleeding arteries and veins using a combination of suture ligation with silk suture and electrocautery.   Bleeding continued to be controlled with electrocautery and suture ligature.  The stump was washed off with sterile normal saline and no further active bleeding was noted.  Exparel was then infiltrated into the soft tissues and around the nerve sheaths.  I reapproximated the anterior and posterior fascia  with interrupted stitches of 0 Vicryl.  This was completed along the entire length of anterior and posterior fascia until there were no more loose space in the fascial line. The skin was then  reapproximated with 4-0 Monocryl subcuticular.  The stump was washed off and dried.  The incision was dressed with Xeroform and ABD pads, and  then fluffs were applied.  Kerlix was wrapped around the leg and then gently an ACE wrap was applied.  A large Ioban was then placed over the ACE wrap to secure the dressing. The patient was then awakened and take to the recovery room in stable condition.   COMPLICATIONS: none  CONDITION: stable  [name]  09/30/2020, 12:30 PM   This note was created with Dragon Medical transcription system. Any errors in dictation are purely unintentional.

## 2020-09-30 NOTE — Anesthesia Procedure Notes (Signed)
Procedure Name: Intubation Date/Time: 09/30/2020 10:39 AM Performed by: Irving Burton, CRNA Pre-anesthesia Checklist: Emergency Drugs available, Suction available, Patient being monitored and Patient identified Patient Re-evaluated:Patient Re-evaluated prior to induction Oxygen Delivery Method: Circle system utilized Preoxygenation: Pre-oxygenation with 100% oxygen Induction Type: IV induction, Rapid sequence and Cricoid Pressure applied Laryngoscope Size: McGraph and 3 Grade View: Grade I Tube type: Oral Tube size: 7.5 mm Number of attempts: 1 Airway Equipment and Method: Stylet and Video-laryngoscopy Placement Confirmation: ETT inserted through vocal cords under direct vision,  positive ETCO2 and breath sounds checked- equal and bilateral Secured at: 23 cm Tube secured with: Tape Dental Injury: Teeth and Oropharynx as per pre-operative assessment

## 2020-09-30 NOTE — Plan of Care (Signed)

## 2020-09-30 NOTE — Transfer of Care (Signed)
Immediate Anesthesia Transfer of Care Note  Patient: William Melton  Procedure(s) Performed: AMPUTATION BELOW KNEE (Left Knee)  Patient Location: PACU  Anesthesia Type:General  Level of Consciousness: sedated  Airway & Oxygen Therapy: Patient connected to face mask oxygen  Post-op Assessment: Post -op Vital signs reviewed and stable  Post vital signs: stable  Last Vitals:  Vitals Value Taken Time  BP 133/77 09/30/20 1220  Temp    Pulse 83 09/30/20 1220  Resp 13 09/30/20 1220  SpO2 100 % 09/30/20 1220  Vitals shown include unvalidated device data.  Last Pain:  Vitals:   09/30/20 0928  TempSrc: Temporal  PainSc: 0-No pain      Patients Stated Pain Goal: 0 (09/30/20 0045)  Complications: No complications documented.

## 2020-09-30 NOTE — Interval H&P Note (Signed)
History and Physical Interval Note:  09/30/2020 9:57 AM  William Melton  has presented today for surgery, with the diagnosis of GANGRENE.  The various methods of treatment have been discussed with the patient and family. After consideration of risks, benefits and other options for treatment, the patient has consented to  Procedure(s): AMPUTATION BELOW KNEE (Left) as a surgical intervention.  The patient's history has been reviewed, patient examined, no change in status, stable for surgery.  I have reviewed the patient's chart and labs.  Questions were answered to the patient's satisfaction.     [name]

## 2020-10-01 ENCOUNTER — Encounter: Payer: Self-pay | Admitting: Vascular Surgery

## 2020-10-01 DIAGNOSIS — I70269 Atherosclerosis of native arteries of extremities with gangrene, unspecified extremity: Secondary | ICD-10-CM | POA: Diagnosis not present

## 2020-10-01 LAB — GLUCOSE, CAPILLARY
Glucose-Capillary: 132 mg/dL — ABNORMAL HIGH (ref 70–99)
Glucose-Capillary: 129 mg/dL — ABNORMAL HIGH (ref 70–99)
Glucose-Capillary: 110 mg/dL — ABNORMAL HIGH (ref 70–99)
Glucose-Capillary: 168 mg/dL — ABNORMAL HIGH (ref 70–99)

## 2020-10-01 LAB — TYPE AND SCREEN
ABO/RH(D): AB POS
Antibody Screen: NEGATIVE
Unit division: 0
Unit division: 0

## 2020-10-01 LAB — CBC
HCT: 26.8 % — ABNORMAL LOW (ref 39.0–52.0)
HCT: 27.7 % — ABNORMAL LOW (ref 39.0–52.0)
Hemoglobin: 8.7 g/dL — ABNORMAL LOW (ref 13.0–17.0)
Hemoglobin: 9 g/dL — ABNORMAL LOW (ref 13.0–17.0)
MCH: 26.5 pg (ref 26.0–34.0)
MCH: 26.7 pg (ref 26.0–34.0)
MCHC: 32.5 g/dL (ref 30.0–36.0)
MCHC: 32.5 g/dL (ref 30.0–36.0)
MCV: 81.7 fL (ref 80.0–100.0)
MCV: 82.2 fL (ref 80.0–100.0)
Platelets: 365 10*3/uL (ref 150–400)
Platelets: 366 10*3/uL (ref 150–400)
RBC: 3.28 MIL/uL — ABNORMAL LOW (ref 4.22–5.81)
RBC: 3.37 MIL/uL — ABNORMAL LOW (ref 4.22–5.81)
RDW: 15.9 % — ABNORMAL HIGH (ref 11.5–15.5)
RDW: 16.1 % — ABNORMAL HIGH (ref 11.5–15.5)
WBC: 17.8 10*3/uL — ABNORMAL HIGH (ref 4.0–10.5)
WBC: 19.5 10*3/uL — ABNORMAL HIGH (ref 4.0–10.5)
nRBC: 0 % (ref 0.0–0.2)
nRBC: 0 % (ref 0.0–0.2)

## 2020-10-01 LAB — BASIC METABOLIC PANEL
Anion gap: 8 (ref 5–15)
BUN: 21 mg/dL (ref 8–23)
CO2: 21 mmol/L — ABNORMAL LOW (ref 22–32)
Calcium: 8.4 mg/dL — ABNORMAL LOW (ref 8.9–10.3)
Chloride: 108 mmol/L (ref 98–111)
Creatinine, Ser: 1.5 mg/dL — ABNORMAL HIGH (ref 0.61–1.24)
GFR, Estimated: 50 mL/min — ABNORMAL LOW (ref >60)
Glucose, Bld: 187 mg/dL — ABNORMAL HIGH (ref 70–99)
Potassium: 3.7 mmol/L (ref 3.5–5.1)
Sodium: 137 mmol/L (ref 135–145)

## 2020-10-01 LAB — BPAM RBC
Blood Product Expiration Date: 202202212359
Blood Product Expiration Date: 202202212359
ISSUE DATE / TIME: 202201251533
ISSUE DATE / TIME: 202201252051
Unit Type and Rh: 6200
Unit Type and Rh: 6200

## 2020-10-01 MED ORDER — OXYCODONE-ACETAMINOPHEN 5-325 MG PO TABS
ORAL_TABLET | ORAL | Status: AC
  Administered 2020-10-01: 1 via ORAL
  Filled 2020-10-01: qty 1

## 2020-10-01 MED ORDER — CEFAZOLIN SODIUM-DEXTROSE 2-4 GM/100ML-% IV SOLN
INTRAVENOUS | Status: AC
  Filled 2020-10-01: qty 100

## 2020-10-01 MED ORDER — OXYCODONE-ACETAMINOPHEN 5-325 MG PO TABS
ORAL_TABLET | ORAL | Status: AC
  Filled 2020-10-01: qty 2

## 2020-10-01 MED ORDER — MORPHINE SULFATE (PF) 4 MG/ML IV SOLN
INTRAVENOUS | Status: AC
  Administered 2020-10-01: 4 mg via INTRAVENOUS
  Filled 2020-10-01: qty 1

## 2020-10-01 MED ORDER — OXYCODONE-ACETAMINOPHEN 5-325 MG PO TABS
ORAL_TABLET | ORAL | Status: AC
  Filled 2020-10-01: qty 1

## 2020-10-01 MED ORDER — HEPARIN SODIUM (PORCINE) 5000 UNIT/ML IJ SOLN
INTRAMUSCULAR | Status: AC
  Administered 2020-10-01: 5000 [IU] via SUBCUTANEOUS
  Filled 2020-10-01: qty 1

## 2020-10-01 MED ORDER — ACETAMINOPHEN 325 MG PO TABS
ORAL_TABLET | ORAL | Status: AC
  Filled 2020-10-01: qty 2

## 2020-10-01 MED ORDER — ASPIRIN 81 MG PO CHEW
CHEWABLE_TABLET | ORAL | Status: AC
  Filled 2020-10-01: qty 1

## 2020-10-01 MED ORDER — HEPARIN SODIUM (PORCINE) 5000 UNIT/ML IJ SOLN
INTRAMUSCULAR | Status: AC
  Filled 2020-10-01: qty 1

## 2020-10-01 NOTE — Evaluation (Signed)
Physical Therapy Evaluation Patient Details Name: William Melton MRN: [id number] DOB: [birth year] Today's Date: 10/01/2020   History of Present Illness  Pt is a 70 y.o. 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  PMH includes CKD, DM, HLD, htn, L LE angio, and h/o L LE amputations.  Clinical Impression  Prior to hospital admission, pt reports he has been at SNF for rehab past 3 weeks (walking short distances with walker with assist and performing w/c level transfers with assist); pt plans to discharge back to SNF upon hospital discharge for continued rehab.  Currently pt is mod assist with bed mobility and min to mod assist x2 to stand up to walker (limited time standing d/t pt fatigue).  Pt appearing tired during session (pt closing eyes and appearing to fall asleep at times) but woken with vc's (pt received pain medication prior to PT session).  L LE movement limited during session d/t L LE residual limb post-surgical pain.  Pt would benefit from skilled PT to address noted impairments and functional limitations (see below for any additional details).  Upon hospital discharge, pt would benefit from STR.    Follow Up Recommendations SNF    Equipment Recommendations  Rolling walker with 5" wheels;3in1 (PT);Wheelchair (measurements PT);Wheelchair cushion (measurements PT)    Recommendations for Other Services OT consult     Precautions / Restrictions Precautions Precautions: Fall Precaution Comments: Elevate L LE residual limb on pillow Restrictions Weight Bearing Restrictions: Yes LLE Weight Bearing: Non weight bearing      Mobility  Bed Mobility Overal bed mobility: Needs Assistance Bed Mobility: Supine to Sit;Sit to Supine     Supine to sit: Mod assist;HOB elevated Sit to supine: Mod assist;HOB elevated   General bed mobility comments: assist for trunk semi-supine to sitting edge of bed; assist  for L>R LE sit to semi-supine in bed; vc's for technique; 2 assist to boost pt up in bed end of session (using bed pad)    Transfers Overall transfer level: Needs assistance Equipment used: Rolling walker (2 wheeled) Transfers: Sit to/from Stand Sit to Stand: Min assist;Mod assist;+2 physical assistance         General transfer comment: vc's for R LE and B UE placement; assist to initiate and come to full stand; assist to control descent sitting  Ambulation/Gait             General Gait Details: Deferred (pt declined to trial today)  Stairs            Wheelchair Mobility    Modified Rankin (Stroke Patients Only)       Balance Overall balance assessment: Needs assistance Sitting-balance support: No upper extremity supported;Feet supported Sitting balance-Leahy Scale: Good Sitting balance - Comments: steady sitting reaching within BOS   Standing balance support: Bilateral upper extremity supported Standing balance-Leahy Scale: Poor Standing balance comment: pt requiring B UE support on RW for static standing balance                             Pertinent Vitals/Pain Pain Assessment: 0-10 Pain Score: 3  Pain Location: L LE residual limb Pain Descriptors / Indicators: Sore;Tender;Operative site guarding;Discomfort Pain Intervention(s): Limited activity within patient's tolerance;Monitored during session;Premedicated before session;Repositioned     Home Living Family/patient expects to be discharged to:: Skilled nursing facility (pt reports he has been at [facility] for past 3 weeks for  rehab) Living Arrangements: Alone   Type of Home: House Home Access: Ramped entrance     Home Layout: One level Home Equipment: Wheelchair - [employer] - 2 wheels;Cane - single point Additional Comments: Pt reports DME is from his wife.    Prior Function Level of Independence: Needs assistance   Gait / Transfers Assistance Needed: Pt reports most  recently walking short distances with RW with assist; also performing w/c transfers with assist (at STR)           Hand Dominance        Extremity/Trunk Assessment   Upper Extremity Assessment Upper Extremity Assessment: Defer to OT evaluation    Lower Extremity Assessment Lower Extremity Assessment: RLE deficits/detail;LLE deficits/detail RLE Deficits / Details: at least 3/5 AROM hip flexion, knee flexion/extension, and DF/PF LLE Deficits / Details: fair L quad set strength; at least 2+/5 L hip flexion; L knee extension grossly 10 degrees short of neutral; L knee flexion at least 30 degrees (ROM limited d/t L knee pain) LLE: Unable to fully assess due to pain    Cervical / Trunk Assessment Cervical / Trunk Assessment: Normal  Communication   Communication: No difficulties  Cognition Arousal/Alertness: Suspect due to medications (pt intermittently closing eyes and appearing to fall asleep during session (pt with recent pain medication)) Behavior During Therapy: Flat affect Overall Cognitive Status: Within Functional Limits for tasks assessed                                        General Comments General comments (skin integrity, edema, etc.): L LE dressings in place.  Nursing cleared pt for participation in physical therapy.  Pt agreeable to PT session.    Exercises Total Joint Exercises Quad Sets: AROM;Strengthening;Left;10 reps;Supine Hip ABduction/ADduction: AAROM;Strengthening;Left;10 reps;Supine   Assessment/Plan    PT Assessment Patient needs continued PT services  PT Problem List Decreased strength;Decreased range of motion;Decreased activity tolerance;Decreased balance;Decreased mobility;Decreased knowledge of use of DME;Decreased knowledge of precautions;Pain;Decreased skin integrity       PT Treatment Interventions DME instruction;Gait training;Functional mobility training;Therapeutic activities;Therapeutic exercise;Balance  training;Patient/family education    PT Goals (Current goals can be found in the Care Plan section)  Acute Rehab PT Goals Patient Stated Goal: to improve mobility PT Goal Formulation: With patient Time For Goal Achievement: 10/15/20 Potential to Achieve Goals: Good    Frequency 7X/week   Barriers to discharge Decreased caregiver support      Co-evaluation               AM-PAC PT "6 Clicks" Mobility  Outcome Measure Help needed turning from your back to your side while in a flat bed without using bedrails?: None Help needed moving from lying on your back to sitting on the side of a flat bed without using bedrails?: A Lot Help needed moving to and from a bed to a chair (including a wheelchair)?: Total Help needed standing up from a chair using your arms (e.g., wheelchair or bedside chair)?: Total Help needed to walk in hospital room?: Total Help needed climbing 3-5 steps with a railing? : Total 6 Click Score: 10    End of Session Equipment Utilized During Treatment: Gait belt Activity Tolerance: Patient limited by fatigue Patient left: in bed;with call bell/phone within reach;with bed alarm set;Other (comment) (L LE residual limb elevated on pillow; OT present end of session) Nurse Communication: Mobility status;Precautions PT Visit Diagnosis:  Other abnormalities of gait and mobility (R26.89);Muscle weakness (generalized) (M62.81);Difficulty in walking, not elsewhere classified (R26.2);Pain Pain - Right/Left: Left Pain - part of body: Leg    Time: 3382-5053 PT Time Calculation (min) (ACUTE ONLY): 18 min   Charges:   PT Evaluation $PT Eval Low Complexity: 1 Low PT Treatments $Therapeutic Exercise: 8-22 mins       Montay Vanvoorhis, PT 10/01/20, 1:25 PM

## 2020-10-01 NOTE — Progress Notes (Signed)
[facility] Daily Progress Note   Subjective: 10/01/19:  Left below-the-knee amputation  Patient without complaint this AM.  No issues overnight.  Objective: Vitals:   10/01/20 0245 10/01/20 0400 10/01/20 0800 10/01/20 0856  BP: 139/73 (!) 148/89 (!) 148/79 126/78  Pulse: 71 69 65   Resp:  12 15   Temp:  97.7 F (36.5 C) (!) 97.5 F (36.4 C)   TempSrc:  Temporal Temporal   SpO2: 98% 96% 96%   Weight:      Height:        Intake/Output Summary (Last 24 hours) at 10/01/2020 1245 Last data filed at 10/01/2020 0400 Gross per 24 hour  Intake 1279 ml  Output 1250 ml  Net 29 ml   Physical Exam: A&Ox2, NAD CV: RRR Pulmonary: CTA Bilaterally Abdomen: Soft, Nontender, Nondistended Vascular:  Left lower extremity: Thigh soft.  Flexible at knee.  OR dressing is clean dry and intact.   Laboratory: CBC    Component Value Date/Time   WBC 19.5 (H) 10/01/2020 0333   HGB 8.7 (L) 10/01/2020 0333   HCT 26.8 (L) 10/01/2020 0333   PLT 366 10/01/2020 0333   BMET    Component Value Date/Time   NA 137 10/01/2020 0333   K 3.7 10/01/2020 0333   CL 108 10/01/2020 0333   CO2 21 (L) 10/01/2020 0333   GLUCOSE 187 (H) 10/01/2020 0333   BUN 21 10/01/2020 0333   CREATININE 1.50 (H) 10/01/2020 0333   CALCIUM 8.4 (L) 10/01/2020 0333   GFRNONAA 50 (L) 10/01/2020 0333   Assessment/Planning: The patient is a 70 year old male who presents with left forefoot gangrene now status post a left below the knee amputation - POD#1  1) Hemoglobin is stable. 2) Elevated white blood cell count which is normal s/p this type of surgery. AM CBC. 3) Pain control regimen seems to be adequate 4) DVT prophylaxis Heparin subq 5) PT / OT / Ambulation 6) DC Foley in AM 7) Will plan on removing OR dressing on Friday. 8) Would assume discharge back to nursing facility on Friday as well 9) Okay to transfer out of the recovery room to surgical floor without telemetry  Discussed with Dr.  Wallis Mart [facility] PA-C 10/01/2020 12:45 PM

## 2020-10-01 NOTE — Anesthesia Postprocedure Evaluation (Signed)
Anesthesia Post Note  Patient: [facility] Mel[facility]  Procedure(s) Performed: AMPUTATION BELOW KNEE (Left Knee)  Patient location during evaluation: PACU Anesthesia Type: General Level of consciousness: awake and alert and oriented Pain management: pain level controlled Vital Signs Assessment: post-procedure vital signs reviewed and stable Respiratory status: spontaneous breathing Cardiovascular status: blood pressure returned to baseline Anesthetic complications: no   No complications documented.   Last Vitals:  Vitals:   10/01/20 0800 10/01/20 0856  BP: (!) 148/79 126/78  Pulse: 65   Resp: 15   Temp: (!) 36.4 C   SpO2: 96%     Last Pain:  Vitals:   10/01/20 0800  TempSrc: Temporal  PainSc:                  Kimba Lottes

## 2020-10-01 NOTE — Evaluation (Signed)
Occupational Therapy Evaluation Patient Details Name: William Melton MRN: [id number] DOB: 31-Oct-1950 Today's Date: 10/01/2020    History of Present Illness Pt is a 70 y.o. 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  PMH includes CKD, DM, HLD, htn, L LE angio, and h/o L LE amputations.   Clinical Impression   William Melton was seen for OT evaluation this date, POD#1 from above surgery. Pt reports he was receiving care in a STR facility prior to admission. He endorses he required at least a little assistance for LB ADL management including LB dressing tasks. He states he was toileting with assist for transfers. Pt is eager to return to PLOF with less pain and improved safety and independence. Pt currently requires +2 MOD assist for mobility and LB dressing while in seated position due to pain and limited balance. Pt instructed in bed mobility, desensitization strategies for management of residual limb, & falls prevention strategies. Pt would benefit from skilled OT services including additional instruction in dressing techniques with or without assistive devices for dressing and bathing skills to support recall and carryover prior to discharge and ultimately to maximize safety, independence, and minimize falls risk and caregiver burden. Recommending STR to support return to baseline function prior to amputation.      Follow Up Recommendations  SNF    Equipment Recommendations  3 in 1 bedside commode    Recommendations for Other Services       Precautions / Restrictions Precautions Precautions: Fall Precaution Comments: Elevate L LE residual limb on pillow Restrictions Weight Bearing Restrictions: Yes LLE Weight Bearing: Non weight bearing      Mobility Bed Mobility Overal bed mobility: Needs Assistance Bed Mobility: Supine to Sit;Sit to Supine     Supine to sit: Mod assist;HOB elevated Sit to supine:  Mod assist;HOB elevated   General bed mobility comments: assist for trunk semi-supine to sitting edge of bed; assist for L>R LE sit to semi-supine in bed; vc's for technique; 2 assist to boost pt up in bed end of session (using bed pad)    Transfers Overall transfer level: Needs assistance Equipment used: Rolling walker (2 wheeled) Transfers: Sit to/from Stand Sit to Stand: Min assist;Mod assist;+2 physical assistance         General transfer comment: vc's for R LE and B UE placement; assist to initiate and come to full stand; assist to control descent sitting    Balance Overall balance assessment: Needs assistance Sitting-balance support: No upper extremity supported;Feet supported Sitting balance-Leahy Scale: Good Sitting balance - Comments: steady sitting reaching within BOS   Standing balance support: Bilateral upper extremity supported Standing balance-Leahy Scale: Poor Standing balance comment: pt requiring B UE support on RW for static standing balance; fatigues quickly                           ADL either performed or assessed with clinical judgement   ADL Overall ADL's : Needs assistance/impaired                                     Functional mobility during ADLs: Rolling walker;Moderate assistance;+2 for physical assistance General ADL Comments: Pt functionally limited by generalized weakness, decreased activity tolerance and pain in his LLE. He requires +2 STS attempts this date, and fatigues quickly during session. Anticipate +2  MOD A for LB dressing tasks. Set-up/supervision for seated UB ADL maangement.     Vision Baseline Vision/History: No visual deficits Patient Visual Report: No change from baseline       Perception     Praxis      Pertinent Vitals/Pain Pain Assessment: 0-10 Pain Score: 3  Pain Location: L LE residual limb Pain Descriptors / Indicators: Sore;Tender;Operative site guarding;Discomfort Pain Intervention(s):  Limited activity within patient's tolerance;Monitored during session;Premedicated before session     Hand Dominance Right   Extremity/Trunk Assessment Upper Extremity Assessment Upper Extremity Assessment: Generalized weakness   Lower Extremity Assessment Lower Extremity Assessment: Defer to PT evaluation;Generalized weakness;LLE deficits/detail RLE Deficits / Details: at least 3/5 AROM hip flexion, knee flexion/extension, and DF/PF LLE Deficits / Details: s/p L BKA LLE: Unable to fully assess due to pain   Cervical / Trunk Assessment Cervical / Trunk Assessment: Normal   Communication Communication Communication: No difficulties   Cognition Arousal/Alertness: Lethargic;Suspect due to medications (pt intermittently closing eyes and appearing to fall asleep during session (pt with recent pain medication)) Behavior During Therapy: Flat affect Overall Cognitive Status: Within Functional Limits for tasks assessed                                     General Comments  LLE dressings in place at start/end of session.    Exercises Total Joint Exercises Quad Sets: AROM;Strengthening;Left;10 reps;Supine Hip ABduction/ADduction: AAROM;Strengthening;Left;10 reps;Supine Other Exercises Other Exercises: Pt educated on role of OT in acute setting, falls prevention strategies, and residual limb management techniques including gentle self massage and gentle tapping. Pt eager to obtain a prosthetic and states his goal is to walk again.   Shoulder Instructions      Home Living Family/patient expects to be discharged to:: Skilled nursing facility (pt reports he has been at [facility] for past 3 weeks for rehab) Living Arrangements: Alone   Type of Home: House Home Access: Ramped entrance     Home Layout: One level     Bathroom Shower/Tub: Chief Strategy Officer: Standard     Home Equipment: Wheelchair - [employer] - 2 wheels;Cane - single point    Additional Comments: Pt reports DME is from his wife.      Prior Functioning/Environment Level of Independence: Needs assistance  Gait / Transfers Assistance Needed: Pt reports most recently walking short distances with RW with assist; also performing w/c transfers with assist (at [facility])              OT Problem List: Decreased strength;Decreased coordination;Pain;Decreased range of motion;Decreased activity tolerance;Decreased safety awareness;Impaired balance (sitting and/or standing);Decreased knowledge of use of DME or AE;Decreased knowledge of precautions      OT Treatment/Interventions: Self-care/ADL training;Therapeutic exercise;Therapeutic activities;DME and/or AE instruction;Patient/family education;Balance training    OT Goals(Current goals can be found in the care plan section) Acute Rehab OT Goals Patient Stated Goal: "To get a prosthetic and be able to walk again" OT Goal Formulation: With patient Time For Goal Achievement: 10/15/20 Potential to Achieve Goals: Good ADL Goals Pt Will Perform Grooming: sitting;with set-up;with supervision Pt Will Perform Lower Body Dressing: sit to/from stand;with min assist;with adaptive equipment (c LRAD PRN for improved safety and functional indep.) Pt Will Transfer to Toilet: stand pivot transfer;bedside commode (c LRAD PRN for improved safety and functional indep.) Pt Will Perform Toileting - Clothing Manipulation and hygiene: sit to/from stand;with adaptive equipment;with min  assist (c LRAD PRN for improved safety and functional indep.)  OT Frequency: Min 2X/week   Barriers to D/C: Decreased caregiver support;Inaccessible home environment          Co-evaluation              AM-PAC OT "6 Clicks" Daily Activity     Outcome Measure Help from another person eating meals?: A Little Help from another person taking care of personal grooming?: A Little Help from another person toileting, which includes using toliet, bedpan, or  urinal?: A Little Help from another person bathing (including washing, rinsing, drying)?: A Little Help from another person to put on and taking off regular upper body clothing?: A Little Help from another person to put on and taking off regular lower body clothing?: A Little 6 Click Score: 18   End of Session Equipment Utilized During Treatment: Gait belt;Rolling walker  Activity Tolerance: Patient tolerated treatment well;Patient limited by lethargy Patient left: in bed;with call bell/phone within reach;with bed alarm set  OT Visit Diagnosis: Other abnormalities of gait and mobility (R26.89);Muscle weakness (generalized) (M62.81);Pain Pain - Right/Left: Left Pain - part of body: Knee;Leg;Ankle and joints of foot (Phantom limb.)                Time: 1610-9604 OT Time Calculation (min): 19 min Charges:  OT General Charges $OT Visit: 1 Visit OT Evaluation $OT Eval Moderate Complexity: 1 Mod OT Treatments $Self Care/Home Management : 8-22 mins  Rockney Ghee, M.S., OTR/L Ascom: [phone] 10/01/20, 1:51 PM

## 2020-10-02 DIAGNOSIS — I70269 Atherosclerosis of native arteries of extremities with gangrene, unspecified extremity: Secondary | ICD-10-CM | POA: Diagnosis not present

## 2020-10-02 LAB — BASIC METABOLIC PANEL
Anion gap: 11 (ref 5–15)
BUN: 22 mg/dL (ref 8–23)
CO2: 21 mmol/L — ABNORMAL LOW (ref 22–32)
Calcium: 8.2 mg/dL — ABNORMAL LOW (ref 8.9–10.3)
Chloride: 107 mmol/L (ref 98–111)
Creatinine, Ser: 1.44 mg/dL — ABNORMAL HIGH (ref 0.61–1.24)
GFR, Estimated: 53 mL/min — ABNORMAL LOW (ref >60)
Glucose, Bld: 170 mg/dL — ABNORMAL HIGH (ref 70–99)
Potassium: 3.4 mmol/L — ABNORMAL LOW (ref 3.5–5.1)
Sodium: 139 mmol/L (ref 135–145)

## 2020-10-02 LAB — CBC
HCT: 24.2 % — ABNORMAL LOW (ref 39.0–52.0)
Hemoglobin: 7.9 g/dL — ABNORMAL LOW (ref 13.0–17.0)
MCH: 27 pg (ref 26.0–34.0)
MCHC: 32.6 g/dL (ref 30.0–36.0)
MCV: 82.6 fL (ref 80.0–100.0)
Platelets: 371 10*3/uL (ref 150–400)
RBC: 2.93 MIL/uL — ABNORMAL LOW (ref 4.22–5.81)
RDW: 16.5 % — ABNORMAL HIGH (ref 11.5–15.5)
WBC: 17 10*3/uL — ABNORMAL HIGH (ref 4.0–10.5)
nRBC: 0 % (ref 0.0–0.2)

## 2020-10-02 LAB — GLUCOSE, CAPILLARY
Glucose-Capillary: 121 mg/dL — ABNORMAL HIGH (ref 70–99)
Glucose-Capillary: 103 mg/dL — ABNORMAL HIGH (ref 70–99)
Glucose-Capillary: 133 mg/dL — ABNORMAL HIGH (ref 70–99)
Glucose-Capillary: 155 mg/dL — ABNORMAL HIGH (ref 70–99)

## 2020-10-02 MED ORDER — HEPARIN SODIUM (PORCINE) 5000 UNIT/ML IJ SOLN
INTRAMUSCULAR | Status: AC
  Administered 2020-10-02: 5000 [IU] via SUBCUTANEOUS
  Filled 2020-10-02: qty 1

## 2020-10-02 MED ORDER — MORPHINE SULFATE (PF) 2 MG/ML IV SOLN
INTRAVENOUS | Status: AC
  Administered 2020-10-02: 2 mg via INTRAVENOUS
  Filled 2020-10-02: qty 1

## 2020-10-02 MED ORDER — OXYCODONE-ACETAMINOPHEN 5-325 MG PO TABS
ORAL_TABLET | ORAL | Status: AC
  Administered 2020-10-02: 1 via ORAL
  Filled 2020-10-02: qty 1

## 2020-10-02 MED ORDER — ASPIRIN 81 MG PO CHEW
CHEWABLE_TABLET | ORAL | Status: AC
  Administered 2020-10-02: 81 mg
  Filled 2020-10-02: qty 1

## 2020-10-02 MED ORDER — GABAPENTIN 300 MG PO CAPS
ORAL_CAPSULE | ORAL | Status: AC
  Filled 2020-10-02: qty 1

## 2020-10-02 MED ORDER — GABAPENTIN 300 MG PO CAPS
300.0000 mg | ORAL_CAPSULE | Freq: Once | ORAL | Status: AC
  Administered 2020-10-02: 300 mg via ORAL

## 2020-10-02 MED ORDER — OXYCODONE-ACETAMINOPHEN 5-325 MG PO TABS
ORAL_TABLET | ORAL | Status: AC
  Filled 2020-10-02: qty 2

## 2020-10-02 NOTE — Progress Notes (Signed)
[facility] Daily Progress Note  Subjective: 10/01/19: Left below-the-knee amputation  Patient without complaint this AM.  No issues overnight.  Objective: Vitals:   10/01/20 2000 10/02/20 0400 10/02/20 0437 10/02/20 0743  BP: 122/73 (!) 131/93  (!) 146/74  Pulse: 66 66 71 76  Resp: 16 16  18   Temp: (!) 97.3 F (36.3 C) 98 F (36.7 C)  (!) 97.3 F (36.3 C)  TempSrc: Temporal Temporal  Temporal  SpO2: 100% 97% 97% (!) 89%  Weight:      Height:        Intake/Output Summary (Last 24 hours) at 10/02/2020 1128 Last data filed at 10/02/2020 0300 Gross per 24 hour  Intake 200 ml  Output 1050 ml  Net -850 ml   Physical Exam: A&Ox2, NAD CV: RRR Pulmonary: CTA Bilaterally Abdomen: Soft, Nontender, Nondistended Vascular:             Left lower extremity: Thigh soft.  Flexible at knee.  OR dressing is clean dry and intact.   Laboratory: CBC    Component Value Date/Time   WBC 17.0 (H) 10/02/2020 0318   HGB 7.9 (L) 10/02/2020 0318   HCT 24.2 (L) 10/02/2020 0318   PLT 371 10/02/2020 0318   BMET    Component Value Date/Time   NA 139 10/02/2020 0318   K 3.4 (L) 10/02/2020 0318   CL 107 10/02/2020 0318   CO2 21 (L) 10/02/2020 0318   GLUCOSE 170 (H) 10/02/2020 0318   BUN 22 10/02/2020 0318   CREATININE 1.44 (H) 10/02/2020 0318   CALCIUM 8.2 (L) 10/02/2020 0318   GFRNONAA 53 (L) 10/02/2020 0318   Assessment/Planning: The patient is a 70 year old male who presents with left forefoot gangrene now status post a left below the knee amputation - POD#2  1) Hemoglobin is stable. 2) White blood cell count trending downward - AM CBC. 3) Pain control regimen seems to be adequate 4) DVT prophylaxis Heparin subq 5) PT / OT / Ambulation 6) DC Foley in AM 7) Will plan on removing OR dressing on Friday. 8) Would assume discharge back to nursing facility on Friday as well 9) Okay to transfer out of the recovery room to surgical floor without  telemetry  Discussed with Dr. [facility] PA-C 10/02/2020 11:28 AM

## 2020-10-02 NOTE — Progress Notes (Signed)
Physical Therapy Treatment Patient Details Name: William Melton MRN: [id number] DOB: [birth year] Today's Date: 10/02/2020    History of Present Illness Pt is a 70 y.o. 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  PMH includes CKD, DM, HLD, htn, L LE angio, and h/o L LE amputations.    PT Comments    Pt is making gradual progress towards goals with ability to perform 3 sit<>stands with RW. Still requires +2 assist and still complains of pain. Unable to ambulate this date. Educated on knee positioning and there-ex and will plan to monitor knee ROM. Will continue to progress.   Follow Up Recommendations  SNF     Equipment Recommendations  Rolling walker with 5" wheels;3in1 (PT);Wheelchair (measurements PT);Wheelchair cushion (measurements PT)    Recommendations for Other Services       Precautions / Restrictions Precautions Precautions: Fall Precaution Comments: Elevate L LE residual limb on pillow Restrictions Weight Bearing Restrictions: Yes LLE Weight Bearing: Non weight bearing    Mobility  Bed Mobility Overal bed mobility: Needs Assistance Bed Mobility: Supine to Sit     Supine to sit: Mod assist;HOB elevated     General bed mobility comments: needs assist for B LEs. Pt able to initiate upright posture with trunk, however needs assist to finish. ONce seated at EOB, able to maintain upright posture with supervision  Transfers Overall transfer level: Needs assistance Equipment used: Rolling walker (2 wheeled) Transfers: Sit to/from Stand Sit to Stand: Mod assist;+2 physical assistance         General transfer comment: able to perform 3 stands from bed. Successful with rocking/momentum prior to stand. Able to stand for approx 20 seconds prior to fatigue.  Ambulation/Gait             General Gait Details: unable at this time   Stairs             Wheelchair Mobility     Modified Rankin (Stroke Patients Only)       Balance Overall balance assessment: Needs assistance Sitting-balance support: No upper extremity supported;Feet supported Sitting balance-Leahy Scale: Good     Standing balance support: Bilateral upper extremity supported Standing balance-Leahy Scale: Fair                              Cognition Arousal/Alertness: Awake/alert Behavior During Therapy: WFL for tasks assessed/performed Overall Cognitive Status: Within Functional Limits for tasks assessed                                        Exercises Other Exercises Other Exercises: supine ther-ex including QS, SLRs, hip abd/add, and LAQ. ALl ther-ex performed x 10 reps with min/mod assist. Other Exercises: L knee ROM 18-45 degrees, educated on knee extension in prep for prosthesis. Repositioned in bed for optimal extension    General Comments        Pertinent Vitals/Pain Pain Assessment: 0-10 Pain Score: 7  Pain Location: L LE residual limb Pain Descriptors / Indicators: Sore;Tender;Operative site guarding;Discomfort Pain Intervention(s): Limited activity within patient's tolerance    Home Living                      Prior Function            PT Goals (current  goals can now be found in the care plan section) Acute Rehab PT Goals Patient Stated Goal: "To get a prosthetic and be able to walk again" PT Goal Formulation: With patient Time For Goal Achievement: 10/15/20 Potential to Achieve Goals: Good Progress towards PT goals: Progressing toward goals    Frequency    7X/week      PT Plan Current plan remains appropriate    Co-evaluation              AM-PAC PT "6 Clicks" Mobility   Outcome Measure  Help needed turning from your back to your side while in a flat bed without using bedrails?: None Help needed moving from lying on your back to sitting on the side of a flat bed without using bedrails?: A Lot Help  needed moving to and from a bed to a chair (including a wheelchair)?: Total Help needed standing up from a chair using your arms (e.g., wheelchair or bedside chair)?: A Lot Help needed to walk in hospital room?: Total Help needed climbing 3-5 steps with a railing? : Total 6 Click Score: 11    End of Session Equipment Utilized During Treatment: Gait belt Activity Tolerance: Patient limited by fatigue Patient left: in bed;with call bell/phone within reach;with bed alarm set;Other (comment) Nurse Communication: Mobility status;Precautions PT Visit Diagnosis: Other abnormalities of gait and mobility (R26.89);Muscle weakness (generalized) (M62.81);Difficulty in walking, not elsewhere classified (R26.2);Pain Pain - Right/Left: Left Pain - part of body: Leg     Time: 1133-1200 PT Time Calculation (min) (ACUTE ONLY): 27 min  Charges:  $Therapeutic Exercise: 23-37 mins                     Elizabeth Palau, PT, DPT [phone]    William Melton 10/02/2020, 1:33 PM

## 2020-10-02 NOTE — NC FL2 (Addendum)
[place] MEDICAID FL2 LEVEL OF CARE SCREENING TOOL     IDENTIFICATION  Patient Name: William Melton Birthdate: 01/09/1951 Sex: male Admission Date (Current Location): 09/30/2020  [place] and [place] Number:  Chiropodist and Address:  [facility], [address], [place], [place] [address]      Provider Number: [id number]  Attending Physician Name and Address:  Renford Dills, MD  Relative Name and Phone Number:  Derron Pipkins [phone]    Current Level of Care: Hospital Recommended Level of Care: Skilled Nursing Facility Prior Approval Number:    Date Approved/Denied:   PASRR Number: [id number]  Discharge Plan: SNF    Current Diagnoses: Patient Active Problem List   Diagnosis Date Noted  . Long term (current) use of insulin (HCC) 09/18/2020  . Gout 09/18/2020  . 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 Type 2 diabetes mellitus with stage 3b chronic kidney disease, with long-term current use of insulin (HCC) 08/26/2020  . Chronic kidney disease   . Acute kidney injury superimposed on CKD (HCC)   . PVD (peripheral vascular disease) (HCC)   . Hyperlipidemia   . Tobacco abuse   . 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 Hypokalemia 08/16/2020  . CKD stage 3 due to type 2 diabetes mellitus (HCC) 08/16/2020  . Hyperlipidemia associated with type 2 diabetes mellitus (HCC) 08/16/2020  . Atherosclerosis of native arteries of the extremities with gangrene (HCC) 06/20/2020  . Essential hypertension 06/20/2020  . Diabetes (HCC) 06/20/2020  . Pressure ulcer of toe of left foot 05/13/2020  . Lower limb pain, inferior, right 04/10/2020  . Edema of lower extremity 04/10/2020  . Ulcer of foot (HCC) 04/10/2020    Orientation RESPIRATION BLADDER Height & Weight     Self,Time,Situation,Place  Normal Continent Weight: 79.4 kg Height:  6' (182.9 cm)  BEHAVIORAL SYMPTOMS/MOOD NEUROLOGICAL BOWEL NUTRITION STATUS       Continent Diet (Carb Modified)  AMBULATORY STATUS COMMUNICATION OF NEEDS Skin   Extensive Assist Verbally Surgical wounds (L BKA)                       Personal Care Assistance Level of Assistance  Bathing,Feeding,Dressing Bathing Assistance: Limited assistance Feeding assistance: Limited assistance Dressing Assistance: Limited assistance     Functional Limitations Info             SPECIAL CARE FACTORS FREQUENCY  PT (By licensed PT),OT (By licensed OT)     PT Frequency: 5 times per week OT Frequency: 5 times per week            Contractures Contractures Info: Not present    Additional Factors Info  Code Status,Allergies Code Status Info: Full Allergies Info: NKA           Current Medications (10/02/2020):  This is the current hospital active medication list Current Facility-Administered Medications  Medication Dose Route Frequency Provider Last Rate Last Admin  . acetaminophen (TYLENOL) tablet 325-650 mg  325-650 mg Oral Q4H PRN Schnier, Latina Craver, MD   650 mg at 10/01/20 0129   Or  . acetaminophen (TYLENOL) suppository 325-650 mg  325-650 mg Rectal Q4H PRN Schnier, Latina Craver, MD      . alum & mag hydroxide-simeth (MAALOX/MYLANTA) 200-200-20 MG/5ML suspension 15-30 mL  15-30 mL Oral Q2H PRN Schnier, Latina Craver, MD      . amLODipine (NORVASC) tablet 10 mg  10 mg Oral Daily Schnier, Earl Lites  G, MD   10 mg at 10/02/20 0746  . aspirin EC tablet 81 mg  81 mg Oral Daily Schnier, Latina Craver, MD   81 mg at 10/01/20 0908  . docusate sodium (COLACE) capsule 100 mg  100 mg Oral Daily Schnier, Latina Craver, MD   100 mg at 10/02/20 0745  . famotidine (PEPCID) IVPB 20 mg premix  20 mg Intravenous Q12H Schnier, Latina Craver, MD 100 mL/hr at 10/02/20 0908 20 mg at 10/02/20 0908  . gabapentin (NEURONTIN) 300 MG capsule           . guaiFENesin-dextromethorphan (ROBITUSSIN DM) 100-10 MG/5ML syrup 15 mL  15 mL Oral Q4H PRN Schnier, Latina Craver, MD      . heparin injection 5,000 Units   5,000 Units Subcutaneous Q8H Schnier, Latina Craver, MD   5,000 Units at 10/02/20 [phone]  . insulin aspart (novoLOG) injection 0-5 Units  0-5 Units Subcutaneous QHS Annice Needy, MD   4 Units at 09/30/20 2217  . insulin aspart (novoLOG) injection 0-9 Units  0-9 Units Subcutaneous TID WC Dew, Marlow Baars, MD      . insulin glargine (LANTUS) injection 20 Units  20 Units Subcutaneous Daily [name], RPH   20 Units at 10/02/20 0908  . magnesium sulfate IVPB 2 g 50 mL  2 g Intravenous Daily PRN Schnier, Latina Craver, MD      . metoprolol succinate (TOPROL-XL) 24 hr tablet 25 mg  25 mg Oral Daily Schnier, Latina Craver, MD   25 mg at 10/02/20 0746  . morphine 4 MG/ML injection 2-5 mg  2-5 mg Intravenous Q1H PRN Schnier, Latina Craver, MD   4 mg at 10/01/20 0156  . nicotine (NICODERM CQ - dosed in mg/24 hours) patch 21 mg  21 mg Transdermal Daily Stegmayer, Kimberly A, PA-C   21 mg at 10/02/20 0902  . ondansetron (ZOFRAN) injection 4 mg  4 mg Intravenous Q6H PRN Schnier, Latina Craver, MD      . oxyCODONE-acetaminophen (PERCOCET/ROXICET) 5-325 MG per tablet 1-2 tablet  1-2 tablet Oral Q4H PRN Schnier, Latina Craver, MD   1 tablet at 10/02/20 0437  . phenol (CHLORASEPTIC) mouth spray 1 spray  1 spray Mouth/Throat PRN Schnier, Latina Craver, MD      . polyethylene glycol (MIRALAX / GLYCOLAX) packet 17 g  17 g Oral Daily PRN Schnier, Latina Craver, MD      . potassium chloride SA (KLOR-CON) CR tablet 20-40 mEq  20-40 mEq Oral Daily PRN Schnier, Latina Craver, MD      . pravastatin (PRAVACHOL) tablet 20 mg  20 mg Oral q1800 Schnier, Latina Craver, MD   20 mg at 10/01/20 1756  . sorbitol 70 % solution 30 mL  30 mL Oral Daily PRN Schnier, Latina Craver, MD         Discharge Medications: Please see discharge summary for a list of discharge medications.  Relevant Imaging Results:  Relevant Lab Results:   Additional Information ss [id number]  Allayne Butcher, RN

## 2020-10-02 NOTE — TOC Initial Note (Signed)
Transition of Care [facility]) - Initial/Assessment Note    Patient Details  Name: William Melton MRN: [id number] Date of Birth: 1950-10-26  Transition of Care [facility]) CM/SW Contact:    Allayne Butcher, RN Phone Number: 10/02/2020, 9:20 AM  Clinical Narrative:                 Patient admitted to the hospital for left BKA.  Patient has been at [facility] for short term rehab since the end of December.  Plan for discharge will be to return to [facility].    Expected Discharge Plan: Skilled Nursing Facility Barriers to Discharge: Continued Medical Work up   Patient Goals and CMS Choice   CMS Medicare.gov Compare Post Acute Care list provided to:: Patient Choice offered to / list presented to : Patient  Expected Discharge Plan and Services Expected Discharge Plan: Skilled Nursing Facility   Discharge Planning Services: CM Consult Post Acute Care Choice: Skilled Nursing Facility Living arrangements for the past 2 months: Skilled Nursing Facility                                      Prior Living Arrangements/Services Living arrangements for the past 2 months: Skilled Nursing Facility Lives with:: Facility Resident Patient language and need for interpreter reviewed:: Yes Do you feel safe going back to the place where you live?: Yes      Need for Family Participation in Patient Care: Yes (Comment) Care giver support system in place?: Yes (comment) (son)   Criminal Activity/Legal Involvement Pertinent to Current Situation/Hospitalization: No - Comment as needed  Activities of Daily Living Home Assistive Devices/Equipment: Wheelchair ADL Screening (condition at time of admission) Patient's cognitive ability adequate to safely complete daily activities?: No Is the patient deaf or have difficulty hearing?: No Does the patient have difficulty seeing, even when wearing glasses/contacts?: No Does the patient have difficulty concentrating, remembering, or making decisions?:  No Patient able to express need for assistance with ADLs?: Yes Does the patient have difficulty dressing or bathing?: No Independently performs ADLs?: Yes (appropriate for developmental age) Does the patient have difficulty walking or climbing stairs?: Yes Weakness of Legs: Both Weakness of Arms/Hands: None  Permission Sought/Granted Permission sought to share information with : Case Manager,Family Electrical engineer Permission granted to share information with : Yes, Verbal Permission Granted  Share Information with NAME: Ramesses Crampton  Permission granted to share info w AGENCY: Licensed conveyancer granted to share info w Relationship: son     Emotional Assessment       Orientation: : Oriented to Self,Oriented to Place,Oriented to  Time,Oriented to Situation Alcohol / Substance Use: Not Applicable Psych Involvement: No (comment)  Admission diagnosis:  Atherosclerosis of native arteries of the extremities with gangrene [facility]) [I70.269] Patient Active Problem List   Diagnosis Date Noted  . Long term (current) use of insulin (HCC) 09/18/2020  . Gout 09/18/2020  . 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 Type 2 diabetes mellitus with stage 3b chronic kidney disease, with long-term current use of insulin (HCC) 08/26/2020  . Chronic kidney disease   . Acute kidney injury superimposed on CKD (HCC)   . PVD (peripheral vascular disease) (HCC)   . Hyperlipidemia   . Tobacco abuse   . 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 Hypokalemia 08/16/2020  . CKD stage 3 due to type  2 diabetes mellitus (HCC) 08/16/2020  . Hyperlipidemia associated with type 2 diabetes mellitus (HCC) 08/16/2020  . Atherosclerosis of native arteries of the extremities with gangrene (HCC) 06/20/2020  . Essential hypertension 06/20/2020  . Diabetes (HCC) 06/20/2020  . Pressure ulcer of toe of left foot 05/13/2020  . Lower limb pain, inferior, right 04/10/2020   . Edema of lower extremity 04/10/2020  . Ulcer of foot (HCC) 04/10/2020   PCP:  Altamease Oiler, FNP Pharmacy:   [facility], [place]. - [place], [place] - [address] [place] [address] Phone: [phone] Fax: [phone]     Social Determinants of Health (SDOH) Interventions    Readmission Risk Interventions No flowsheet data found.

## 2020-10-03 DIAGNOSIS — I96 Gangrene, not elsewhere classified: Secondary | ICD-10-CM

## 2020-10-03 LAB — SARS CORONAVIRUS 2 (TAT 6-24 HRS): SARS Coronavirus 2: NEGATIVE

## 2020-10-03 LAB — GLUCOSE, CAPILLARY: Glucose-Capillary: 99 mg/dL (ref 70–99)

## 2020-10-03 LAB — SURGICAL PATHOLOGY

## 2020-10-03 MED ORDER — HEPARIN SODIUM (PORCINE) 5000 UNIT/ML IJ SOLN
INTRAMUSCULAR | Status: AC
  Filled 2020-10-03: qty 1

## 2020-10-03 MED ORDER — OXYCODONE-ACETAMINOPHEN 5-325 MG PO TABS
ORAL_TABLET | ORAL | Status: AC
  Administered 2020-10-03: 1 via ORAL
  Filled 2020-10-03: qty 1

## 2020-10-03 MED ORDER — OXYCODONE-ACETAMINOPHEN 5-325 MG PO TABS: 1.0000 | ORAL_TABLET | ORAL | 0 refills | Status: AC | PRN

## 2020-10-03 MED ORDER — SODIUM CHLORIDE FLUSH 0.9 % IV SOLN
INTRAVENOUS | Status: AC
  Filled 2020-10-03: qty 30

## 2020-10-03 MED ORDER — HEPARIN SODIUM (PORCINE) 5000 UNIT/ML IJ SOLN
INTRAMUSCULAR | Status: AC
  Administered 2020-10-03: 5000 [IU] via SUBCUTANEOUS
  Filled 2020-10-03: qty 1

## 2020-10-03 NOTE — Discharge Instructions (Signed)
Vascular Surgery Discharge Instructions:  1) No engaging in strenuous activity or lifting greater than 10 pounds until you are cleared at your first postoperative visit 2) Please keep your stump clean and dry.  It is okay to gently cleanse the stump with soap and water.  Gently pat dry. 3) Daily dressing changes: ABD to incision line, covered by Kerlix, covered by Ace 4) Please encourage flexibility at the knee.  If not, the patient will experience a contracture and will need further surgery

## 2020-10-03 NOTE — Progress Notes (Signed)
Physical Therapy Treatment Patient Details Name: William Melton MRN: [id number] DOB: 09-30-1950 Today's Date: 10/03/2020    History of Present Illness Pt is a 70 y.o. 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  PMH includes CKD, DM, HLD, htn, L LE angio, and h/o L LE amputations.    PT Comments    Pt resting in bed upon PT arrival; pt requiring vc's and assist for L LE ex's semi-supine in bed.  PT/OT co-session for safe mobility.  5-6/10 L LE residual limb pain beginning/end of session at rest.  Pt requiring mod assist x2 to stand up to walker x3 trials successfully (pt unable to stand up 1st trial with 2 assist but with extra cueing pt able to stand next 3 trials).  Unable to advance to ambulation d/t pt reporting throbbing L LE residual limb pain in standing.  L knee AROM 12 degrees short of neutral extension and 70 degrees flexion.  Will continue to focus on strengthening, L knee ROM, and progressive functional mobility during hospitalization.    Follow Up Recommendations  SNF     Equipment Recommendations  Rolling walker with 5" wheels;3in1 (PT);Wheelchair (measurements PT);Wheelchair cushion (measurements PT)    Recommendations for Other Services OT consult     Precautions / Restrictions Precautions Precautions: Fall Precaution Comments: Elevate L LE residual limb on pillow Restrictions Weight Bearing Restrictions: Yes LLE Weight Bearing: Non weight bearing    Mobility  Bed Mobility Overal bed mobility: Needs Assistance Bed Mobility: Supine to Sit;Sit to Supine     Supine to sit: Mod assist;HOB elevated Sit to supine: Max assist;+2 for physical assistance   General bed mobility comments: assist for trunk semi-supine to sitting edge of bed; assist for trunk and B LE's sit to semi-supine in bed; vc's for technique  Transfers Overall transfer level: Needs assistance Equipment used:  Rolling walker (2 wheeled) Transfers: Sit to/from Stand Sit to Stand: Mod assist;+2 physical assistance         General transfer comment: pt unable to stand on 1st trial with 2 assist but then able to stand next 3 trials (only able to tolerate about 10 seconds standing d/t throbbing pain in L LE residual limb); vc's for technique, walker use, and requiring encouragement to perform  Ambulation/Gait             General Gait Details: unable at this time   Stairs             Wheelchair Mobility    Modified Rankin (Stroke Patients Only)       Balance Overall balance assessment: Needs assistance Sitting-balance support: Single extremity supported;Feet supported Sitting balance-Leahy Scale: Fair Sitting balance - Comments: steady sitting reaching within BOS   Standing balance support: Bilateral upper extremity supported Standing balance-Leahy Scale: Poor Standing balance comment: pt requiring B UE support on RW for static standing balance                            Cognition Arousal/Alertness: Awake/alert Behavior During Therapy: WFL for tasks assessed/performed Overall Cognitive Status: Within Functional Limits for tasks assessed                                        Exercises Total Joint Exercises Quad Sets: AROM;Strengthening;Left;10 reps;Supine Hip ABduction/ADduction: AAROM;Strengthening;Left;10 reps;Supine  Straight Leg Raises: AAROM;Strengthening;Left;10 reps;Supine Knee Flexion: AAROM;Strengthening;Left;10 reps;Supine    General Comments  Pt agreeable to PT session.      Pertinent Vitals/Pain Pain Assessment: 0-10 Pain Score: 6  Pain Location: L LE residual limb Pain Descriptors / Indicators: Sore;Tender;Operative site guarding;Discomfort Pain Intervention(s): Limited activity within patient's tolerance;Premedicated before session;Repositioned     Home Living                      Prior Function             PT Goals (current goals can now be found in the care plan section) Acute Rehab PT Goals Patient Stated Goal: "To get a prosthetic and be able to walk again" PT Goal Formulation: With patient Time For Goal Achievement: 10/15/20 Potential to Achieve Goals: Good Progress towards PT goals: Progressing toward goals    Frequency    7X/week      PT Plan Current plan remains appropriate    Co-evaluation PT/OT/SLP Co-Evaluation/Treatment: Yes Reason for Co-Treatment: To address functional/ADL transfers PT goals addressed during session: Mobility/safety with mobility OT goals addressed during session: ADL's and self-care      AM-PAC PT "6 Clicks" Mobility   Outcome Measure  Help needed turning from your back to your side while in a flat bed without using bedrails?: None Help needed moving from lying on your back to sitting on the side of a flat bed without using bedrails?: A Lot Help needed moving to and from a bed to a chair (including a wheelchair)?: Total Help needed standing up from a chair using your arms (e.g., wheelchair or bedside chair)?: Total Help needed to walk in hospital room?: Total Help needed climbing 3-5 steps with a railing? : Total 6 Click Score: 10    End of Session Equipment Utilized During Treatment: Gait belt Activity Tolerance: Patient tolerated treatment well Patient left: in bed;with call bell/phone within reach;with bed alarm set;Other (comment) (L LE elevated on 2 pillows; OT present) Nurse Communication: Mobility status;Precautions PT Visit Diagnosis: Other abnormalities of gait and mobility (R26.89);Muscle weakness (generalized) (M62.81);Difficulty in walking, not elsewhere classified (R26.2);Pain Pain - Right/Left: Left Pain - part of body: Leg     Time: 1012-1039 PT Time Calculation (min) (ACUTE ONLY): 27 min  Charges:  $Therapeutic Exercise: 8-22 mins                    Hendricks Limes, PT 10/03/20, 1:17 PM

## 2020-10-03 NOTE — Care Management Important Message (Signed)
Important Message  Patient Details  Name: ARVON SCHREINER MRN: [id number] Date of Birth: [birth year]   Medicare Important Message Given:  Yes     [name] 10/03/2020, 11:13 AM

## 2020-10-03 NOTE — TOC Transition Note (Signed)
Transition of Care [facility]) - CM/SW Discharge Note   Patient Details  Name: ANGELOS WASCO MRN: [id number] Date of Birth: 01/20/1951  Transition of Care [facility]) CM/SW Contact:  Allayne Butcher, RN Phone Number: 10/03/2020, 11:07 AM   Clinical Narrative:    Patient medically cleared for discharge back to [facility] for short term rehab.  Patient will be going to room 412.  [facility] has been arranged for transport. Bedside RN will call report to [phone].   Patient's son Abdulhadi updated on discharge for today, he plans to go and see his father at [facility] this afternoon.    Final next level of care: Skilled Nursing Facility Barriers to Discharge: No Barriers Identified   Patient Goals and CMS Choice   CMS Medicare.gov Compare Post Acute Care list provided to:: Patient Choice offered to / list presented to : Patient  Discharge Placement   Existing PASRR number confirmed : 10/02/20          Patient chooses bed at: [facility] Patient to be transferred to facility by: [facility] Name of family member notified: Peyton Najjar Patient and family notified of of transfer: 10/03/20  Discharge Plan and Services   Discharge Planning Services: CM Consult Post Acute Care Choice: Skilled Nursing Facility                               Social Determinants of Health (SDOH) Interventions     Readmission Risk Interventions No flowsheet data found.

## 2020-10-03 NOTE — Progress Notes (Signed)
Occupational Therapy Treatment Patient Details Name: William Melton MRN: [id number] DOB: Jan 17, 1951 Today's Date: 10/03/2020    History of present illness Pt is a 70 y.o. 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  PMH includes CKD, DM, HLD, htn, L LE angio, and h/o L LE amputations.   OT comments  William Melton was seen for OT treatment on this date, overlapping with PT for safe mobility. Upon arrival to room pt reclined in bed c PT at bedside, reporting 6/10 LLE pain. Pt agreeable to tx session. Pt requires MAX A for LBD at bed level. Left wrist IV noted to be bleeding and RN in to assess/remove while seated EOB. CGA + BUE support static sitting - MIN A dynamic sitting 2/2 posterior lean. MOD A x2 + RW sit<>stand at EOB - unable to toelrate >10seconds standing 2/2 L residual limb pain. Pt requires MAX cues for encouragement and safety. Pt making progress toward goals. Pt continues to benefit from skilled OT services to maximize return to PLOF and minimize risk of future falls, injury, caregiver burden, and readmission. Will continue to follow POC. Discharge recommendation remains appropriate.    Follow Up Recommendations  SNF    Equipment Recommendations  3 in 1 bedside commode    Recommendations for Other Services      Precautions / Restrictions Precautions Precautions: Fall Precaution Comments: Elevate L LE residual limb on pillow Restrictions Weight Bearing Restrictions: Yes LLE Weight Bearing: Non weight bearing       Mobility Bed Mobility Overal bed mobility: Needs Assistance Bed Mobility: Supine to Sit;Sit to Supine     Supine to sit: Mod assist;HOB elevated Sit to supine: Max assist;+2 for physical assistance      Transfers Overall transfer level: Needs assistance Equipment used: Rolling walker (2 wheeled) Transfers: Sit to/from Stand Sit to Stand: Mod assist;+2 physical assistance          General transfer comment: BUE support on RW and MAX cues for encouragement and technique. Tolerates ~10 seconds standing x3 attemps    Balance Overall balance assessment: Needs assistance Sitting-balance support: Bilateral upper extremity supported;Feet supported Sitting balance-Leahy Scale: Fair     Standing balance support: Bilateral upper extremity supported Standing balance-Leahy Scale: Poor                             ADL either performed or assessed with clinical judgement   ADL Overall ADL's : Needs assistance/impaired                                       General ADL Comments: MAX A for LBD at bed level. CGA + BUE support static sitting - MIN A dynamic sitting 2/2 posterior lean. MOD A x2 + RW sit<>stand at EOB - unable to toelrate >10seconds standing 2/2 L residual limb pain.               Cognition Arousal/Alertness: Awake/alert Behavior During Therapy: WFL for tasks assessed/performed Overall Cognitive Status: Within Functional Limits for tasks assessed                                          Exercises Exercises: Other exercises Other Exercises Other  Exercises: Pt educated re: OT role, DME recs, d/c recs, falls prevention, pain mgmt, edema mgmt, HEP Other Exercises: IS, sup<>sit, sit<>stand, sitting/standing balance/tolerance   Shoulder Instructions       General Comments      Pertinent Vitals/ Pain       Pain Assessment: 0-10 Pain Score: 6  Pain Location: L LE residual limb Pain Descriptors / Indicators: Sore;Tender;Operative site guarding;Discomfort Pain Intervention(s): Limited activity within patient's tolerance;Premedicated before session;Repositioned         Frequency  Min 2X/week        Progress Toward Goals  OT Goals(current goals can now be found in the care plan section)  Progress towards OT goals: Progressing toward goals  Acute Rehab OT Goals Patient Stated Goal: "To get a  prosthetic and be able to walk again" OT Goal Formulation: With patient Time For Goal Achievement: 10/15/20 Potential to Achieve Goals: Good ADL Goals Pt Will Perform Grooming: sitting;with set-up;with supervision Pt Will Perform Lower Body Dressing: sit to/from stand;with min assist;with adaptive equipment Pt Will Transfer to Toilet: stand pivot transfer;bedside commode Pt Will Perform Toileting - Clothing Manipulation and hygiene: sit to/from stand;with adaptive equipment;with min assist  Plan Discharge plan remains appropriate;Frequency remains appropriate       AM-PAC OT "6 Clicks" Daily Activity     Outcome Measure   Help from another person eating meals?: A Little Help from another person taking care of personal grooming?: A Lot Help from another person toileting, which includes using toliet, bedpan, or urinal?: A Little Help from another person bathing (including washing, rinsing, drying)?: A Little Help from another person to put on and taking off regular upper body clothing?: A Little Help from another person to put on and taking off regular lower body clothing?: A Lot 6 Click Score: 16    End of Session Equipment Utilized During Treatment: Gait belt;Rolling walker  OT Visit Diagnosis: Other abnormalities of gait and mobility (R26.89);Muscle weakness (generalized) (M62.81);Pain Pain - Right/Left: Left Pain - part of body: Knee;Leg;Ankle and joints of foot   Activity Tolerance Patient limited by pain   Patient Left in bed;with call bell/phone within reach;with bed alarm set;with nursing/sitter in room   Nurse Communication Mobility status        Time: 5615-3794 OT Time Calculation (min): 18 min  Charges: OT General Charges $OT Visit: 1 Visit OT Treatments $Self Care/Home Management : 8-22 mins  Kathie Dike, M.S. OTR/L  10/03/20, 11:30 AM  ascom [phone]

## 2020-10-03 NOTE — Discharge Summary (Signed)
[facility]    Discharge Summary  Patient ID:  [name] MRN: [id number] DOB/AGE: Jun 06, 1951 70 y.o.  Admit date: 09/30/2020 Discharge date: 10/03/2020 Date of Surgery: 09/30/2020 Surgeon: Surgeon(s): Schnier, Dolores Lory, MD  Admission Diagnosis: Atherosclerosis of native arteries of the extremities with gangrene [facility]) [I70.269]  Discharge Diagnoses:  Atherosclerosis of native arteries of the extremities with gangrene ([place]) [I70.269]  Secondary Diagnoses: Past Medical History:  Diagnosis Date  . Arthritis   . CKD (chronic kidney disease), stage III ([place])   . Diabetes mellitus without complication ([place])   . Hyperlipidemia   . Hypertension    Procedure(s): 09/30/20:  Left below-the-knee amputation  Discharged Condition: Good  HPI / Hospital Course:  William Melton is a 70 year old male who presents with left forefoot gangrene. The patient is scheduled for a left below-the-knee amputation. I discussed in depth with the patient the risks, benefits, and alternatives to this procedure. The patient is aware that the risk of this operation included but are not limited to: bleeding, infection, myocardial infarction, stroke, death, failure to heal amputation wound, and possible need for more proximal amputation.  The patient is aware of the risks and agrees proceed forward with the procedure. On 09/30/20, the patient underwent:  Left below-the-knee amputation  The patient tolerated procedure well was transferred from the operating room to the recovery room without issue.  Patient's night of surgery was unremarkable.  During the patient's brief stay, his diet was advanced, his Foley was removed and he was urinating on his own, his pain was controlled the use of p.o. pain medication he was ambulating at baseline.  PT/OT recommend discharge back to rehab day of discharge the patient was afebrile with stable vital signs.  Physical Exam:  A&Ox2, NAD CV:  RRR Pulmonary: CTA Bilaterally Abdomen: Soft, Nontender, Nondistended Vascular: Left lower extremity:Thigh soft. Flexible at knee. OR dressing removed.  Stump is healthy.  Labs: As below  Complications: None  Consults: None  Significant Diagnostic Studies: CBC Lab Results  Component Value Date   WBC 17.0 (H) 10/02/2020   HGB 7.9 (L) 10/02/2020   HCT 24.2 (L) 10/02/2020   MCV 82.6 10/02/2020   PLT 371 10/02/2020   BMET    Component Value Date/Time   NA 139 10/02/2020 0318   K 3.4 (L) 10/02/2020 0318   CL 107 10/02/2020 0318   CO2 21 (L) 10/02/2020 0318   GLUCOSE 170 (H) 10/02/2020 0318   BUN 22 10/02/2020 0318   CREATININE 1.44 (H) 10/02/2020 0318   CALCIUM 8.2 (L) 10/02/2020 0318   GFRNONAA 53 (L) 10/02/2020 0318   COAG Lab Results  Component Value Date   INR 1.1 09/29/2020   INR 1.2 08/16/2020   Disposition:  Discharge to :Home  Allergies as of 10/03/2020   No Known Allergies     Medication List    TAKE these medications   Alcohol Swabs 70 % Pads to check fasting blood sugar   allopurinol 100 MG tablet Commonly known as: ZYLOPRIM Take 100 mg by mouth daily.   amLODipine 10 MG tablet Commonly known as: NORVASC Take 10 mg by mouth daily.   aspirin EC 81 MG tablet Take 1 tablet (81 mg total) by mouth daily. Swallow whole.   insulin glargine 100 UNIT/ML injection Commonly known as: Lantus Inject 0.2 mLs (20 Units total) into the skin daily.   lovastatin 20 MG tablet Commonly known as: MEVACOR Take 20 mg by mouth at bedtime.  metoprolol succinate 25 MG 24 hr tablet Commonly known as: TOPROL-XL Take 1 tablet (25 mg total) by mouth daily.   nicotine 21 mg/24hr patch Commonly known as: NICODERM CQ - dosed in mg/24 hours Place 21 mg onto the skin daily.   oxyCODONE-acetaminophen 5-325 MG tablet Commonly known as: PERCOCET/ROXICET Take 1-2 tablets by mouth every 4 (four) hours as needed for moderate pain.   True Metrix Air  Glucose Meter w/Device Kit to check fasting blood sugar            Durable Medical Equipment  (From admission, onward)         Start     Ordered   10/02/20 1128  For home use only DME 3 n 1  Once        10/02/20 1127   10/02/20 1128  For home use only DME Walker rolling  Once       Question Answer Comment  Walker: With 5 Inch Wheels   Patient needs a walker to treat with the following condition PAD (peripheral artery disease) ([place])      10/02/20 1127   10/02/20 1128  For home use only DME wheelchair cushion (seat and back)  Once        10/02/20 1127         Verbal and written Discharge instructions given to the patient. Wound care per Discharge AVS  Follow-up Information    Kris Hartmann, NP Follow up in 3 week(s).   Specialty: Vascular Surgery Why: First post-op check. No studies.  Contact information: [place] [address] [phone]              Signed: Sela Hua, PA-C  10/03/2020, 10:22 AM

## 2020-10-22 ENCOUNTER — Other Ambulatory Visit: Payer: Self-pay

## 2020-10-22 ENCOUNTER — Encounter (INDEPENDENT_AMBULATORY_CARE_PROVIDER_SITE_OTHER): Payer: Self-pay | Admitting: Nurse Practitioner

## 2020-10-22 ENCOUNTER — Ambulatory Visit (INDEPENDENT_AMBULATORY_CARE_PROVIDER_SITE_OTHER): Payer: Medicare HMO | Admitting: Nurse Practitioner

## 2020-10-22 VITALS — BP 130/87 | HR 87 | Resp 16

## 2020-10-22 DIAGNOSIS — Z89512 Acquired absence of left leg below knee: Secondary | ICD-10-CM

## 2020-10-22 DIAGNOSIS — I10 Essential (primary) hypertension: Secondary | ICD-10-CM

## 2020-10-26 ENCOUNTER — Encounter (INDEPENDENT_AMBULATORY_CARE_PROVIDER_SITE_OTHER): Payer: Self-pay | Admitting: Nurse Practitioner

## 2020-10-26 NOTE — Progress Notes (Signed)
Subjective:    [name], male    DOB: 01/08/1951, 70 y.o.   MRN: [id number] Chief Complaint  Patient presents with  . Follow-up    [facility] 3wk post op BKA    The patient presents to the office for evaluation of left below-knee amputation. Amputation was done on 09/30/2020 due to severe gangrenous changes of the left lower extremity. The patient is currently in good spirits. He has been working with physical therapy and is able to keep his leg straight as possible. He denies any severe pain or phantom limb pain. There is no evidence of dehiscence over the wound. Scabbing and some areas but overall the wound is doing very well.   Review of Systems  Skin: Positive for wound.  All other systems reviewed and are negative.      Objective:   Physical Exam Vitals reviewed.  HENT:     Head: Normocephalic.  Cardiovascular:     Rate and Rhythm: Normal rate.     Pulses: Normal pulses.  Pulmonary:     Effort: Pulmonary effort is normal.  Musculoskeletal:     Left Lower Extremity: Left leg is amputated below knee.  Skin:    General: Skin is warm and dry.  Neurological:     Mental Status: He is alert and oriented to person, place, and time.     Motor: Weakness present.     Gait: Gait abnormal.  Psychiatric:        Mood and Affect: Mood normal.        Behavior: Behavior normal.        Thought Content: Thought content normal.        Judgment: Judgment normal.     BP 130/87 (BP Location: Right Arm)   Pulse 87   Resp 16   Past Medical History:  Diagnosis Date  . Arthritis   . CKD (chronic kidney disease), stage III ([place])   . Diabetes mellitus without complication ([place])   . Hyperlipidemia   . Hypertension     Social History   Socioeconomic History  . Marital status: Widowed    Spouse name: Not on file  . Number of children: 1  . Years of education: Not on file  . Highest education level: Not on file  Occupational History  . Occupation: Retired      Comment: Associate Professor  Tobacco Use  . Smoking status: Former Smoker    Packs/day: 1.00    Types: Cigarettes  . Smokeless tobacco: Never Used  Vaping Use  . Vaping Use: Never used  Substance and Sexual Activity  . Alcohol use: Yes    Alcohol/week: 4.0 standard drinks    Types: 4 Cans of beer per week  . Drug use: Never  . Sexual activity: Not on file  Other Topics Concern  . Not on file  Social History Narrative   Currently residing at [employer] and healthcare   Social Determinants of Health   Financial Resource Strain: Not on file  Food Insecurity: Not on file  Transportation Needs: Not on file  Physical Activity: Not on file  Stress: Not on file  Social Connections: Not on file  Intimate Partner Violence: Not on file    Past Surgical History:  Procedure Laterality Date  . AMPUTATION Left 08/18/2020   Procedure: PARTIAL AMPUTATION  OF 4TH AND 5TH RAY;  Surgeon: Caroline More, DPM;  Location: [facility];  Service: Podiatry;  Laterality: Left;  . AMPUTATION Left  09/30/2020   Procedure: AMPUTATION BELOW KNEE;  Surgeon: Katha Cabal, MD;  Location: [facility];  Service: Vascular;  Laterality: Left;  . LOWER EXTREMITY ANGIOGRAPHY Left 08/05/2020   Procedure: LOWER EXTREMITY ANGIOGRAPHY;  Surgeon: Katha Cabal, MD;  Location: [place] [facility];  Service: Cardiovascular;  Laterality: Left;  . LOWER EXTREMITY ANGIOGRAPHY Left 08/19/2020   Procedure: Lower Extremity Angiography;  Surgeon: Katha Cabal, MD;  Location: [place] [facility];  Service: Cardiovascular;  Laterality: Left;  . TRANSMETATARSAL AMPUTATION Left 08/22/2020   Procedure: TRANSMETATARSAL AMPUTATION-Left Foot;  Surgeon: Samara Deist, DPM;  Location: [facility];  Service: Podiatry;  Laterality: Left;    Family History  Problem Relation Age of Onset  . Heart disease Mother   . COPD Father   . Hyperlipidemia Father     No Known Allergies  CBC Latest Ref Rng & Units 10/02/2020  10/01/2020 10/01/2020  WBC 4.0 - 10.5 K/uL 17.0(H) 19.5(H) 17.8(H)  Hemoglobin 13.0 - 17.0 g/dL 7.9(L) 8.7(L) 9.0(L)  Hematocrit 39.0 - 52.0 % 24.2(L) 26.8(L) 27.7(L)  Platelets 150 - 400 K/uL 371 366 365      CMP     Component Value Date/Time   NA 139 10/02/2020 0318   K 3.4 (L) 10/02/2020 0318   CL 107 10/02/2020 0318   CO2 21 (L) 10/02/2020 0318   GLUCOSE 170 (H) 10/02/2020 0318   BUN 22 10/02/2020 0318   CREATININE 1.44 (H) 10/02/2020 0318   CALCIUM 8.2 (L) 10/02/2020 0318   PROT 7.8 08/16/2020 1721   ALBUMIN 1.9 (L) 08/22/2020 0443   AST 16 08/16/2020 1721   ALT 11 08/16/2020 1721   ALKPHOS 72 08/16/2020 1721   BILITOT 1.3 (H) 08/16/2020 1721   GFRNONAA 53 (L) 10/02/2020 0318     No results found.     Assessment & Plan:   1. Hx of BKA, left ([place]) The patient is doing well following left below-knee amputation. He denies any severe phantom limb pain. He denies any severe pain at all. He is doing very well at keeping his leg straight. I will have the patient return in 6 weeks to reevaluate his stump for complete wound healing.  2. Essential hypertension Continue antihypertensive medications as already ordered, these medications have been reviewed and there are no changes at this time.    Current Outpatient Medications on File Prior to Visit  Medication Sig Dispense Refill  . Alcohol Swabs 70 % PADS to check fasting blood sugar    . allopurinol (ZYLOPRIM) 100 MG tablet Take 100 mg by mouth daily.     Marland Kitchen amLODipine (NORVASC) 10 MG tablet Take 10 mg by mouth daily.     Marland Kitchen aspirin EC 81 MG tablet Take 1 tablet (81 mg total) by mouth daily. Swallow whole. 150 tablet 2  . Blood Glucose Monitoring Suppl (TRUE METRIX AIR GLUCOSE METER) w/Device KIT to check fasting blood sugar    . insulin glargine (LANTUS) 100 UNIT/ML injection Inject 0.2 mLs (20 Units total) into the skin daily. 10 mL 11  . lovastatin (MEVACOR) 20 MG tablet Take 20 mg by mouth at bedtime.    . metoprolol  succinate (TOPROL-XL) 25 MG 24 hr tablet Take 1 tablet (25 mg total) by mouth daily. 30 tablet 0  . nicotine (NICODERM CQ - DOSED IN MG/24 HOURS) 21 mg/24hr patch Place 21 mg onto the skin daily.    Marland Kitchen oxyCODONE-acetaminophen (PERCOCET/ROXICET) 5-325 MG tablet Take 1-2 tablets by mouth every 4 (four) hours as  needed for moderate pain. (Patient not taking: Reported on 10/22/2020) 50 tablet 0   No current facility-administered medications on file prior to visit.    There are no Patient Instructions on file for this visit. No follow-ups on file.   Kris Hartmann, NP

## 2020-11-26 ENCOUNTER — Telehealth (INDEPENDENT_AMBULATORY_CARE_PROVIDER_SITE_OTHER): Payer: Self-pay

## 2020-11-26 NOTE — Telephone Encounter (Signed)
The pt's home care nurse called an wanted the pt to be fitted for a shrinker on his Lt stump on his upcoming appointment I mae her aware I will make the NP a aware an gave her the pt's next appointment an NP 'sname that the pt will see.

## 2020-12-03 ENCOUNTER — Ambulatory Visit (INDEPENDENT_AMBULATORY_CARE_PROVIDER_SITE_OTHER): Payer: Medicare HMO | Admitting: Nurse Practitioner

## 2020-12-03 ENCOUNTER — Other Ambulatory Visit: Payer: Self-pay

## 2020-12-03 VITALS — BP 124/74 | HR 73 | Ht 72.0 in | Wt 165.0 lb

## 2020-12-03 DIAGNOSIS — I10 Essential (primary) hypertension: Secondary | ICD-10-CM

## 2020-12-03 DIAGNOSIS — Z89512 Acquired absence of left leg below knee: Secondary | ICD-10-CM

## 2020-12-07 ENCOUNTER — Encounter (INDEPENDENT_AMBULATORY_CARE_PROVIDER_SITE_OTHER): Payer: Self-pay | Admitting: Nurse Practitioner

## 2020-12-07 NOTE — Progress Notes (Signed)
Subjective:    [name], male    DOB: Sep 28, 1950, 70 y.o.   MRN: [id number] Chief Complaint  Patient presents with  . Follow-up    6 wks no studies    William Melton is a 70 year old male that presents to the office for evaluation of his left below-knee amputation.  Amputation was done on 09/30/2020.  This time the wound has completely healed.  The patient has been doing well with physical therapy and keeping his leg straight as possible.  He denies any severe pain or phantom limb pain.  The patient is extremely motivated to obtain his prosthesis at this time.  Overall he is doing well.     Review of Systems  Musculoskeletal: Positive for gait problem.  Neurological: Positive for weakness.  All other systems reviewed and are negative.      Objective:   Physical Exam Vitals reviewed.  HENT:     Head: Normocephalic.  Cardiovascular:     Rate and Rhythm: Normal rate.     Pulses: Decreased pulses.  Pulmonary:     Effort: Pulmonary effort is normal.  Musculoskeletal:     Left Lower Extremity: Left leg is amputated below knee.  Skin:    General: Skin is warm.  Neurological:     Mental Status: He is alert and oriented to person, place, and time.  Psychiatric:        Mood and Affect: Mood normal.        Behavior: Behavior normal.        Thought Content: Thought content normal.        Judgment: Judgment normal.     BP 124/74   Pulse 73   Ht 6' (1.829 m)   Wt 165 lb (74.8 kg)   BMI 22.38 kg/m   Past Medical History:  Diagnosis Date  . Arthritis   . CKD (chronic kidney disease), stage III ([place])   . Diabetes mellitus without complication ([place])   . Hyperlipidemia   . Hypertension     Social History   Socioeconomic History  . Marital status: Widowed    Spouse name: Not on file  . Number of children: 1  . Years of education: Not on file  . Highest education level: Not on file  Occupational History  . 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 Smoking status: Former Smoker    Packs/day: 1.00    Types: Cigarettes  . Smokeless tobacco: Never Used  Vaping Use  . Vaping Use: Never used  Substance and Sexual Activity  . Alcohol use: Yes    Alcohol/week: 4.0 standard drinks    Types: 4 Cans of beer per week  . Drug use: Never  . Sexual activity: Not on file  Other Topics Concern  . Not on file  Social History Narrative   Currently residing at [employer] and healthcare   Social Determinants of Health   Financial Resource Strain: Not on file  Food Insecurity: Not on file  Transportation Needs: Not on file  Physical Activity: Not on file  Stress: Not on file  Social Connections: Not on file  Intimate Partner Violence: Not on file    Past Surgical History:  Procedure Laterality Date  . AMPUTATION Left 08/18/2020   Procedure: PARTIAL AMPUTATION  OF 4TH AND 5TH RAY;  Surgeon: Caroline More, DPM;  Location: [facility];  Service: Podiatry;  Laterality: Left;  . AMPUTATION Left 09/30/2020  Procedure: AMPUTATION BELOW KNEE;  Surgeon: Katha Cabal, MD;  Location: [facility];  Service: Vascular;  Laterality: Left;  . LOWER EXTREMITY ANGIOGRAPHY Left 08/05/2020   Procedure: LOWER EXTREMITY ANGIOGRAPHY;  Surgeon: Katha Cabal, MD;  Location: [place] [facility];  Service: Cardiovascular;  Laterality: Left;  . LOWER EXTREMITY ANGIOGRAPHY Left 08/19/2020   Procedure: Lower Extremity Angiography;  Surgeon: Katha Cabal, MD;  Location: [place] [facility];  Service: Cardiovascular;  Laterality: Left;  . TRANSMETATARSAL AMPUTATION Left 08/22/2020   Procedure: TRANSMETATARSAL AMPUTATION-Left Foot;  Surgeon: Samara Deist, DPM;  Location: [facility];  Service: Podiatry;  Laterality: Left;    Family History  Problem Relation Age of Onset  . Heart disease Mother   . COPD Father   . Hyperlipidemia Father     No Known Allergies  CBC Latest Ref Rng & Units 10/02/2020 10/01/2020 10/01/2020   WBC 4.0 - 10.5 K/uL 17.0(H) 19.5(H) 17.8(H)  Hemoglobin 13.0 - 17.0 g/dL 7.9(L) 8.7(L) 9.0(L)  Hematocrit 39.0 - 52.0 % 24.2(L) 26.8(L) 27.7(L)  Platelets 150 - 400 K/uL 371 366 365      CMP     Component Value Date/Time   NA 139 10/02/2020 0318   K 3.4 (L) 10/02/2020 0318   CL 107 10/02/2020 0318   CO2 21 (L) 10/02/2020 0318   GLUCOSE 170 (H) 10/02/2020 0318   BUN 22 10/02/2020 0318   CREATININE 1.44 (H) 10/02/2020 0318   CALCIUM 8.2 (L) 10/02/2020 0318   PROT 7.8 08/16/2020 1721   ALBUMIN 1.9 (L) 08/22/2020 0443   AST 16 08/16/2020 1721   ALT 11 08/16/2020 1721   ALKPHOS 72 08/16/2020 1721   BILITOT 1.3 (H) 08/16/2020 1721   GFRNONAA 53 (L) 10/02/2020 0318     No results found.     Assessment & Plan:   1. Hx of BKA, left ([place]) The patient's left below-knee amputation has completely healed at this time.  The patient is very motivated to work with physical therapy and obtain his prosthetic.  The patient is a left transtibial amputee.  He does not have any comorbidities that will impact his mobility or his ability to function with a prosthesis at the K3 level.  Because the patient is a brand-new amputee he is in need of this prosthesis.  He regularly communicates very strong desire to get his prosthesis and to begin walking again.  Prior to his amputation he was not utilizing any assistive devices.  He should be a K3 level ambulator.   We will place a referral for prosthetic as well as physical therapy evaluation and treatment.  The patient will follow up with [place] in 3 months for ABIs.  2. Essential hypertension Continue antihypertensive medications as already ordered, these medications have been reviewed and there are no changes at this time.    Current Outpatient Medications on File Prior to Visit  Medication Sig Dispense Refill  . Alcohol Swabs 70 % PADS to check fasting blood sugar    . allopurinol (ZYLOPRIM) 100 MG tablet Take 100 mg by mouth daily.     Marland Kitchen  amLODipine (NORVASC) 10 MG tablet Take 10 mg by mouth daily.     Marland Kitchen aspirin EC 81 MG tablet Take 1 tablet (81 mg total) by mouth daily. Swallow whole. 150 tablet 2  . Blood Glucose Monitoring Suppl (TRUE METRIX AIR GLUCOSE METER) w/Device KIT to check fasting blood sugar    . insulin glargine (LANTUS) 100 UNIT/ML injection Inject 0.2 mLs (20  Units total) into the skin daily. 10 mL 11  . lovastatin (MEVACOR) 20 MG tablet Take 20 mg by mouth at bedtime.    . metoprolol succinate (TOPROL-XL) 25 MG 24 hr tablet Take 1 tablet (25 mg total) by mouth daily. 30 tablet 0  . nicotine (NICODERM CQ - DOSED IN MG/24 HOURS) 21 mg/24hr patch Place 21 mg onto the skin daily.    Marland Kitchen oxyCODONE-acetaminophen (PERCOCET/ROXICET) 5-325 MG tablet Take 1-2 tablets by mouth every 4 (four) hours as needed for moderate pain. 50 tablet 0   No current facility-administered medications on file prior to visit.    There are no Patient Instructions on file for this visit. No follow-ups on file.   Kris Hartmann, NP

## 2020-12-09 ENCOUNTER — Telehealth (INDEPENDENT_AMBULATORY_CARE_PROVIDER_SITE_OTHER): Payer: Self-pay | Admitting: Vascular Surgery

## 2020-12-09 NOTE — Telephone Encounter (Signed)
Called to check status of paperwork that was left for provider to fill out.

## 2020-12-31 ENCOUNTER — Other Ambulatory Visit (INDEPENDENT_AMBULATORY_CARE_PROVIDER_SITE_OTHER): Payer: Self-pay | Admitting: Nurse Practitioner

## 2020-12-31 NOTE — Telephone Encounter (Signed)
Patient left VM inquiring about paper work that was sent to Dr. Gilda Crease to fill out, patient is wanting to know if paper work has been mailed out. Please advise.

## 2021-01-01 NOTE — Telephone Encounter (Signed)
Patient will coming by the office to pick the paper work

## 2021-01-07 ENCOUNTER — Telehealth (INDEPENDENT_AMBULATORY_CARE_PROVIDER_SITE_OTHER): Payer: Self-pay | Admitting: Vascular Surgery

## 2021-01-07 NOTE — Telephone Encounter (Signed)
Patient called in inquiring about his prescription that was dropped off.  Patient is wanting to know if it's been sent off and when will he hear back?  Please advise.

## 2021-01-07 NOTE — Telephone Encounter (Signed)
The pt's RX was sent over on 12/31/20 I printed off the Rx and have given it to the the front desk so the pt can Pick it up.

## 2021-01-07 NOTE — Telephone Encounter (Signed)
The  was printed off and left at the front desk for  the pt to pick up.

## 2021-01-07 NOTE — Telephone Encounter (Signed)
Patient called in inquiring about his prescription that was dropped off.  Patient is wanting to know if it's been sent off and when will he hear back?  I believe patient came in about a week ago needing a prescription for [facility]  Please advise.

## 2021-03-05 ENCOUNTER — Encounter (INDEPENDENT_AMBULATORY_CARE_PROVIDER_SITE_OTHER): Payer: Medicare HMO

## 2021-03-05 ENCOUNTER — Ambulatory Visit (INDEPENDENT_AMBULATORY_CARE_PROVIDER_SITE_OTHER): Payer: Medicare HMO | Admitting: Nurse Practitioner

## 2021-03-12 ENCOUNTER — Other Ambulatory Visit (INDEPENDENT_AMBULATORY_CARE_PROVIDER_SITE_OTHER): Payer: Self-pay | Admitting: Vascular Surgery

## 2021-03-17 ENCOUNTER — Other Ambulatory Visit (INDEPENDENT_AMBULATORY_CARE_PROVIDER_SITE_OTHER): Payer: Self-pay | Admitting: Nurse Practitioner

## 2021-03-17 DIAGNOSIS — Z95828 Presence of other vascular implants and grafts: Secondary | ICD-10-CM

## 2021-03-17 DIAGNOSIS — I739 Peripheral vascular disease, unspecified: Secondary | ICD-10-CM

## 2021-03-18 ENCOUNTER — Ambulatory Visit (INDEPENDENT_AMBULATORY_CARE_PROVIDER_SITE_OTHER): Payer: Medicare HMO

## 2021-03-18 ENCOUNTER — Ambulatory Visit (INDEPENDENT_AMBULATORY_CARE_PROVIDER_SITE_OTHER): Payer: Medicare HMO | Admitting: Nurse Practitioner

## 2021-03-18 ENCOUNTER — Encounter (INDEPENDENT_AMBULATORY_CARE_PROVIDER_SITE_OTHER): Payer: Self-pay | Admitting: Nurse Practitioner

## 2021-03-18 ENCOUNTER — Other Ambulatory Visit: Payer: Self-pay

## 2021-03-18 VITALS — BP 147/85 | HR 82 | Resp 16

## 2021-03-18 DIAGNOSIS — Z89512 Acquired absence of left leg below knee: Secondary | ICD-10-CM | POA: Diagnosis not present

## 2021-03-18 DIAGNOSIS — I739 Peripheral vascular disease, unspecified: Secondary | ICD-10-CM | POA: Diagnosis not present

## 2021-03-18 DIAGNOSIS — Z95828 Presence of other vascular implants and grafts: Secondary | ICD-10-CM

## 2021-03-18 DIAGNOSIS — I10 Essential (primary) hypertension: Secondary | ICD-10-CM | POA: Diagnosis not present

## 2021-03-22 ENCOUNTER — Encounter (INDEPENDENT_AMBULATORY_CARE_PROVIDER_SITE_OTHER): Payer: Self-pay | Admitting: Nurse Practitioner

## 2021-03-22 NOTE — Progress Notes (Signed)
Subjective:    [name], male    DOB: 1951/07/24, 70 y.o.   MRN: [id number] Chief Complaint  Patient presents with   Follow-up    Ultrasound follow up    William Melton is a 70 year old male returns to the office for followup and review of the noninvasive studies. There have been no interval changes in lower extremity symptoms. No interval shortening of the patient's claudication distance or development of rest pain symptoms. No new ulcers or wounds have occurred since the last visit.  The patient is a left below-knee amputation and is doing well with his new prosthetic.  There have been no significant changes to the patient's overall health care.  The patient denies amaurosis fugax or recent TIA symptoms. There are no recent neurological changes noted. The patient denies history of DVT, PE or superficial thrombophlebitis. The patient denies recent episodes of angina or shortness of breath.   ABI Rt=1.13 and Lt=n/a  (previous ABI's Rt=[place] and Lt=n/a) Duplex ultrasound of the bilateral tibial arteries reveals biphasic waveforms   Review of Systems  Musculoskeletal:  Positive for gait problem.  All other systems reviewed and are negative.     Objective:   Physical Exam Vitals reviewed.  HENT:     Head: Normocephalic.  Cardiovascular:     Rate and Rhythm: Normal rate.     Pulses: Decreased pulses.  Pulmonary:     Effort: Pulmonary effort is normal.  Musculoskeletal:     Left Lower Extremity: Left leg is amputated below knee.  Neurological:     Mental Status: He is alert and oriented to person, place, and time.     Motor: Weakness present.     Gait: Gait abnormal.  Psychiatric:        Mood and Affect: Mood normal.        Behavior: Behavior normal.        Thought Content: Thought content normal.        Judgment: Judgment normal.    BP (!) 147/85 (BP Location: Left Arm)   Pulse 82   Resp 16   Past Medical History:  Diagnosis Date   Arthritis    CKD  (chronic kidney disease), stage III ([facility])    Diabetes mellitus without complication ([place])    Hyperlipidemia    Hypertension     Social History   Socioeconomic History   Marital status: Widowed    Spouse name: Not on file   Number of children: 1   Years of education: Not on file   Highest education level: Not on file  Occupational History   Occupation: Retired     Comment: Associate Professor  Tobacco Use   Smoking status: Former    Packs/day: 1.00    Types: Cigarettes   Smokeless tobacco: Never  Vaping Use   Vaping Use: Never used  Substance and Sexual Activity   Alcohol use: Yes    Alcohol/week: 4.0 standard drinks    Types: 4 Cans of beer per week   Drug use: Never   Sexual activity: Not on file  Other Topics Concern   Not on file  Social History Narrative   Currently residing at [employer] and healthcare   Social Determinants of Health   Financial Resource Strain: Not on file  Food Insecurity: Not on file  Transportation Needs: Not on file  Physical Activity: Not on file  Stress: Not on file  Social Connections: Not on file  Intimate Partner Violence: Not  on file    Past Surgical History:  Procedure Laterality Date   AMPUTATION Left 08/18/2020   Procedure: PARTIAL AMPUTATION  OF 4TH AND 5TH RAY;  Surgeon: Caroline More, DPM;  Location: [facility];  Service: Podiatry;  Laterality: Left;   AMPUTATION Left 09/30/2020   Procedure: AMPUTATION BELOW KNEE;  Surgeon: Katha Cabal, MD;  Location: [facility];  Service: Vascular;  Laterality: Left;   LOWER EXTREMITY ANGIOGRAPHY Left 08/05/2020   Procedure: LOWER EXTREMITY ANGIOGRAPHY;  Surgeon: Katha Cabal, MD;  Location: [place] [facility];  Service: Cardiovascular;  Laterality: Left;   LOWER EXTREMITY ANGIOGRAPHY Left 08/19/2020   Procedure: Lower Extremity Angiography;  Surgeon: Katha Cabal, MD;  Location: [place] [facility];  Service: Cardiovascular;  Laterality: Left;   TRANSMETATARSAL  AMPUTATION Left 08/22/2020   Procedure: TRANSMETATARSAL AMPUTATION-Left Foot;  Surgeon: Samara Deist, DPM;  Location: [facility];  Service: Podiatry;  Laterality: Left;    Family History  Problem Relation Age of Onset   Heart disease Mother    COPD Father    Hyperlipidemia Father     No Known Allergies  CBC Latest Ref Rng & Units 10/02/2020 10/01/2020 10/01/2020  WBC 4.0 - 10.5 K/uL 17.0(H) 19.5(H) 17.8(H)  Hemoglobin 13.0 - 17.0 g/dL 7.9(L) 8.7(L) 9.0(L)  Hematocrit 39.0 - 52.0 % 24.2(L) 26.8(L) 27.7(L)  Platelets 150 - 400 K/uL 371 366 365      CMP     Component Value Date/Time   NA 139 10/02/2020 0318   K 3.4 (L) 10/02/2020 0318   CL 107 10/02/2020 0318   CO2 21 (L) 10/02/2020 0318   GLUCOSE 170 (H) 10/02/2020 0318   BUN 22 10/02/2020 0318   CREATININE 1.44 (H) 10/02/2020 0318   CALCIUM 8.2 (L) 10/02/2020 0318   PROT 7.8 08/16/2020 1721   ALBUMIN 1.9 (L) 08/22/2020 0443   AST 16 08/16/2020 1721   ALT 11 08/16/2020 1721   ALKPHOS 72 08/16/2020 1721   BILITOT 1.3 (H) 08/16/2020 1721   GFRNONAA 53 (L) 10/02/2020 0318     No results found.     Assessment & Plan:   1. PVD (peripheral vascular disease) ([place])  Recommend:  The patient has evidence of atherosclerosis of the lower extremities with claudication.  The patient does not voice lifestyle limiting changes at this point in time.  Noninvasive studies do not suggest clinically significant change.  No invasive studies, angiography or surgery at this time The patient should continue walking and begin a more formal exercise program.  The patient should continue antiplatelet therapy and aggressive treatment of the lipid abnormalities  No changes in the patient's medications at this time  The patient should continue wearing graduated compression socks 10-15 mmHg strength to control the mild edema.      2. Essential hypertension Continue antihypertensive medications as already ordered, these medications have  been reviewed and there are no changes at this time.   3. Hx of BKA, left ([place]) The patient is doing well.  He has just received his new prosthetic and is ambulating with a walker.   Current Outpatient Medications on File Prior to Visit  Medication Sig Dispense Refill   Alcohol Swabs 70 % PADS to check fasting blood sugar     allopurinol (ZYLOPRIM) 100 MG tablet Take 100 mg by mouth daily.      aspirin EC 81 MG tablet Take 1 tablet (81 mg total) by mouth daily. Swallow whole. 150 tablet 2   Blood Glucose Monitoring Suppl (  TRUE METRIX AIR GLUCOSE METER) w/Device KIT to check fasting blood sugar     clopidogrel (PLAVIX) 75 MG tablet Take 75 mg by mouth daily.     insulin glargine (LANTUS) 100 UNIT/ML injection Inject 0.2 mLs (20 Units total) into the skin daily. 10 mL 11   lovastatin (MEVACOR) 20 MG tablet Take 20 mg by mouth at bedtime.     potassium chloride (KLOR-CON) 10 MEQ tablet Take 10 mEq by mouth daily.     amLODipine (NORVASC) 10 MG tablet Take 10 mg by mouth daily.  (Patient not taking: Reported on 03/18/2021)     metoprolol succinate (TOPROL-XL) 25 MG 24 hr tablet Take 1 tablet (25 mg total) by mouth daily. (Patient not taking: Reported on 03/18/2021) 30 tablet 0   nicotine (NICODERM CQ - DOSED IN MG/24 HOURS) 21 mg/24hr patch Place 21 mg onto the skin daily. (Patient not taking: Reported on 03/18/2021)     oxyCODONE-acetaminophen (PERCOCET/ROXICET) 5-325 MG tablet Take 1-2 tablets by mouth every 4 (four) hours as needed for moderate pain. (Patient not taking: Reported on 03/18/2021) 50 tablet 0   No current facility-administered medications on file prior to visit.    There are no Patient Instructions on file for this visit. No follow-ups on file.   Kris Hartmann, NP

## 2021-05-22 ENCOUNTER — Other Ambulatory Visit (INDEPENDENT_AMBULATORY_CARE_PROVIDER_SITE_OTHER): Payer: Self-pay | Admitting: Vascular Surgery

## 2021-08-03 ENCOUNTER — Other Ambulatory Visit (INDEPENDENT_AMBULATORY_CARE_PROVIDER_SITE_OTHER): Payer: Self-pay | Admitting: Nurse Practitioner

## 2021-09-05 DIAGNOSIS — R778 Other specified abnormalities of plasma proteins: Secondary | ICD-10-CM | POA: Insufficient documentation

## 2021-09-21 ENCOUNTER — Other Ambulatory Visit (INDEPENDENT_AMBULATORY_CARE_PROVIDER_SITE_OTHER): Payer: Self-pay | Admitting: Nurse Practitioner

## 2021-09-21 DIAGNOSIS — R600 Localized edema: Secondary | ICD-10-CM | POA: Insufficient documentation

## 2021-09-22 DIAGNOSIS — S88119A Complete traumatic amputation at level between knee and ankle, unspecified lower leg, initial encounter: Secondary | ICD-10-CM | POA: Insufficient documentation

## 2021-09-22 DIAGNOSIS — D539 Nutritional anemia, unspecified: Secondary | ICD-10-CM | POA: Insufficient documentation

## 2021-09-22 DIAGNOSIS — R011 Cardiac murmur, unspecified: Secondary | ICD-10-CM | POA: Insufficient documentation

## 2021-09-23 ENCOUNTER — Other Ambulatory Visit (INDEPENDENT_AMBULATORY_CARE_PROVIDER_SITE_OTHER): Payer: Self-pay | Admitting: Nurse Practitioner

## 2021-09-23 DIAGNOSIS — I739 Peripheral vascular disease, unspecified: Secondary | ICD-10-CM

## 2021-09-23 DIAGNOSIS — I70219 Atherosclerosis of native arteries of extremities with intermittent claudication, unspecified extremity: Secondary | ICD-10-CM | POA: Insufficient documentation

## 2021-09-23 NOTE — Progress Notes (Deleted)
MRN : [id number]  William Melton is a 71 y.o. (Apr 12, 1951) male who presents with chief complaint of check up.  History of Present Illness:   The patient returns to the office for followup and review of the noninvasive studies. There have been no interval changes in lower extremity symptoms. No interval shortening of the patient's claudication distance or development of rest pain symptoms. No new ulcers or wounds have occurred since the last visit.   The patient is a left below-knee amputation and is doing well with his new prosthetic.   There have been no significant changes to the patient's overall health care.   The patient denies amaurosis fugax or recent TIA symptoms. There are no recent neurological changes noted. The patient denies history of DVT, PE or superficial thrombophlebitis. The patient denies recent episodes of angina or shortness of breath.    ABI Rt=1.13 and Lt=BKA  (previous ABI's Rt=[place] and Lt=BKA) Duplex ultrasound of the right tibial arteries reveals biphasic waveforms  No outpatient medications have been marked as taking for the 09/24/21 encounter (Appointment) with Delana Meyer, Dolores Lory, MD.    Past Medical History:  Diagnosis Date   Arthritis    CKD (chronic kidney disease), stage III ([place])    Diabetes mellitus without complication ([place])    Hyperlipidemia    Hypertension     Past Surgical History:  Procedure Laterality Date   AMPUTATION Left 08/18/2020   Procedure: PARTIAL AMPUTATION  OF 4TH AND 5TH RAY;  Surgeon: Caroline More, DPM;  Location: [facility];  Service: Podiatry;  Laterality: Left;   AMPUTATION Left 09/30/2020   Procedure: AMPUTATION BELOW KNEE;  Surgeon: Katha Cabal, MD;  Location: [facility];  Service: Vascular;  Laterality: Left;   LOWER EXTREMITY ANGIOGRAPHY Left 08/05/2020   Procedure: LOWER EXTREMITY ANGIOGRAPHY;  Surgeon: Katha Cabal, MD;  Location: [place] [facility];  Service: Cardiovascular;  Laterality: Left;   LOWER  EXTREMITY ANGIOGRAPHY Left 08/19/2020   Procedure: Lower Extremity Angiography;  Surgeon: Katha Cabal, MD;  Location: [place] [facility];  Service: Cardiovascular;  Laterality: Left;   TRANSMETATARSAL AMPUTATION Left 08/22/2020   Procedure: TRANSMETATARSAL AMPUTATION-Left Foot;  Surgeon: Samara Deist, DPM;  Location: [facility];  Service: Podiatry;  Laterality: Left;    Social History Social History   Tobacco Use   Smoking status: Former    Packs/day: 1.00    Types: Cigarettes   Smokeless tobacco: Never  Vaping Use   Vaping Use: Never used  Substance Use Topics   Alcohol use: Yes    Alcohol/week: 4.0 standard drinks    Types: 4 Cans of beer per week   Drug use: Never    Family History Family History  Problem Relation Age of Onset   Heart disease Mother    COPD Father    Hyperlipidemia Father     No Known Allergies   REVIEW OF SYSTEMS (Negative unless checked)  Constitutional: [] Weight loss  [] Fever  [] Chills Cardiac: [] Chest pain   [] Chest pressure   [] Palpitations   [] Shortness of breath when laying flat   [] Shortness of breath with exertion. Vascular:  [] Pain in legs with walking   [] Pain in legs at rest  [] History of DVT   [] Phlebitis   [] Swelling in legs   [] Varicose veins   [] Non-healing ulcers Pulmonary:   [] Uses home oxygen   [] Productive cough   [] Hemoptysis   [] Wheeze  [] COPD   [] Asthma Neurologic:  [] Dizziness   [] Seizures   [] History of  stroke   [] History of TIA  [] Aphasia   [] Vissual changes   [] Weakness or numbness in arm   [] Weakness or numbness in leg Musculoskeletal:   [] Joint swelling   [] Joint pain   [] Low back pain Hematologic:  [] Easy bruising  [] Easy bleeding   [] Hypercoagulable state   [] Anemic Gastrointestinal:  [] Diarrhea   [] Vomiting  [] Gastroesophageal reflux/heartburn   [] Difficulty swallowing. Genitourinary:  [] Chronic kidney disease   [] Difficult urination  [] Frequent urination   [] Blood in urine Skin:  [] Rashes   [] Ulcers   Psychological:  [] History of anxiety   []  History of major depression.  Physical Examination  There were no vitals filed for this visit. There is no height or weight on file to calculate BMI. Gen: WD/WN, NAD Head: College/AT, No temporalis wasting.  Ear/Nose/Throat: Hearing grossly intact, nares w/o erythema or drainage Eyes: PER, EOMI, sclera nonicteric.  Neck: Supple, no masses.  No bruit or JVD.  Pulmonary:  Good air movement, no audible wheezing, no use of accessory muscles.  Cardiac: RRR, normal S1, S2, no Murmurs. Vascular:  *** Vessel Right Left  Radial Palpable Palpable  Carotid Palpable Palpable  PT Palpable Palpable  DP Palpable Palpable  Gastrointestinal: soft, non-distended. No guarding/no peritoneal signs.  Musculoskeletal: M/S 5/5 throughout.  No visible deformity.  Neurologic: CN 2-12 intact. Pain and light touch intact in extremities.  Symmetrical.  Speech is fluent. Motor exam as listed above. Psychiatric: Judgment intact, Mood & affect appropriate for pt's clinical situation. Dermatologic: No rashes or ulcers noted.  No changes consistent with cellulitis.   CBC Lab Results  Component Value Date   WBC 17.0 (H) 10/02/2020   HGB 7.9 (L) 10/02/2020   HCT 24.2 (L) 10/02/2020   MCV 82.6 10/02/2020   PLT 371 10/02/2020    BMET    Component Value Date/Time   NA 139 10/02/2020 0318   K 3.4 (L) 10/02/2020 0318   CL 107 10/02/2020 0318   CO2 21 (L) 10/02/2020 0318   GLUCOSE 170 (H) 10/02/2020 0318   BUN 22 10/02/2020 0318   CREATININE 1.44 (H) 10/02/2020 0318   CALCIUM 8.2 (L) 10/02/2020 0318   GFRNONAA 53 (L) 10/02/2020 0318   CrCl cannot be calculated (Patient's most recent lab result is older than the maximum 21 days allowed.).  COAG Lab Results  Component Value Date   INR 1.1 09/29/2020   INR 1.2 08/16/2020    Radiology No results found.   Assessment/Plan There are no diagnoses linked to this encounter.   Hortencia Pilar, MD  09/23/2021 3:57  PM

## 2021-09-24 ENCOUNTER — Encounter (INDEPENDENT_AMBULATORY_CARE_PROVIDER_SITE_OTHER): Payer: Medicare HMO

## 2021-09-24 ENCOUNTER — Ambulatory Visit (INDEPENDENT_AMBULATORY_CARE_PROVIDER_SITE_OTHER): Payer: Medicare HMO | Admitting: Vascular Surgery

## 2021-09-24 DIAGNOSIS — E785 Hyperlipidemia, unspecified: Secondary | ICD-10-CM

## 2021-09-24 DIAGNOSIS — Z89512 Acquired absence of left leg below knee: Secondary | ICD-10-CM

## 2021-09-24 DIAGNOSIS — E1152 Type 2 diabetes mellitus with diabetic peripheral angiopathy with gangrene: Secondary | ICD-10-CM

## 2021-09-24 DIAGNOSIS — I10 Essential (primary) hypertension: Secondary | ICD-10-CM

## 2021-09-24 DIAGNOSIS — I70211 Atherosclerosis of native arteries of extremities with intermittent claudication, right leg: Secondary | ICD-10-CM

## 2021-10-12 IMAGING — DX DG CHEST 1V PORT
1 series · 1 of 1 positions shown · non-contrast
Comparison: None.

CLINICAL DATA: Questionable sepsis. Wound infection. Hx of DM, HTN,
current smoker.

EXAM:
PORTABLE CHEST 1 VIEW

[chest ap]
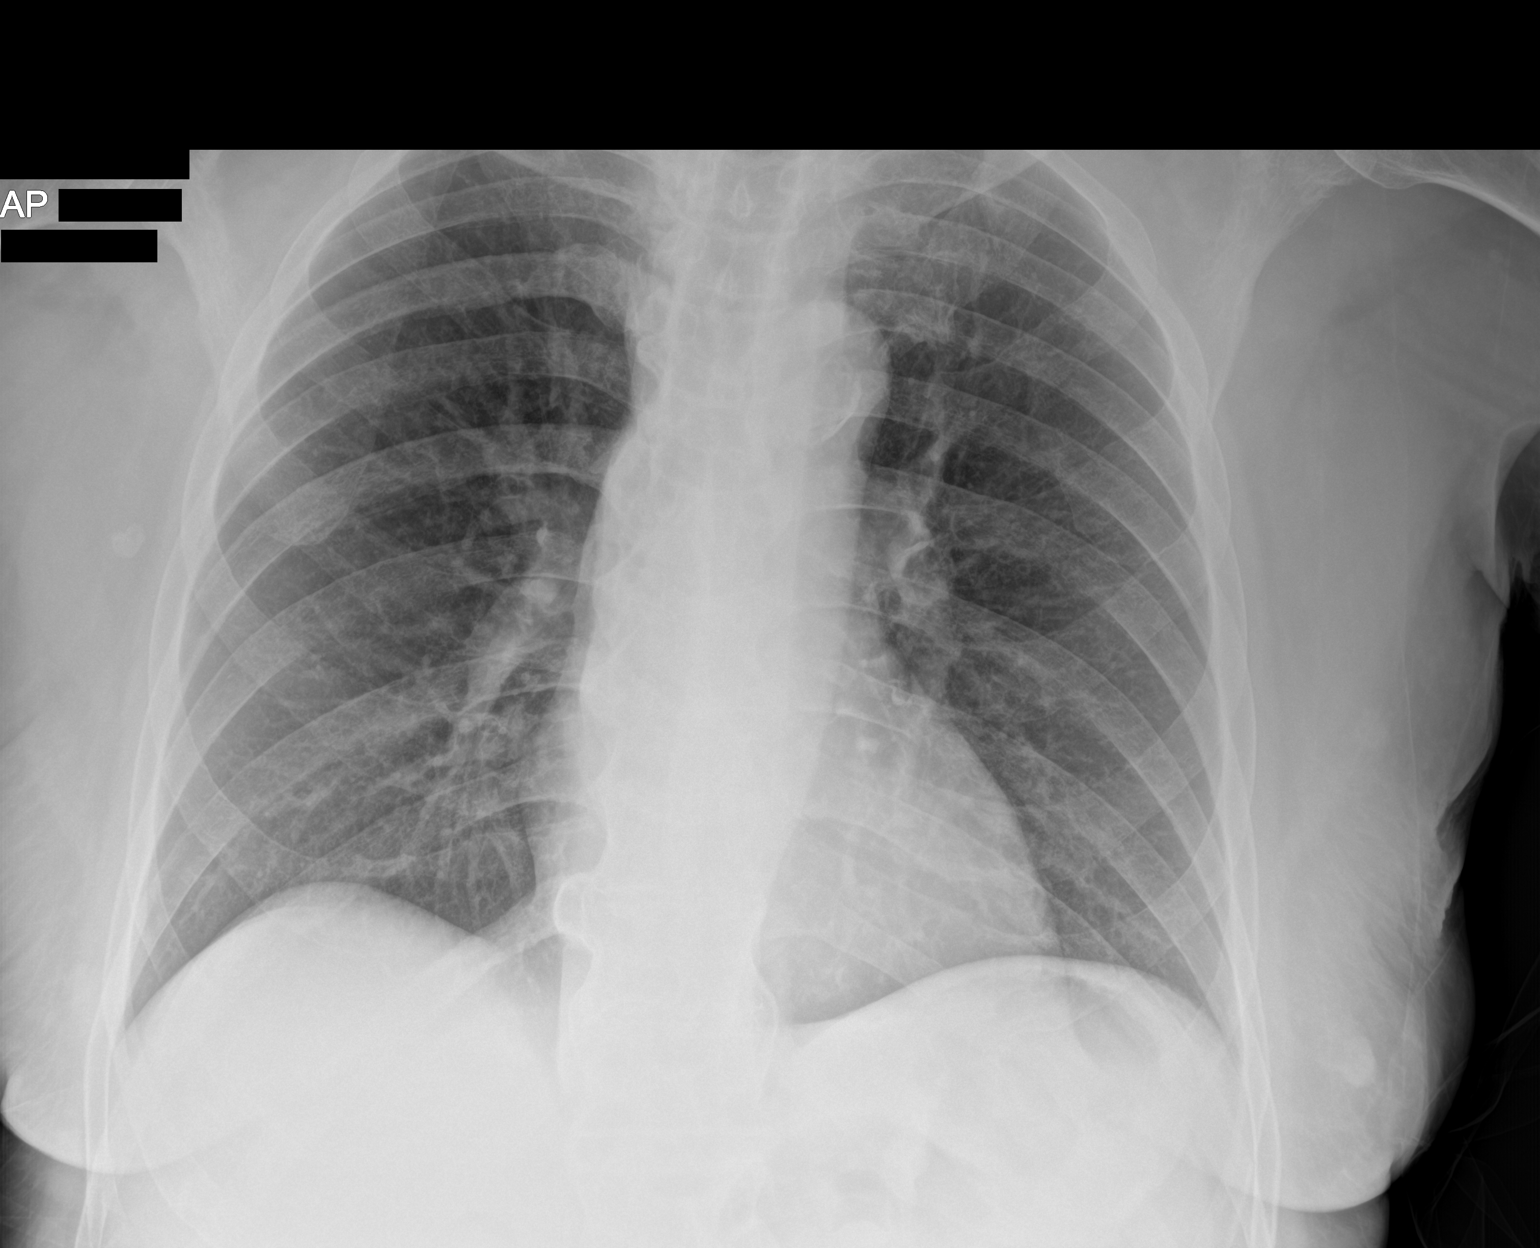

[1 of 1 positions shown; findings below may reference images not displayed]

FINDINGS: The cardiomediastinal contours are within normal limits. Aortic arch
calcification. The lungs are clear. No pneumothorax or large pleural
effusion. No acute finding in the visualized skeleton.
IMPRESSION: No evidence of active disease.

## 2021-10-12 IMAGING — CR DG FOOT COMPLETE 3+V*L*
1 series · 3 of 3 positions shown · non-contrast
Comparison: Radiograph 06/17/2020

CLINICAL DATA: Infection to left fourth and fifth toes for 1 week.
Diabetes.

EXAM:
LEFT FOOT - COMPLETE 3+ VIEW

[Series 1: dg foot complete left · 0.14mm/px · 3 of 3 slices shown]
[im 1/3]
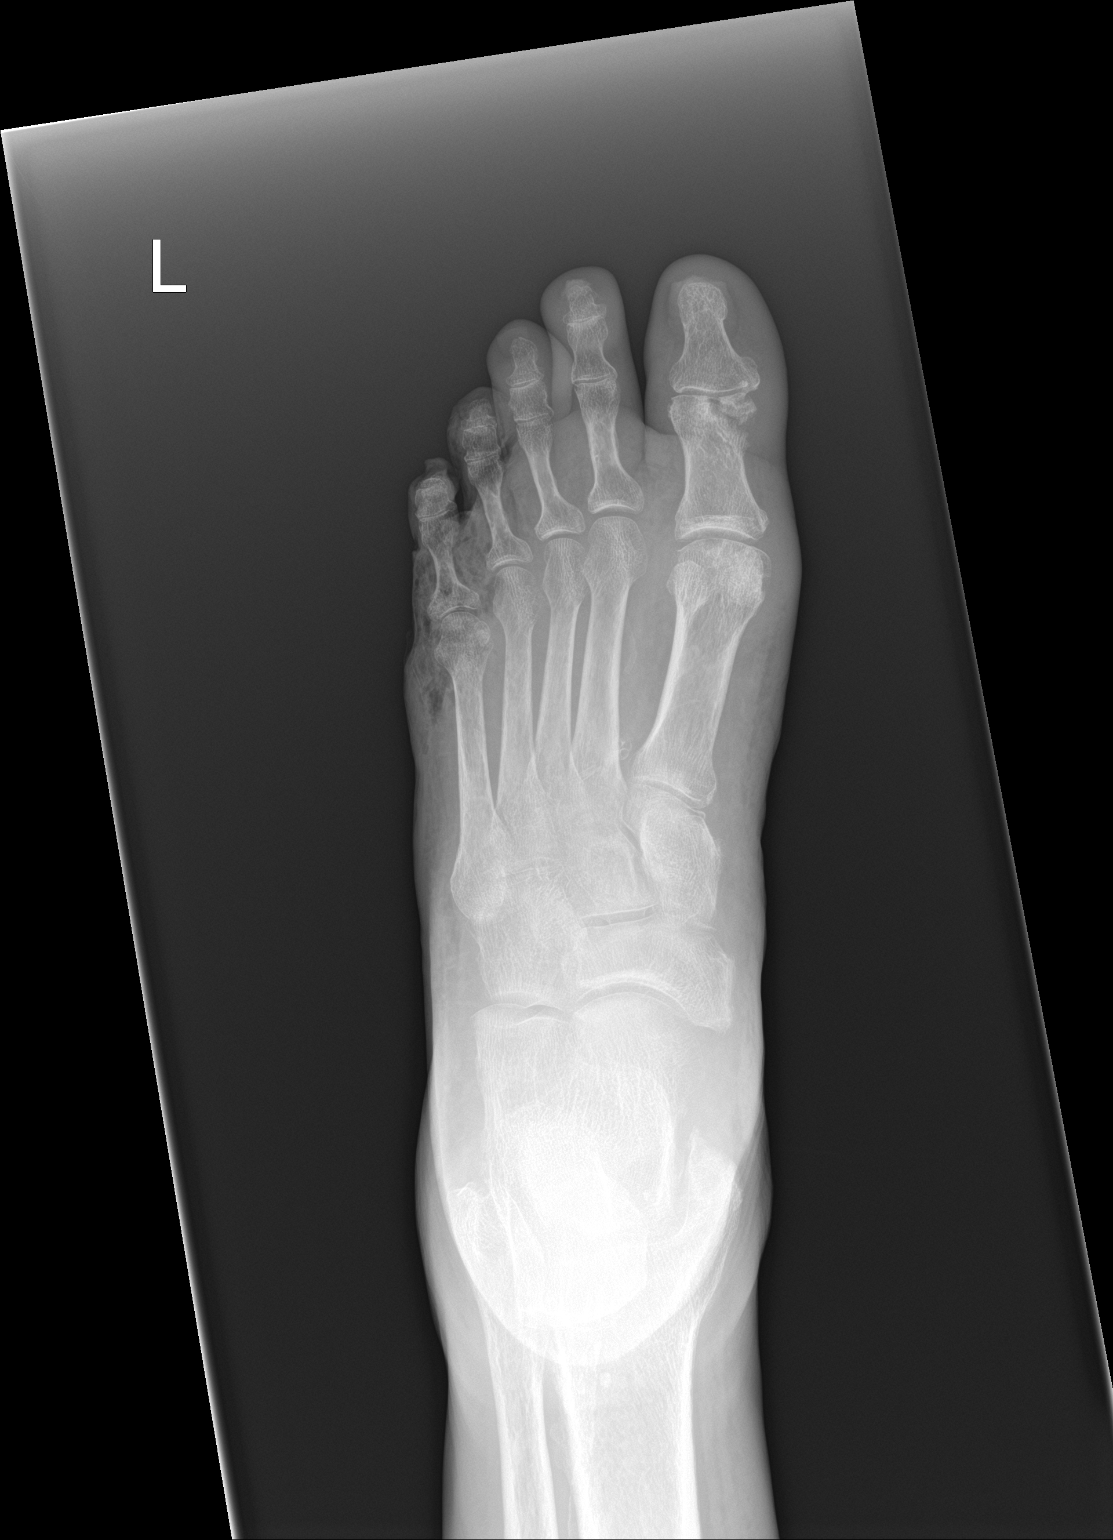
[im 2/3]
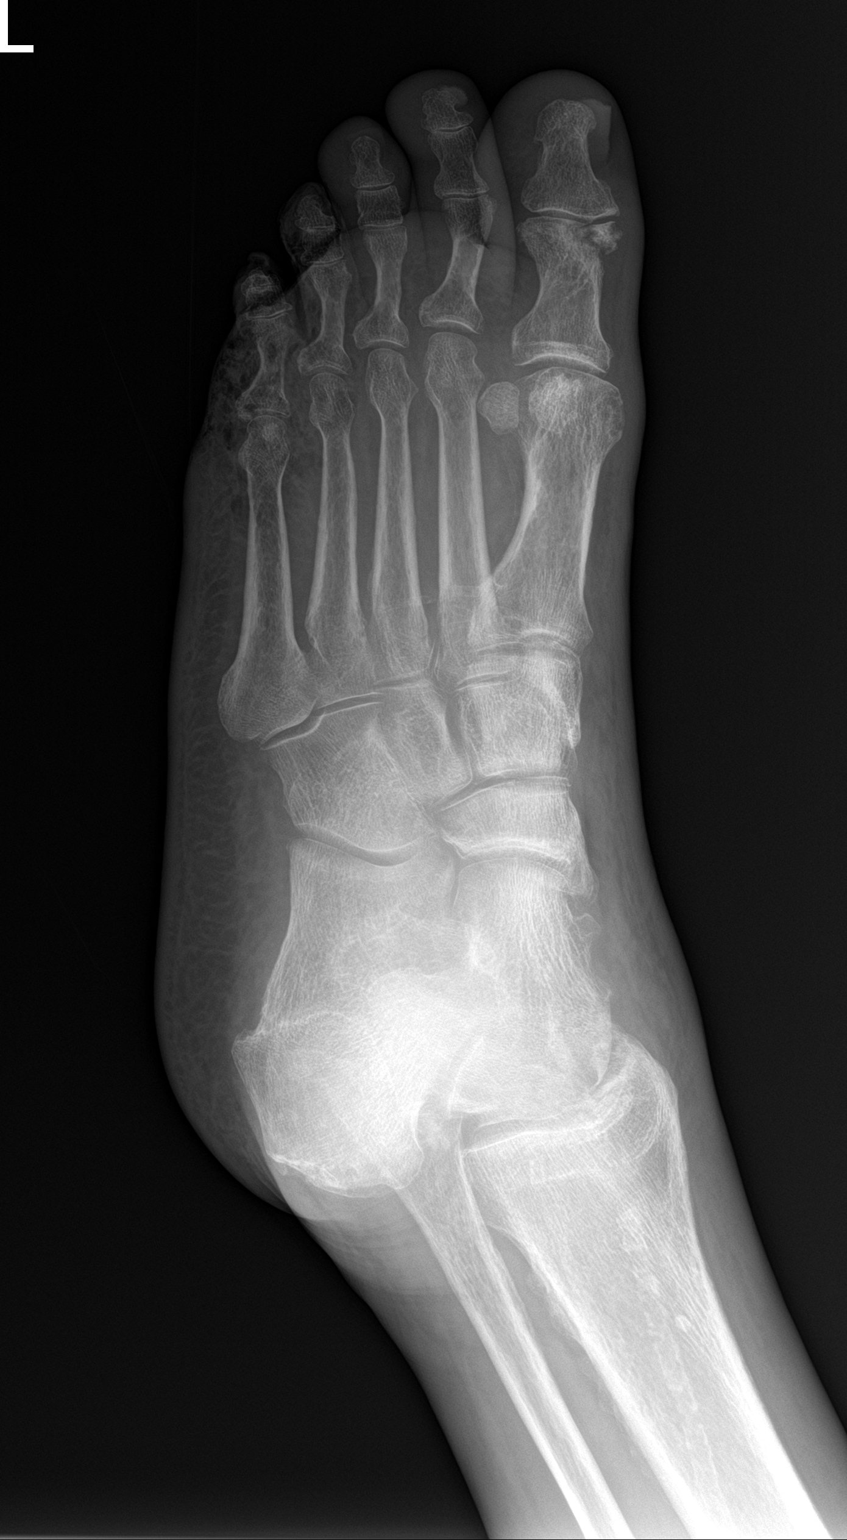
[im 3/3]
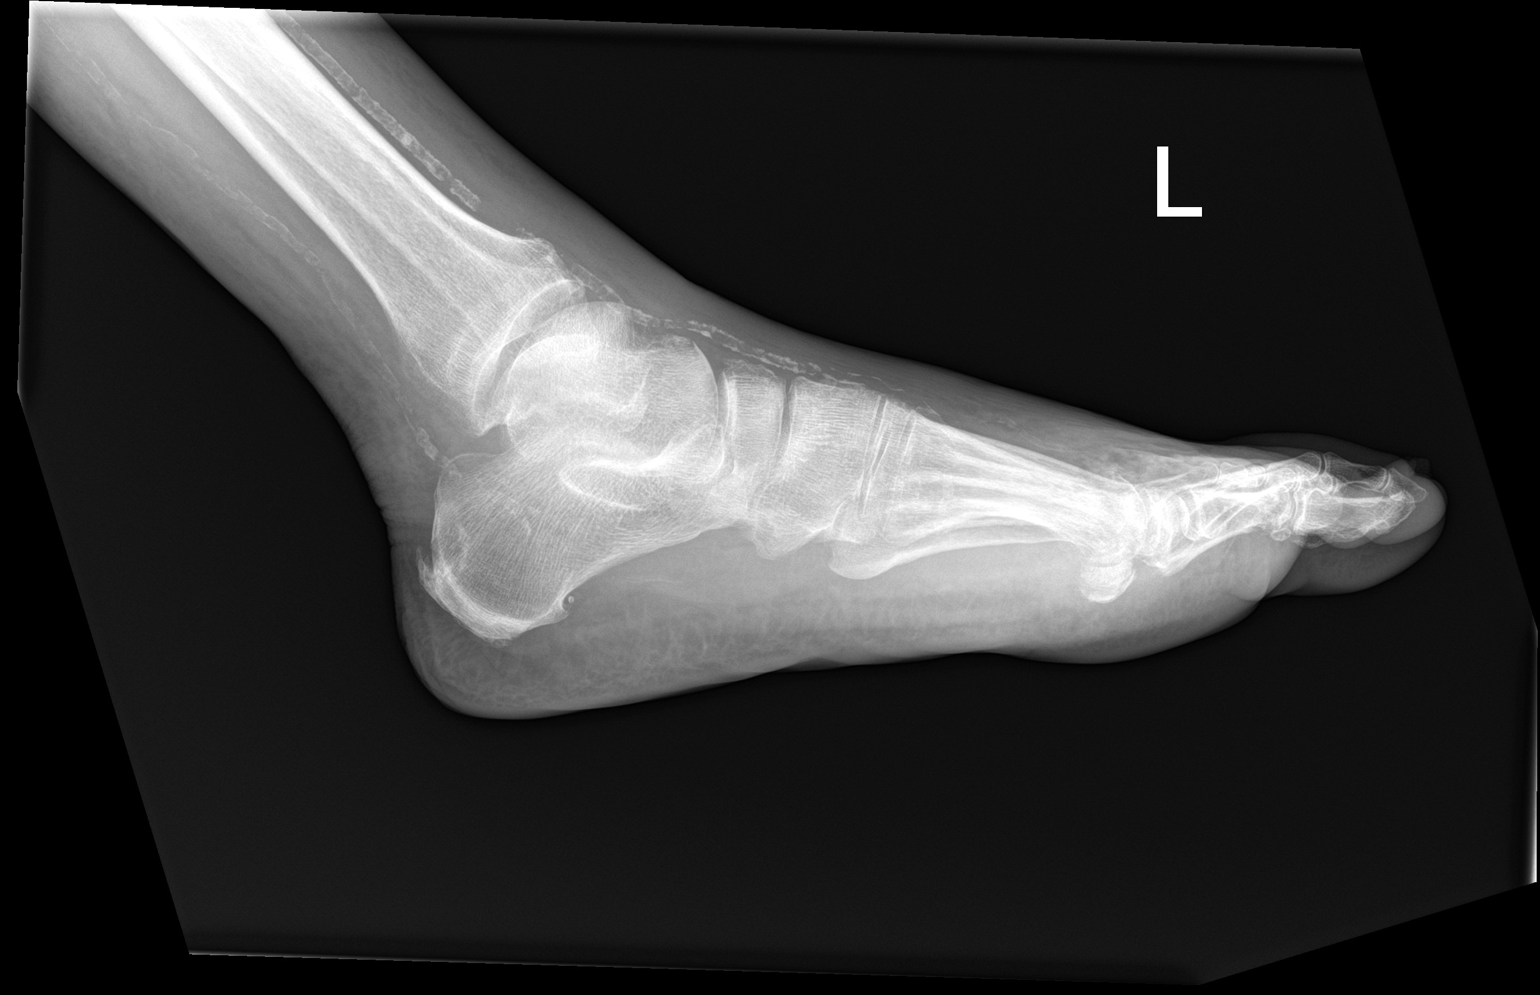

[3 of 3 positions shown; findings below may reference images not displayed]

FINDINGS: Atrophic soft tissues of the fourth and fifth digits with patchy
subcutaneous gas. Soft tissue air is also seen tracking proximally
adjacent to the distal fifth metatarsal head. Soft tissue gas
obscures detailed bony assessment, there is no gross from bony
destruction. No radiopaque foreign body.

Unchanged appearance of great toe proximal phalanx fracture with
well-defined margins and erosion involving the medial aspect. The
bones are diffusely under mineralized. No acute fracture. Plantar
calcaneal spur and Achilles tendon enthesophyte. There are vascular
calcifications.
IMPRESSION: 1. Atrophic soft tissues of the fourth and fifth digits with patchy
soft tissue air which tracks proximally adjacent to the distal fifth
metatarsal head. Findings consistent with gas gangrene. No gross
radiographic findings of osteomyelitis.
2. Unchanged appearance of the chronic great toe proximal phalanx
fracture.

## 2021-10-14 ENCOUNTER — Ambulatory Visit (INDEPENDENT_AMBULATORY_CARE_PROVIDER_SITE_OTHER): Payer: Medicare PPO | Admitting: Nurse Practitioner

## 2021-10-14 ENCOUNTER — Ambulatory Visit (INDEPENDENT_AMBULATORY_CARE_PROVIDER_SITE_OTHER): Payer: Medicare PPO

## 2021-10-14 ENCOUNTER — Other Ambulatory Visit: Payer: Self-pay

## 2021-10-14 VITALS — BP 117/66 | HR 90 | Ht 72.0 in | Wt 174.0 lb

## 2021-10-14 DIAGNOSIS — I70211 Atherosclerosis of native arteries of extremities with intermittent claudication, right leg: Secondary | ICD-10-CM | POA: Diagnosis not present

## 2021-10-14 DIAGNOSIS — I10 Essential (primary) hypertension: Secondary | ICD-10-CM

## 2021-10-14 DIAGNOSIS — Z89512 Acquired absence of left leg below knee: Secondary | ICD-10-CM

## 2021-10-14 DIAGNOSIS — I739 Peripheral vascular disease, unspecified: Secondary | ICD-10-CM

## 2021-10-14 IMAGING — DX DG FOOT 2V*L*
2 series · 2 of 2 positions shown · non-contrast
Comparison: Preoperative radiograph 08/16/2020

CLINICAL DATA: Postop left foot surgery.

EXAM:
LEFT FOOT - 2 VIEW

[foot ap]
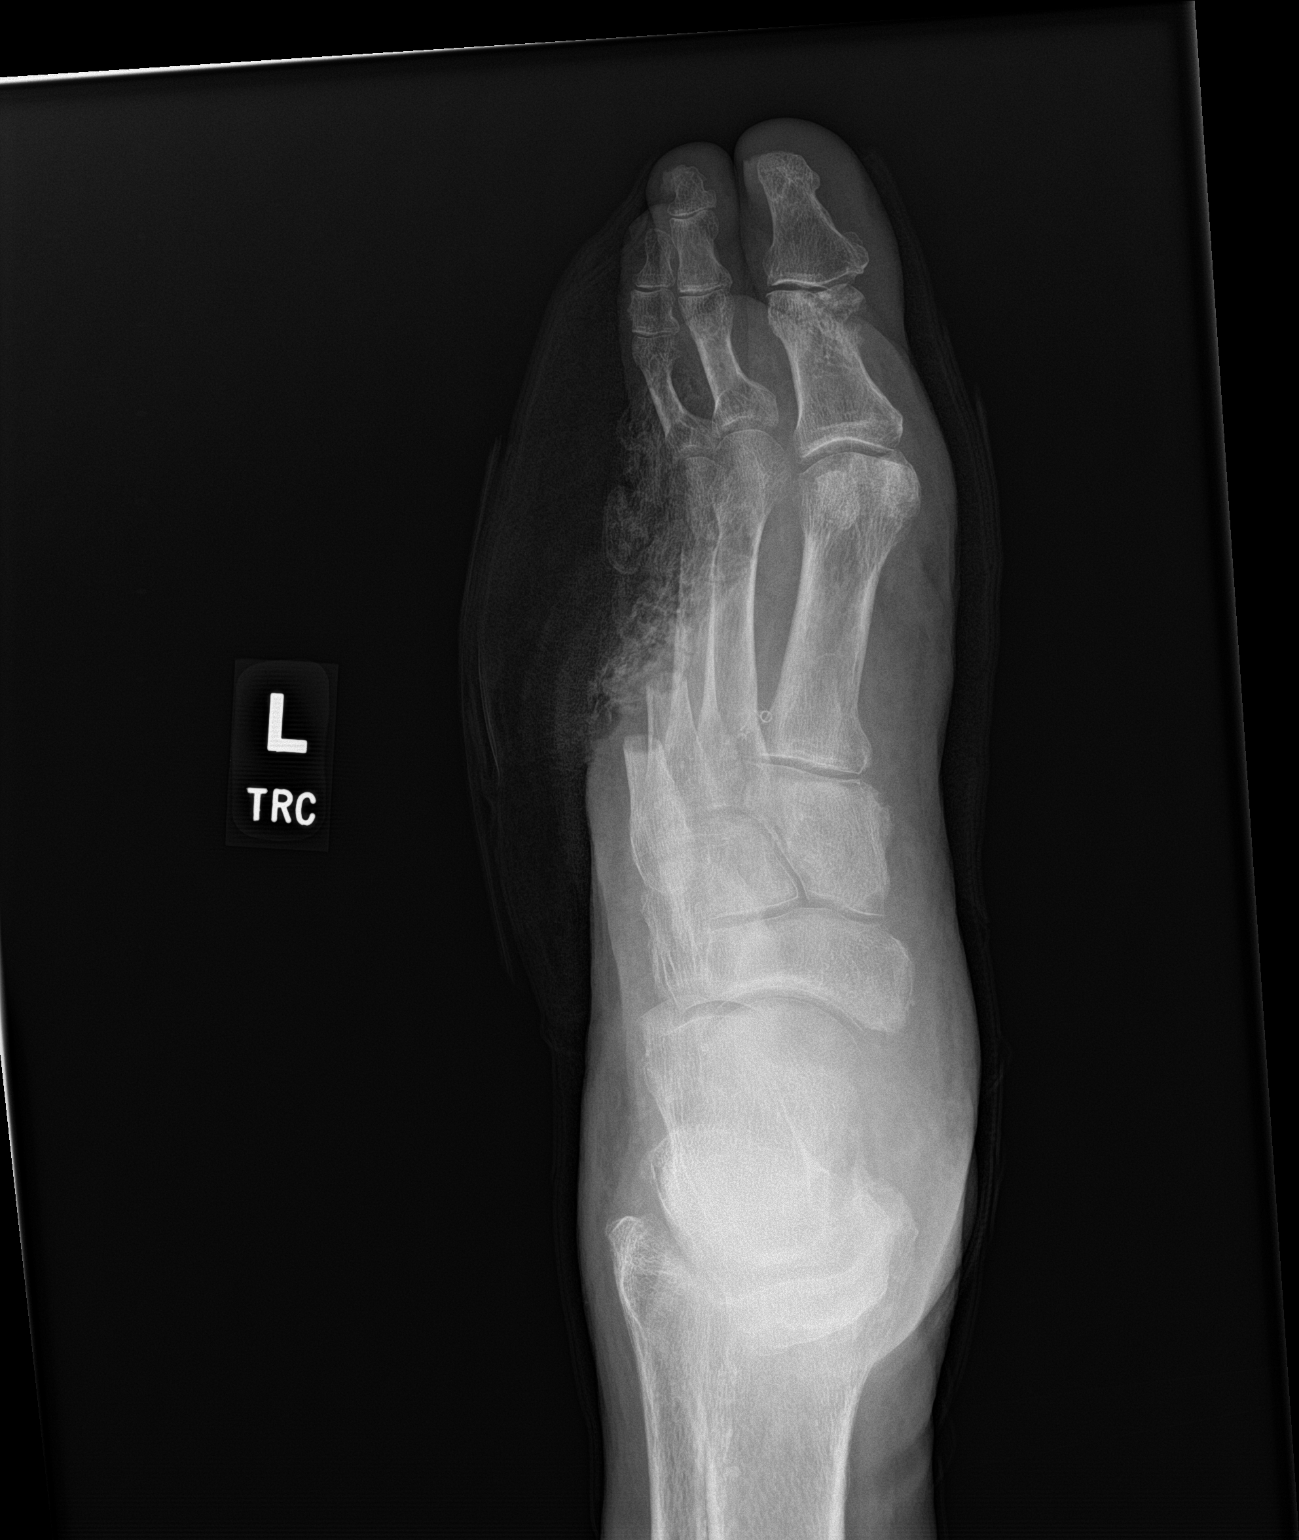

[foot lat]
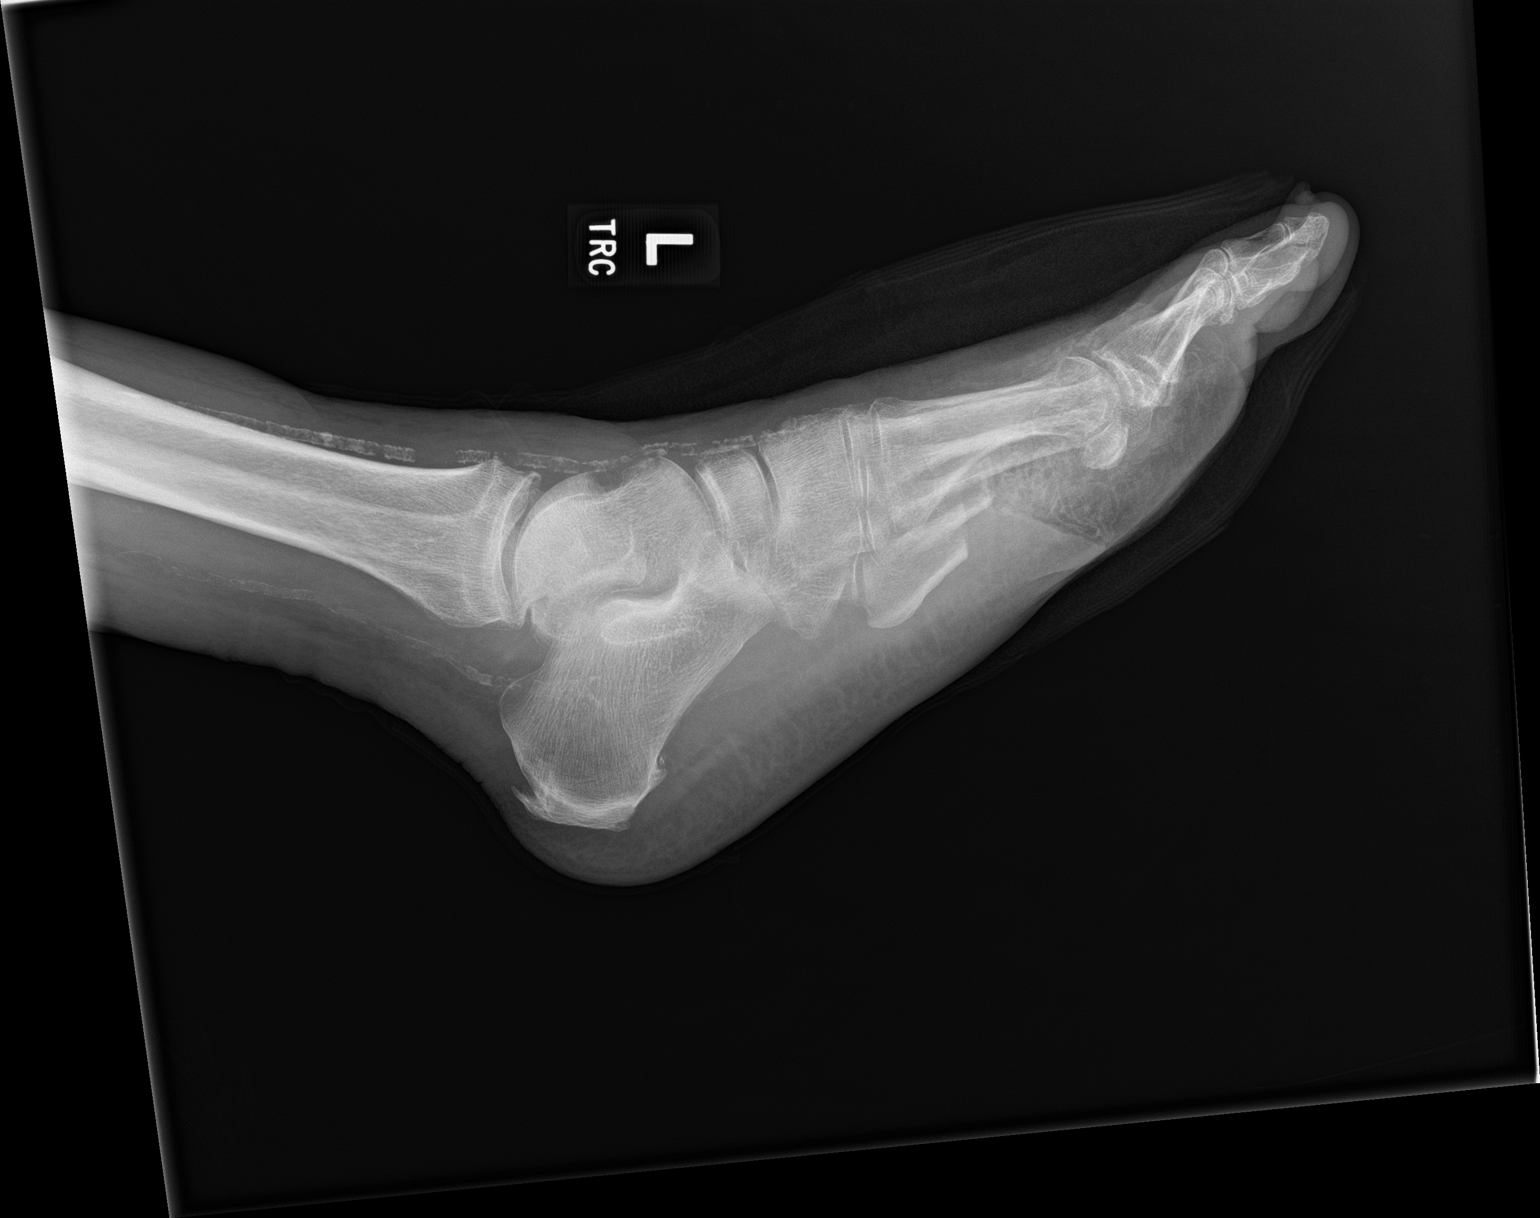

[2 of 2 positions shown; findings below may reference images not displayed]

FINDINGS: Transmetatarsal amputation of the fourth and fifth rays. Overlying
dressing in place in the operative bed. Decreased density involving
the lateral aspect of the third metatarsal head/neck may be related
to superimposed soft tissue changes, postsurgical change, or less
likely osteomyelitis. Chronic fracture of the great toe proximal
phalanx unchanged.
IMPRESSION: 1. Interval transmetatarsal amputation of the fourth and fifth rays.
2. Decreased density involving the lateral aspect of the third
metatarsal head/neck may be related to superimposed soft tissue
changes from recent surgery, postsurgical change, or less likely
osteomyelitis.

## 2021-10-19 ENCOUNTER — Encounter (INDEPENDENT_AMBULATORY_CARE_PROVIDER_SITE_OTHER): Payer: Self-pay | Admitting: Nurse Practitioner

## 2021-10-19 NOTE — Progress Notes (Signed)
Subjective:    [name], male    DOB: 07/12/1951, 71 y.o.   MRN: [id number] Chief Complaint  Patient presents with   Follow-up    6 Mo abi    William Melton is a 71 year old male that returns today for noninvasive studies regarding his peripheral arterial disease.  The patient recently had a left below-knee amputation.  He notes that he is doing well walking with his prosthetic.  He is having some discomfort and pain in his lower extremities.  He is sometimes when he is walking sometimes constantly.  He denies any open wounds or ulcerations.  He denies symptoms that are consistent with rest pain.  Today the patient has noncompressible ABIs with a TBI of 0.46.  He has biphasic waveforms in the common femoral artery as well as the SFA/popliteal.  He does have monophasic waveforms in the tibial arteries with decreased toe waveforms.   Review of Systems  Musculoskeletal:  Positive for arthralgias.  All other systems reviewed and are negative.     Objective:   Physical Exam Vitals reviewed.  HENT:     Head: Normocephalic.  Cardiovascular:     Rate and Rhythm: Normal rate.  Pulmonary:     Effort: Pulmonary effort is normal.  Skin:    General: Skin is warm and dry.  Neurological:     Mental Status: He is alert and oriented to person, place, and time.  Psychiatric:        Mood and Affect: Mood normal.        Behavior: Behavior normal.        Thought Content: Thought content normal.        Judgment: Judgment normal.    BP 117/66    Pulse 90    Ht 6' (1.829 m)    Wt 174 lb (78.9 kg)    BMI 23.60 kg/m   Past Medical History:  Diagnosis Date   Arthritis    CKD (chronic kidney disease), stage III ([facility])    Diabetes mellitus without complication ([place])    Hyperlipidemia    Hypertension     Social History   Socioeconomic History   Marital status: Widowed    Spouse name: Not on file   Number of children: 1   Years of education: Not on file   Highest education  level: Not on file  Occupational History   Occupation: Retired     Comment: Associate Professor  Tobacco Use   Smoking status: Former    Packs/day: 1.00    Types: Cigarettes   Smokeless tobacco: Never  Vaping Use   Vaping Use: Never used  Substance and Sexual Activity   Alcohol use: Yes    Alcohol/week: 4.0 standard drinks    Types: 4 Cans of beer per week   Drug use: Never   Sexual activity: Not on file  Other Topics Concern   Not on file  Social History Narrative   Currently residing at [employer] and healthcare   Social Determinants of Health   Financial Resource Strain: Not on file  Food Insecurity: Not on file  Transportation Needs: Not on file  Physical Activity: Not on file  Stress: Not on file  Social Connections: Not on file  Intimate Partner Violence: Not on file    Past Surgical History:  Procedure Laterality Date   AMPUTATION Left 08/18/2020   Procedure: PARTIAL AMPUTATION  OF 4TH AND 5TH RAY;  Surgeon: Caroline More, DPM;  Location: [facility];  Service: Podiatry;  Laterality: Left;   AMPUTATION Left 09/30/2020   Procedure: AMPUTATION BELOW KNEE;  Surgeon: Katha Cabal, MD;  Location: [facility];  Service: Vascular;  Laterality: Left;   LOWER EXTREMITY ANGIOGRAPHY Left 08/05/2020   Procedure: LOWER EXTREMITY ANGIOGRAPHY;  Surgeon: Katha Cabal, MD;  Location: [place] [facility];  Service: Cardiovascular;  Laterality: Left;   LOWER EXTREMITY ANGIOGRAPHY Left 08/19/2020   Procedure: Lower Extremity Angiography;  Surgeon: Katha Cabal, MD;  Location: [place] [facility];  Service: Cardiovascular;  Laterality: Left;   TRANSMETATARSAL AMPUTATION Left 08/22/2020   Procedure: TRANSMETATARSAL AMPUTATION-Left Foot;  Surgeon: Samara Deist, DPM;  Location: [facility];  Service: Podiatry;  Laterality: Left;    Family History  Problem Relation Age of Onset   Heart disease Mother    COPD Father    Hyperlipidemia Father     No Known  Allergies  CBC Latest Ref Rng & Units 10/02/2020 10/01/2020 10/01/2020  WBC 4.0 - 10.5 K/uL 17.0(H) 19.5(H) 17.8(H)  Hemoglobin 13.0 - 17.0 g/dL 7.9(L) 8.7(L) 9.0(L)  Hematocrit 39.0 - 52.0 % 24.2(L) 26.8(L) 27.7(L)  Platelets 150 - 400 K/uL 371 366 365      CMP     Component Value Date/Time   NA 139 10/02/2020 0318   K 3.4 (L) 10/02/2020 0318   CL 107 10/02/2020 0318   CO2 21 (L) 10/02/2020 0318   GLUCOSE 170 (H) 10/02/2020 0318   BUN 22 10/02/2020 0318   CREATININE 1.44 (H) 10/02/2020 0318   CALCIUM 8.2 (L) 10/02/2020 0318   PROT 7.8 08/16/2020 1721   ALBUMIN 1.9 (L) 08/22/2020 0443   AST 16 08/16/2020 1721   ALT 11 08/16/2020 1721   ALKPHOS 72 08/16/2020 1721   BILITOT 1.3 (H) 08/16/2020 1721   GFRNONAA 53 (L) 10/02/2020 0318     No results found.     Assessment & Plan:   1. Essential hypertension Continue antihypertensive medications as already ordered, these medications have been reviewed and there are no changes at this time.   2. Hx of BKA, left ([place]) Patient is doing well with prosthetic currently  3. Atherosclerosis of native artery of right lower extremity with intermittent claudication [facility]) The patient currently does have some pain present in his right lower extremity.  Given his history is concerning for possible PAD.  It is also possible it is related to arthritis or similar condition.  We discussed the possibility of angiogram.  However at this time the patient does not wish to move forward.  Patient is advised to discuss with primary care other possible causes of pain and discomfort.  Patient is advised that if he begins to develop wounds or signs and symptoms of ischemia they should contact us immediately.  Otherwise we will have the patient return in 3 months for noninvasive studies.   Current Outpatient Medications on File Prior to Visit  Medication Sig Dispense Refill   Alcohol Swabs 70 % PADS to check fasting blood sugar     allopurinol (ZYLOPRIM)  100 MG tablet Take 100 mg by mouth daily.      amLODipine (NORVASC) 10 MG tablet Take 10 mg by mouth daily.     Blood Glucose Monitoring Suppl (TRUE METRIX AIR GLUCOSE METER) w/Device KIT to check fasting blood sugar     clopidogrel (PLAVIX) 75 MG tablet TAKE 1 TABLET BY MOUTH ONCE DAILY. 30 tablet 0   insulin glargine (LANTUS) 100 UNIT/ML injection Inject 0.2 mLs (20 Units total)  into the skin daily. 10 mL 11   lovastatin (MEVACOR) 20 MG tablet Take 20 mg by mouth at bedtime.     metoprolol succinate (TOPROL-XL) 25 MG 24 hr tablet Take 1 tablet (25 mg total) by mouth daily. 30 tablet 0   nicotine (NICODERM CQ - DOSED IN MG/24 HOURS) 21 mg/24hr patch Place 21 mg onto the skin daily.     oxyCODONE-acetaminophen (PERCOCET/ROXICET) 5-325 MG tablet Take 1-2 tablets by mouth every 4 (four) hours as needed for moderate pain. 50 tablet 0   potassium chloride (KLOR-CON M) 10 MEQ tablet Take 10 mEq by mouth daily.     No current facility-administered medications on file prior to visit.    There are no Patient Instructions on file for this visit. No follow-ups on file.   Kris Hartmann, NP

## 2021-10-26 ENCOUNTER — Other Ambulatory Visit (INDEPENDENT_AMBULATORY_CARE_PROVIDER_SITE_OTHER): Payer: Self-pay | Admitting: Nurse Practitioner

## 2022-01-08 ENCOUNTER — Other Ambulatory Visit (INDEPENDENT_AMBULATORY_CARE_PROVIDER_SITE_OTHER): Payer: Self-pay | Admitting: Nurse Practitioner

## 2022-01-08 DIAGNOSIS — I70211 Atherosclerosis of native arteries of extremities with intermittent claudication, right leg: Secondary | ICD-10-CM

## 2022-01-11 ENCOUNTER — Encounter (INDEPENDENT_AMBULATORY_CARE_PROVIDER_SITE_OTHER): Payer: Medicare PPO

## 2022-01-11 ENCOUNTER — Ambulatory Visit (INDEPENDENT_AMBULATORY_CARE_PROVIDER_SITE_OTHER): Payer: Medicare PPO | Admitting: Vascular Surgery

## 2022-08-06 DEATH — deceased
